# Patient Record
Sex: Female | Born: 1935 | Race: White | Hispanic: No | Smoking: Former smoker
Health system: Southern US, Community
[De-identification: ages and names within clinical notes are randomized; demographics above are authoritative.]

## PROBLEM LIST (undated history)

## (undated) DIAGNOSIS — K219 Gastro-esophageal reflux disease without esophagitis: Secondary | ICD-10-CM

## (undated) DIAGNOSIS — J438 Other emphysema: Secondary | ICD-10-CM

## (undated) DIAGNOSIS — J45909 Unspecified asthma, uncomplicated: Secondary | ICD-10-CM

## (undated) DIAGNOSIS — Z923 Personal history of irradiation: Secondary | ICD-10-CM

## (undated) DIAGNOSIS — R112 Nausea with vomiting, unspecified: Secondary | ICD-10-CM

## (undated) DIAGNOSIS — Z9889 Other specified postprocedural states: Secondary | ICD-10-CM

## (undated) DIAGNOSIS — J439 Emphysema, unspecified: Secondary | ICD-10-CM

## (undated) DIAGNOSIS — Z972 Presence of dental prosthetic device (complete) (partial): Secondary | ICD-10-CM

## (undated) DIAGNOSIS — Z9981 Dependence on supplemental oxygen: Secondary | ICD-10-CM

## (undated) DIAGNOSIS — M1990 Unspecified osteoarthritis, unspecified site: Secondary | ICD-10-CM

## (undated) DIAGNOSIS — M810 Age-related osteoporosis without current pathological fracture: Secondary | ICD-10-CM

## (undated) DIAGNOSIS — I10 Essential (primary) hypertension: Secondary | ICD-10-CM

## (undated) DIAGNOSIS — F419 Anxiety disorder, unspecified: Secondary | ICD-10-CM

## (undated) DIAGNOSIS — K08109 Complete loss of teeth, unspecified cause, unspecified class: Secondary | ICD-10-CM

## (undated) DIAGNOSIS — H269 Unspecified cataract: Secondary | ICD-10-CM

## (undated) DIAGNOSIS — F32A Depression, unspecified: Secondary | ICD-10-CM

## (undated) DIAGNOSIS — T7840XA Allergy, unspecified, initial encounter: Secondary | ICD-10-CM

## (undated) DIAGNOSIS — Z973 Presence of spectacles and contact lenses: Secondary | ICD-10-CM

## (undated) DIAGNOSIS — F329 Major depressive disorder, single episode, unspecified: Secondary | ICD-10-CM

## (undated) DIAGNOSIS — C801 Malignant (primary) neoplasm, unspecified: Secondary | ICD-10-CM

## (undated) DIAGNOSIS — C50919 Malignant neoplasm of unspecified site of unspecified female breast: Secondary | ICD-10-CM

## (undated) DIAGNOSIS — E785 Hyperlipidemia, unspecified: Secondary | ICD-10-CM

## (undated) HISTORY — DX: Emphysema, unspecified: J43.9

## (undated) HISTORY — PX: OTHER SURGICAL HISTORY: SHX169

## (undated) HISTORY — DX: Gastro-esophageal reflux disease without esophagitis: K21.9

## (undated) HISTORY — DX: Unspecified osteoarthritis, unspecified site: M19.90

## (undated) HISTORY — PX: PARTIAL HYSTERECTOMY: SHX80

## (undated) HISTORY — DX: Hyperlipidemia, unspecified: E78.5

## (undated) HISTORY — PX: BREAST SURGERY: SHX581

## (undated) HISTORY — DX: Unspecified asthma, uncomplicated: J45.909

## (undated) HISTORY — PX: ABDOMINAL HYSTERECTOMY: SHX81

## (undated) HISTORY — PX: CATARACT EXTRACTION: SUR2

## (undated) HISTORY — DX: Age-related osteoporosis without current pathological fracture: M81.0

## (undated) HISTORY — DX: Malignant (primary) neoplasm, unspecified: C80.1

## (undated) HISTORY — DX: Essential (primary) hypertension: I10

## (undated) HISTORY — DX: Other emphysema: J43.8

---

## 1991-09-24 HISTORY — PX: HIP SURGERY: SHX245

## 1996-07-01 ENCOUNTER — Other Ambulatory Visit: Payer: Self-pay

## 1997-04-26 ENCOUNTER — Encounter: Payer: Self-pay | Admitting: Pulmonary Disease

## 1998-02-21 ENCOUNTER — Ambulatory Visit (HOSPITAL_COMMUNITY): Admission: RE | Admit: 1998-02-21 | Discharge: 1998-02-21 | Payer: Self-pay | Admitting: Family Medicine

## 1998-03-06 ENCOUNTER — Other Ambulatory Visit: Admission: RE | Admit: 1998-03-06 | Discharge: 1998-03-06 | Payer: Self-pay | Admitting: Family Medicine

## 1998-04-05 ENCOUNTER — Ambulatory Visit (HOSPITAL_COMMUNITY): Admission: RE | Admit: 1998-04-05 | Discharge: 1998-04-05 | Payer: Self-pay | Admitting: Family Medicine

## 1999-02-26 ENCOUNTER — Other Ambulatory Visit: Admission: RE | Admit: 1999-02-26 | Discharge: 1999-02-26 | Payer: Self-pay | Admitting: Family Medicine

## 1999-06-06 ENCOUNTER — Other Ambulatory Visit: Admission: RE | Admit: 1999-06-06 | Discharge: 1999-06-06 | Payer: Self-pay | Admitting: Gynecology

## 1999-08-29 ENCOUNTER — Other Ambulatory Visit: Admission: RE | Admit: 1999-08-29 | Discharge: 1999-08-29 | Payer: Self-pay | Admitting: Gynecology

## 1999-10-29 ENCOUNTER — Other Ambulatory Visit: Admission: RE | Admit: 1999-10-29 | Discharge: 1999-10-29 | Payer: Self-pay | Admitting: Gynecology

## 1999-10-29 ENCOUNTER — Encounter (INDEPENDENT_AMBULATORY_CARE_PROVIDER_SITE_OTHER): Payer: Self-pay | Admitting: Specialist

## 2000-02-04 ENCOUNTER — Other Ambulatory Visit: Admission: RE | Admit: 2000-02-04 | Discharge: 2000-02-04 | Payer: Self-pay | Admitting: Gynecology

## 2000-03-10 ENCOUNTER — Encounter: Payer: Self-pay | Admitting: Family Medicine

## 2000-03-10 ENCOUNTER — Encounter: Admission: RE | Admit: 2000-03-10 | Discharge: 2000-03-10 | Payer: Self-pay | Admitting: Family Medicine

## 2001-03-13 ENCOUNTER — Encounter: Payer: Self-pay | Admitting: Internal Medicine

## 2001-03-13 ENCOUNTER — Encounter: Admission: RE | Admit: 2001-03-13 | Discharge: 2001-03-13 | Payer: Self-pay | Admitting: Internal Medicine

## 2002-03-16 ENCOUNTER — Encounter: Payer: Self-pay | Admitting: Family Medicine

## 2002-03-16 ENCOUNTER — Encounter: Admission: RE | Admit: 2002-03-16 | Discharge: 2002-03-16 | Payer: Self-pay | Admitting: Family Medicine

## 2002-03-29 ENCOUNTER — Encounter: Payer: Self-pay | Admitting: Family Medicine

## 2002-03-29 ENCOUNTER — Encounter: Admission: RE | Admit: 2002-03-29 | Discharge: 2002-03-29 | Payer: Self-pay | Admitting: Family Medicine

## 2002-03-30 ENCOUNTER — Other Ambulatory Visit: Admission: RE | Admit: 2002-03-30 | Discharge: 2002-03-30 | Payer: Self-pay | Admitting: Family Medicine

## 2003-03-18 ENCOUNTER — Encounter: Admission: RE | Admit: 2003-03-18 | Discharge: 2003-03-18 | Payer: Self-pay | Admitting: Family Medicine

## 2003-03-18 ENCOUNTER — Encounter: Payer: Self-pay | Admitting: Family Medicine

## 2003-04-07 ENCOUNTER — Other Ambulatory Visit: Admission: RE | Admit: 2003-04-07 | Discharge: 2003-04-07 | Payer: Self-pay | Admitting: Family Medicine

## 2004-03-12 ENCOUNTER — Encounter: Admission: RE | Admit: 2004-03-12 | Discharge: 2004-03-12 | Payer: Self-pay | Admitting: Family Medicine

## 2004-04-09 ENCOUNTER — Other Ambulatory Visit: Admission: RE | Admit: 2004-04-09 | Discharge: 2004-04-09 | Payer: Self-pay | Admitting: Family Medicine

## 2004-07-31 ENCOUNTER — Ambulatory Visit: Payer: Self-pay

## 2005-02-07 ENCOUNTER — Ambulatory Visit: Payer: Self-pay | Admitting: Pulmonary Disease

## 2005-02-12 ENCOUNTER — Ambulatory Visit: Payer: Self-pay

## 2005-04-05 ENCOUNTER — Encounter: Admission: RE | Admit: 2005-04-05 | Discharge: 2005-04-05 | Payer: Self-pay | Admitting: Family Medicine

## 2005-04-12 ENCOUNTER — Encounter: Admission: RE | Admit: 2005-04-12 | Discharge: 2005-04-12 | Payer: Self-pay | Admitting: Family Medicine

## 2005-05-01 ENCOUNTER — Other Ambulatory Visit: Admission: RE | Admit: 2005-05-01 | Discharge: 2005-05-01 | Payer: Self-pay | Admitting: Family Medicine

## 2005-05-01 ENCOUNTER — Encounter: Payer: Self-pay | Admitting: Family Medicine

## 2005-05-01 LAB — CONVERTED CEMR LAB: Pap Smear: NORMAL

## 2006-01-10 ENCOUNTER — Ambulatory Visit: Payer: Self-pay | Admitting: Pulmonary Disease

## 2006-02-07 ENCOUNTER — Encounter: Payer: Self-pay | Admitting: Pulmonary Disease

## 2006-02-14 ENCOUNTER — Ambulatory Visit: Payer: Self-pay | Admitting: Pulmonary Disease

## 2006-04-08 ENCOUNTER — Encounter: Admission: RE | Admit: 2006-04-08 | Discharge: 2006-04-08 | Payer: Self-pay | Admitting: Family Medicine

## 2006-04-11 ENCOUNTER — Encounter: Admission: RE | Admit: 2006-04-11 | Discharge: 2006-04-11 | Payer: Self-pay | Admitting: Family Medicine

## 2006-04-11 ENCOUNTER — Encounter (INDEPENDENT_AMBULATORY_CARE_PROVIDER_SITE_OTHER): Payer: Self-pay | Admitting: *Deleted

## 2006-04-11 ENCOUNTER — Encounter (INDEPENDENT_AMBULATORY_CARE_PROVIDER_SITE_OTHER): Payer: Self-pay | Admitting: Specialist

## 2006-04-11 ENCOUNTER — Encounter (INDEPENDENT_AMBULATORY_CARE_PROVIDER_SITE_OTHER): Payer: Self-pay | Admitting: Diagnostic Radiology

## 2006-04-16 ENCOUNTER — Ambulatory Visit: Payer: Self-pay | Admitting: Family Medicine

## 2006-05-14 ENCOUNTER — Encounter (INDEPENDENT_AMBULATORY_CARE_PROVIDER_SITE_OTHER): Payer: Self-pay | Admitting: Specialist

## 2006-05-14 ENCOUNTER — Ambulatory Visit (HOSPITAL_COMMUNITY): Admission: RE | Admit: 2006-05-14 | Discharge: 2006-05-15 | Payer: Self-pay | Admitting: General Surgery

## 2006-05-14 ENCOUNTER — Encounter: Admission: RE | Admit: 2006-05-14 | Discharge: 2006-05-14 | Payer: Self-pay | Admitting: General Surgery

## 2006-05-14 HISTORY — PX: MASTECTOMY: SHX3

## 2006-05-29 ENCOUNTER — Ambulatory Visit: Admission: RE | Admit: 2006-05-29 | Discharge: 2006-08-24 | Payer: Self-pay | Admitting: *Deleted

## 2006-05-29 ENCOUNTER — Ambulatory Visit: Payer: Self-pay | Admitting: Hematology & Oncology

## 2006-06-02 LAB — CBC WITH DIFFERENTIAL/PLATELET
Basophils Absolute: 0 10*3/uL (ref 0.0–0.1)
Eosinophils Absolute: 0.1 10*3/uL (ref 0.0–0.5)
HGB: 13.4 g/dL (ref 11.6–15.9)
LYMPH%: 16.2 % (ref 14.0–48.0)
MCV: 90.5 fL (ref 81.0–101.0)
MONO%: 4.5 % (ref 0.0–13.0)
NEUT#: 6.4 10*3/uL (ref 1.5–6.5)
Platelets: 257 10*3/uL (ref 145–400)

## 2006-06-02 LAB — COMPREHENSIVE METABOLIC PANEL
Alkaline Phosphatase: 74 U/L (ref 39–117)
BUN: 15 mg/dL (ref 6–23)
Creatinine, Ser: 0.8 mg/dL (ref 0.40–1.20)
Glucose, Bld: 110 mg/dL — ABNORMAL HIGH (ref 70–99)
Total Bilirubin: 0.3 mg/dL (ref 0.3–1.2)

## 2006-07-24 ENCOUNTER — Ambulatory Visit: Payer: Self-pay | Admitting: Pulmonary Disease

## 2006-08-01 ENCOUNTER — Ambulatory Visit: Payer: Self-pay | Admitting: Internal Medicine

## 2006-08-27 ENCOUNTER — Ambulatory Visit: Payer: Self-pay | Admitting: Family Medicine

## 2006-09-01 ENCOUNTER — Ambulatory Visit: Payer: Self-pay | Admitting: Hematology & Oncology

## 2006-09-03 LAB — COMPREHENSIVE METABOLIC PANEL
AST: 19 U/L (ref 0–37)
Alkaline Phosphatase: 71 U/L (ref 39–117)
BUN: 11 mg/dL (ref 6–23)
Glucose, Bld: 92 mg/dL (ref 70–99)
Sodium: 140 mEq/L (ref 135–145)
Total Bilirubin: 0.4 mg/dL (ref 0.3–1.2)
Total Protein: 6.3 g/dL (ref 6.0–8.3)

## 2006-09-03 LAB — CBC WITH DIFFERENTIAL/PLATELET
EOS%: 1.6 % (ref 0.0–7.0)
Eosinophils Absolute: 0.1 10*3/uL (ref 0.0–0.5)
LYMPH%: 17.7 % (ref 14.0–48.0)
MCH: 30.5 pg (ref 26.0–34.0)
MCV: 90.4 fL (ref 81.0–101.0)
MONO%: 7.3 % (ref 0.0–13.0)
NEUT#: 3.9 10*3/uL (ref 1.5–6.5)
Platelets: 186 10*3/uL (ref 145–400)
RBC: 4.27 10*6/uL (ref 3.70–5.32)

## 2006-10-23 ENCOUNTER — Ambulatory Visit: Payer: Self-pay | Admitting: Pulmonary Disease

## 2006-10-27 ENCOUNTER — Ambulatory Visit: Payer: Self-pay | Admitting: Family Medicine

## 2006-12-15 ENCOUNTER — Ambulatory Visit: Payer: Self-pay | Admitting: Hematology & Oncology

## 2006-12-17 LAB — CBC WITH DIFFERENTIAL/PLATELET
BASO%: 0.3 % (ref 0.0–2.0)
EOS%: 3 % (ref 0.0–7.0)
Eosinophils Absolute: 0.2 10*3/uL (ref 0.0–0.5)
HGB: 13.3 g/dL (ref 11.6–15.9)
LYMPH%: 21.2 % (ref 14.0–48.0)
MCV: 88.7 fL (ref 81.0–101.0)
NEUT#: 3.4 10*3/uL (ref 1.5–6.5)
NEUT%: 68.1 % (ref 39.6–76.8)
Platelets: 213 10*3/uL (ref 145–400)
RDW: 13.6 % (ref 11.3–14.5)

## 2006-12-17 LAB — COMPREHENSIVE METABOLIC PANEL
BUN: 13 mg/dL (ref 6–23)
CO2: 30 mEq/L (ref 19–32)
Creatinine, Ser: 0.67 mg/dL (ref 0.40–1.20)
Glucose, Bld: 78 mg/dL (ref 70–99)
Sodium: 139 mEq/L (ref 135–145)
Total Bilirubin: 0.5 mg/dL (ref 0.3–1.2)
Total Protein: 6.8 g/dL (ref 6.0–8.3)

## 2007-02-27 ENCOUNTER — Ambulatory Visit: Payer: Self-pay | Admitting: Pulmonary Disease

## 2007-03-09 ENCOUNTER — Ambulatory Visit: Payer: Self-pay | Admitting: Hematology & Oncology

## 2007-03-11 LAB — CBC WITH DIFFERENTIAL/PLATELET
Eosinophils Absolute: 0.1 10*3/uL (ref 0.0–0.5)
HGB: 13.2 g/dL (ref 11.6–15.9)
LYMPH%: 24 % (ref 14.0–48.0)
MONO#: 0.3 10*3/uL (ref 0.1–0.9)
NEUT#: 3.4 10*3/uL (ref 1.5–6.5)
Platelets: 202 10*3/uL (ref 145–400)
RBC: 4.28 10*6/uL (ref 3.70–5.32)
WBC: 5 10*3/uL (ref 3.9–10.0)

## 2007-03-11 LAB — COMPREHENSIVE METABOLIC PANEL
Albumin: 4.1 g/dL (ref 3.5–5.2)
CO2: 28 mEq/L (ref 19–32)
Glucose, Bld: 103 mg/dL — ABNORMAL HIGH (ref 70–99)
Potassium: 4.3 mEq/L (ref 3.5–5.3)
Sodium: 143 mEq/L (ref 135–145)
Total Protein: 6.4 g/dL (ref 6.0–8.3)

## 2007-04-10 ENCOUNTER — Encounter: Payer: Self-pay | Admitting: Family Medicine

## 2007-04-10 ENCOUNTER — Encounter: Admission: RE | Admit: 2007-04-10 | Discharge: 2007-04-10 | Payer: Self-pay | Admitting: Family Medicine

## 2007-04-10 DIAGNOSIS — M810 Age-related osteoporosis without current pathological fracture: Secondary | ICD-10-CM | POA: Insufficient documentation

## 2007-04-10 DIAGNOSIS — Z853 Personal history of malignant neoplasm of breast: Secondary | ICD-10-CM

## 2007-04-10 DIAGNOSIS — K219 Gastro-esophageal reflux disease without esophagitis: Secondary | ICD-10-CM | POA: Insufficient documentation

## 2007-04-10 DIAGNOSIS — E785 Hyperlipidemia, unspecified: Secondary | ICD-10-CM | POA: Insufficient documentation

## 2007-04-10 DIAGNOSIS — J45909 Unspecified asthma, uncomplicated: Secondary | ICD-10-CM | POA: Insufficient documentation

## 2007-04-13 ENCOUNTER — Encounter (INDEPENDENT_AMBULATORY_CARE_PROVIDER_SITE_OTHER): Payer: Self-pay | Admitting: *Deleted

## 2007-04-15 ENCOUNTER — Telehealth (INDEPENDENT_AMBULATORY_CARE_PROVIDER_SITE_OTHER): Payer: Self-pay | Admitting: *Deleted

## 2007-04-15 ENCOUNTER — Inpatient Hospital Stay (HOSPITAL_COMMUNITY): Admission: EM | Admit: 2007-04-15 | Discharge: 2007-04-24 | Payer: Self-pay | Admitting: Emergency Medicine

## 2007-04-15 ENCOUNTER — Ambulatory Visit: Payer: Self-pay | Admitting: Pulmonary Disease

## 2007-04-15 ENCOUNTER — Ambulatory Visit: Payer: Self-pay | Admitting: Internal Medicine

## 2007-04-20 ENCOUNTER — Encounter: Payer: Self-pay | Admitting: Family Medicine

## 2007-04-23 ENCOUNTER — Encounter: Payer: Self-pay | Admitting: Family Medicine

## 2007-04-24 ENCOUNTER — Encounter: Payer: Self-pay | Admitting: Family Medicine

## 2007-04-25 ENCOUNTER — Encounter: Payer: Self-pay | Admitting: Family Medicine

## 2007-05-01 ENCOUNTER — Ambulatory Visit: Payer: Self-pay | Admitting: Family Medicine

## 2007-05-01 DIAGNOSIS — I10 Essential (primary) hypertension: Secondary | ICD-10-CM | POA: Insufficient documentation

## 2007-05-05 ENCOUNTER — Ambulatory Visit: Payer: Self-pay | Admitting: Pulmonary Disease

## 2007-05-18 ENCOUNTER — Encounter: Payer: Self-pay | Admitting: Family Medicine

## 2007-06-16 ENCOUNTER — Ambulatory Visit: Payer: Self-pay | Admitting: Pulmonary Disease

## 2007-07-07 ENCOUNTER — Ambulatory Visit: Payer: Self-pay | Admitting: Hematology & Oncology

## 2007-07-10 ENCOUNTER — Encounter: Payer: Self-pay | Admitting: Family Medicine

## 2007-07-10 LAB — COMPREHENSIVE METABOLIC PANEL
Albumin: 3.9 g/dL (ref 3.5–5.2)
Alkaline Phosphatase: 75 U/L (ref 39–117)
CO2: 27 mEq/L (ref 19–32)
Glucose, Bld: 69 mg/dL — ABNORMAL LOW (ref 70–99)
Potassium: 4.3 mEq/L (ref 3.5–5.3)
Sodium: 143 mEq/L (ref 135–145)
Total Protein: 6.1 g/dL (ref 6.0–8.3)

## 2007-07-10 LAB — CBC WITH DIFFERENTIAL/PLATELET
Eosinophils Absolute: 0.1 10*3/uL (ref 0.0–0.5)
MONO#: 0.4 10*3/uL (ref 0.1–0.9)
NEUT#: 4.2 10*3/uL (ref 1.5–6.5)
RBC: 4.23 10*6/uL (ref 3.70–5.32)
RDW: 13.8 % (ref 11.3–14.5)
WBC: 5.9 10*3/uL (ref 3.9–10.0)

## 2007-08-04 ENCOUNTER — Ambulatory Visit: Payer: Self-pay | Admitting: Family Medicine

## 2007-08-04 DIAGNOSIS — R030 Elevated blood-pressure reading, without diagnosis of hypertension: Secondary | ICD-10-CM | POA: Insufficient documentation

## 2007-08-07 LAB — CONVERTED CEMR LAB
Albumin: 3.6 g/dL (ref 3.5–5.2)
BUN: 8 mg/dL (ref 6–23)
Calcium: 9.4 mg/dL (ref 8.4–10.5)
Chloride: 104 meq/L (ref 96–112)
Creatinine, Ser: 0.6 mg/dL (ref 0.4–1.2)
Glucose, Bld: 88 mg/dL (ref 70–99)
HDL: 57 mg/dL (ref >39.0)
LDL Cholesterol: 73 mg/dL (ref 0–99)
Phosphorus: 3.7 mg/dL (ref 2.3–4.6)
Sodium: 140 meq/L (ref 135–145)
Triglycerides: 80 mg/dL (ref 0–149)

## 2007-10-16 ENCOUNTER — Telehealth: Payer: Self-pay | Admitting: Pulmonary Disease

## 2007-10-20 ENCOUNTER — Ambulatory Visit: Payer: Self-pay | Admitting: Pulmonary Disease

## 2007-11-04 ENCOUNTER — Ambulatory Visit: Payer: Self-pay | Admitting: Hematology & Oncology

## 2007-11-06 LAB — CBC AND DIFFERENTIAL
WBC: 6.6 10*3/mL
WBC: 6.6 10^3/mL
WBC: 6.6 10^3/mL

## 2007-11-06 LAB — CBC WITH DIFFERENTIAL/PLATELET
Basophils Absolute: 0 10*3/uL (ref 0.0–0.1)
Eosinophils Absolute: 0.1 10*3/uL (ref 0.0–0.5)
HGB: 13.2 g/dL (ref 11.6–15.9)
MCV: 88.6 fL (ref 81.0–101.0)
MONO#: 0.3 10*3/uL (ref 0.1–0.9)
NEUT#: 5.2 10*3/uL (ref 1.5–6.5)
Platelets: 203 10*3/uL (ref 145–400)
RBC: 4.68 10*6/uL (ref 3.70–5.32)
RDW: 14.2 % (ref 11.3–14.5)
WBC: 6.6 10*3/uL (ref 3.9–10.0)

## 2007-11-06 LAB — COMPREHENSIVE METABOLIC PANEL
ALT: 13 U/L (ref 0–35)
AST: 16 U/L (ref 0–37)
Albumin: 4 g/dL (ref 3.5–5.2)
Alkaline Phosphatase: 80 U/L (ref 39–117)
Glucose, Bld: 94 mg/dL (ref 70–99)
Potassium: 4.2 mEq/L (ref 3.5–5.3)
Sodium: 141 mEq/L (ref 135–145)
Total Bilirubin: 0.4 mg/dL (ref 0.3–1.2)
Total Protein: 6.3 g/dL (ref 6.0–8.3)

## 2007-12-28 ENCOUNTER — Telehealth: Payer: Self-pay | Admitting: Family Medicine

## 2008-03-09 ENCOUNTER — Ambulatory Visit: Payer: Self-pay | Admitting: Pulmonary Disease

## 2008-03-09 DIAGNOSIS — J439 Emphysema, unspecified: Secondary | ICD-10-CM | POA: Insufficient documentation

## 2008-04-03 IMAGING — CR DG CHEST 2V
2 series · 2 of 2 positions shown · non-contrast
Comparison: 04/22/07.

CLINICAL DATA: COPD.
 CHEST - 2 VIEW:

[view not recorded (1 of 2)]
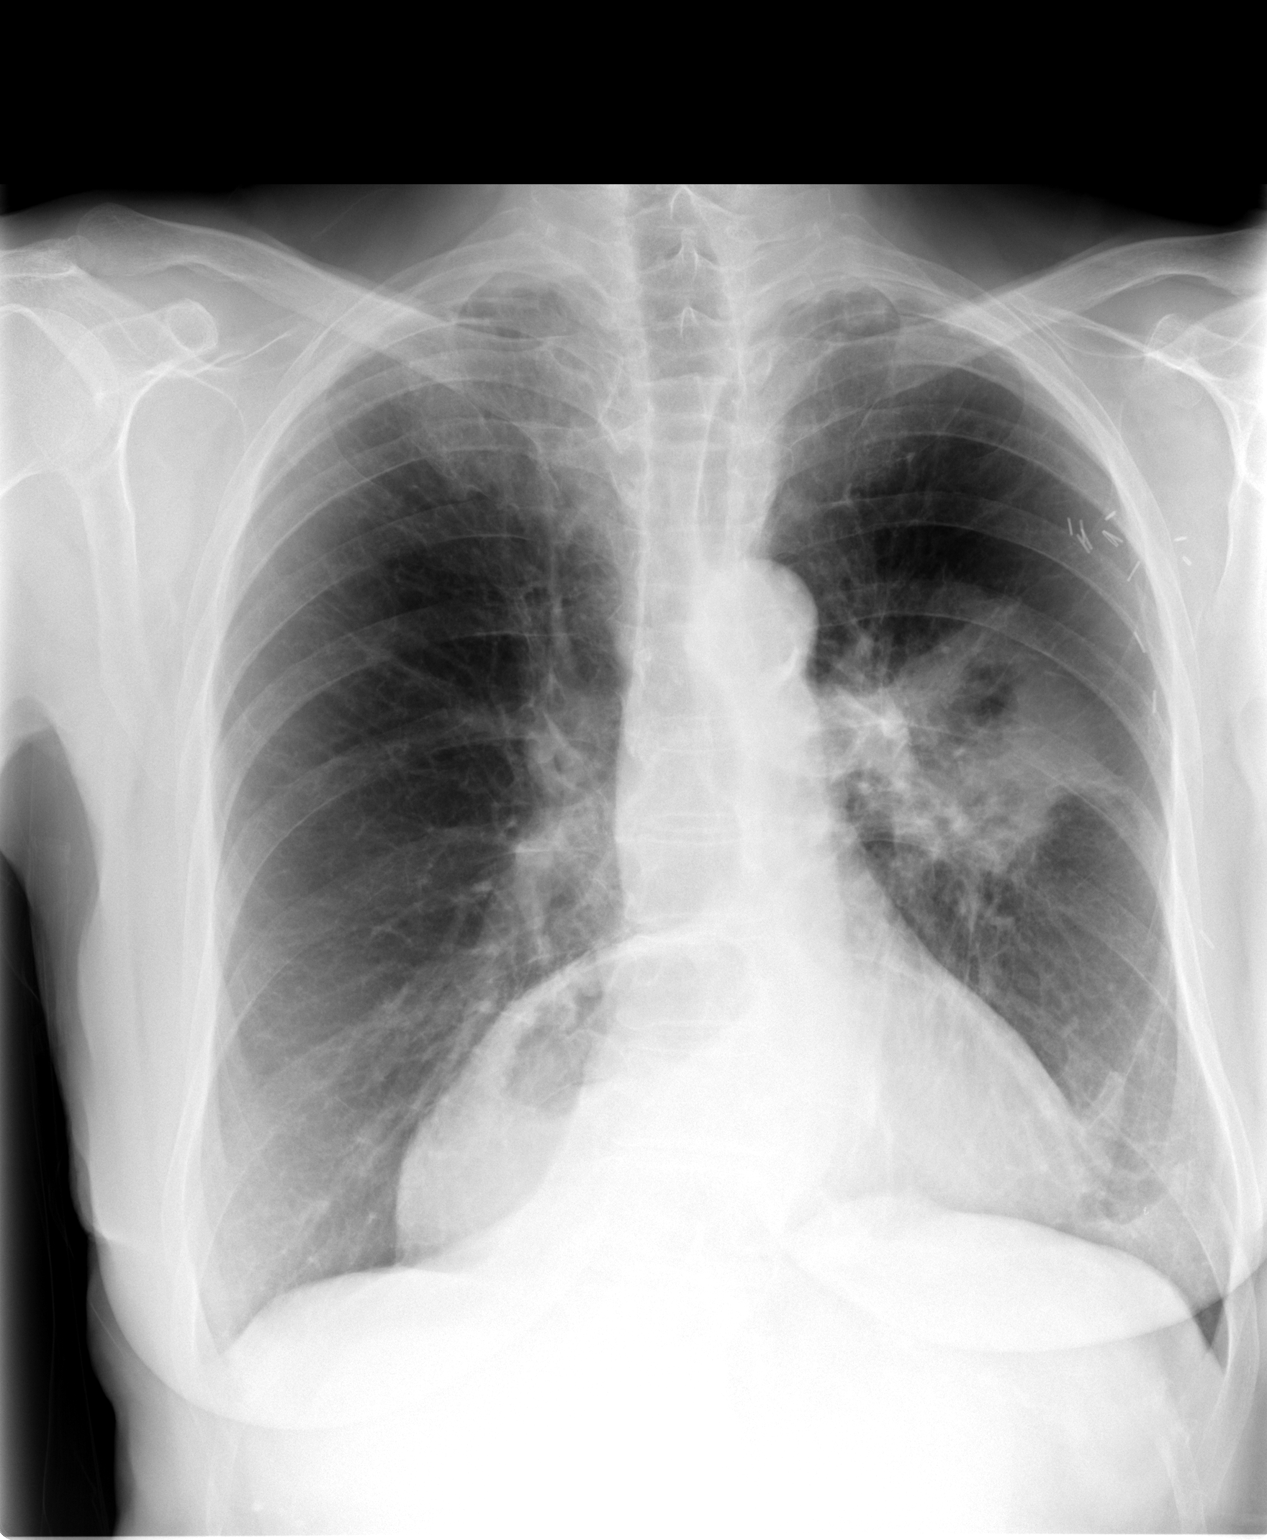

[view not recorded (2 of 2)]
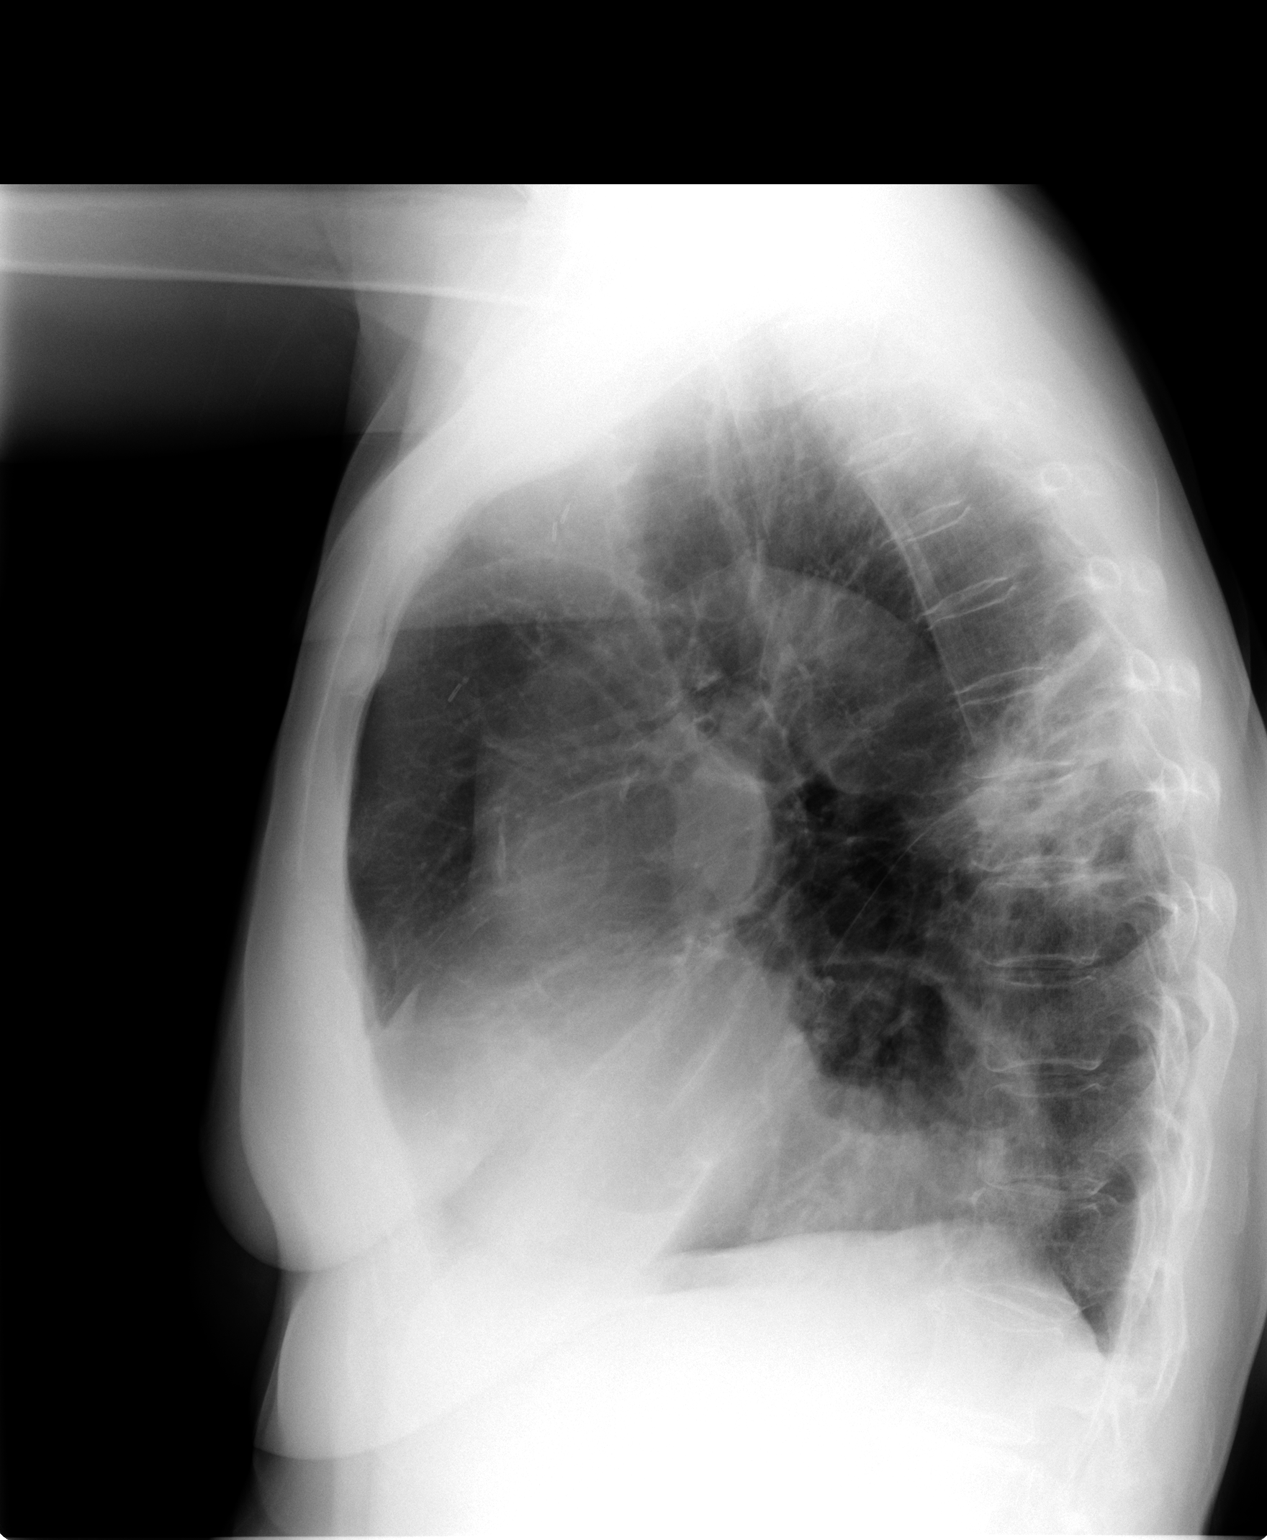

[2 of 2 positions shown; findings below may reference images not displayed]

FINDINGS: Cavitary infiltrate in the superior segment of the left lower lobe is again noted.  The cavity is more prominent and the infiltrate is slightly improved.   The infiltrate in the left lower lobe basilar segment in the posterior lung base has resolved in the interval.  There is no significant effusion.  There is chronic lung disease. Intrathoracic stomach is again noted.
IMPRESSION: Improvement in left lower lobe infiltrate since the prior study. The infiltrate in the superior segment of the left lower lobe shows slightly more cavitation.

## 2008-04-12 ENCOUNTER — Encounter: Admission: RE | Admit: 2008-04-12 | Discharge: 2008-04-12 | Payer: Self-pay | Admitting: Family Medicine

## 2008-04-21 ENCOUNTER — Telehealth (INDEPENDENT_AMBULATORY_CARE_PROVIDER_SITE_OTHER): Payer: Self-pay | Admitting: *Deleted

## 2008-04-22 ENCOUNTER — Ambulatory Visit: Payer: Self-pay | Admitting: Internal Medicine

## 2008-04-22 DIAGNOSIS — J209 Acute bronchitis, unspecified: Secondary | ICD-10-CM

## 2008-05-04 ENCOUNTER — Ambulatory Visit: Payer: Self-pay | Admitting: Hematology & Oncology

## 2008-05-06 LAB — CBC WITH DIFFERENTIAL (CANCER CENTER ONLY)
BASO#: 0 10*3/uL (ref 0.0–0.2)
BASO%: 0.3 % (ref 0.0–2.0)
HCT: 38.4 % (ref 34.8–46.6)
HGB: 13.2 g/dL (ref 11.6–15.9)
LYMPH%: 19.3 % (ref 14.0–48.0)
MCV: 89 fL (ref 81–101)
MONO#: 0.4 10*3/uL (ref 0.1–0.9)
NEUT%: 72.1 % (ref 39.6–80.0)
RDW: 12.7 % (ref 10.5–14.6)
WBC: 7.1 10*3/uL (ref 3.9–10.0)

## 2008-05-06 LAB — COMPREHENSIVE METABOLIC PANEL
Alkaline Phosphatase: 75 U/L (ref 39–117)
BUN: 9 mg/dL (ref 6–23)
CO2: 28 mEq/L (ref 19–32)
Glucose, Bld: 98 mg/dL (ref 70–99)
Sodium: 141 mEq/L (ref 135–145)
Total Bilirubin: 0.4 mg/dL (ref 0.3–1.2)
Total Protein: 6.4 g/dL (ref 6.0–8.3)

## 2008-05-23 ENCOUNTER — Telehealth (INDEPENDENT_AMBULATORY_CARE_PROVIDER_SITE_OTHER): Payer: Self-pay | Admitting: *Deleted

## 2008-06-03 ENCOUNTER — Encounter: Payer: Self-pay | Admitting: Pulmonary Disease

## 2008-06-12 ENCOUNTER — Encounter: Payer: Self-pay | Admitting: Pulmonary Disease

## 2008-06-15 ENCOUNTER — Ambulatory Visit: Payer: Self-pay | Admitting: Pulmonary Disease

## 2008-09-08 ENCOUNTER — Ambulatory Visit: Payer: Self-pay | Admitting: Pulmonary Disease

## 2008-09-26 ENCOUNTER — Telehealth (INDEPENDENT_AMBULATORY_CARE_PROVIDER_SITE_OTHER): Payer: Self-pay | Admitting: *Deleted

## 2008-10-14 ENCOUNTER — Ambulatory Visit: Payer: Self-pay | Admitting: Pulmonary Disease

## 2008-10-14 DIAGNOSIS — J690 Pneumonitis due to inhalation of food and vomit: Secondary | ICD-10-CM | POA: Insufficient documentation

## 2008-10-14 DIAGNOSIS — R059 Cough, unspecified: Secondary | ICD-10-CM | POA: Insufficient documentation

## 2008-10-14 DIAGNOSIS — R05 Cough: Secondary | ICD-10-CM

## 2008-11-02 ENCOUNTER — Ambulatory Visit: Payer: Self-pay | Admitting: Hematology & Oncology

## 2009-03-14 ENCOUNTER — Ambulatory Visit: Payer: Self-pay | Admitting: Pulmonary Disease

## 2009-04-14 ENCOUNTER — Encounter: Admission: RE | Admit: 2009-04-14 | Discharge: 2009-04-14 | Payer: Self-pay | Admitting: Internal Medicine

## 2009-05-09 ENCOUNTER — Ambulatory Visit: Payer: Self-pay | Admitting: Hematology & Oncology

## 2009-05-10 LAB — CBC WITH DIFFERENTIAL (CANCER CENTER ONLY)
Eosinophils Absolute: 0.2 10*3/uL (ref 0.0–0.5)
HCT: 38.1 % (ref 34.8–46.6)
LYMPH%: 26.1 % (ref 14.0–48.0)
MCV: 89 fL (ref 81–101)
MONO#: 0.3 10*3/uL (ref 0.1–0.9)
NEUT%: 65.1 % (ref 39.6–80.0)
RBC: 4.27 10*6/uL (ref 3.70–5.32)
WBC: 5.4 10*3/uL (ref 3.9–10.0)

## 2009-05-11 LAB — COMPREHENSIVE METABOLIC PANEL
ALT: 16 U/L (ref 0–35)
AST: 17 U/L (ref 0–37)
Albumin: 4 g/dL (ref 3.5–5.2)
Alkaline Phosphatase: 67 U/L (ref 39–117)
Potassium: 4.3 mEq/L (ref 3.5–5.3)
Sodium: 138 mEq/L (ref 135–145)
Total Bilirubin: 0.4 mg/dL (ref 0.3–1.2)
Total Protein: 6.2 g/dL (ref 6.0–8.3)

## 2009-06-15 ENCOUNTER — Telehealth: Payer: Self-pay | Admitting: Pulmonary Disease

## 2009-07-20 ENCOUNTER — Ambulatory Visit: Payer: Self-pay | Admitting: Pulmonary Disease

## 2009-07-20 DIAGNOSIS — J441 Chronic obstructive pulmonary disease with (acute) exacerbation: Secondary | ICD-10-CM

## 2009-09-12 ENCOUNTER — Ambulatory Visit: Payer: Self-pay | Admitting: Pulmonary Disease

## 2009-11-07 ENCOUNTER — Ambulatory Visit: Payer: Self-pay | Admitting: Hematology & Oncology

## 2009-11-09 LAB — COMPREHENSIVE METABOLIC PANEL
ALT: 19 U/L (ref 0–35)
AST: 20 U/L (ref 0–37)
Albumin: 4.2 g/dL (ref 3.5–5.2)
CO2: 28 mEq/L (ref 19–32)
Calcium: 9 mg/dL (ref 8.4–10.5)
Chloride: 104 mEq/L (ref 96–112)
Potassium: 4.1 mEq/L (ref 3.5–5.3)
Total Protein: 6.3 g/dL (ref 6.0–8.3)

## 2009-11-09 LAB — CBC WITH DIFFERENTIAL (CANCER CENTER ONLY)
BASO%: 0.2 % (ref 0.0–2.0)
EOS%: 2.2 % (ref 0.0–7.0)
HGB: 13.5 g/dL (ref 11.6–15.9)
LYMPH#: 1.4 10*3/uL (ref 0.9–3.3)
MCHC: 33.4 g/dL (ref 32.0–36.0)
MONO#: 0.2 10*3/uL (ref 0.1–0.9)
NEUT#: 5.4 10*3/uL (ref 1.5–6.5)
RDW: 11.7 % (ref 10.5–14.6)
WBC: 7.2 10*3/uL (ref 3.9–10.0)

## 2010-01-16 ENCOUNTER — Ambulatory Visit: Payer: Self-pay | Admitting: Pulmonary Disease

## 2010-03-22 ENCOUNTER — Ambulatory Visit: Payer: Self-pay | Admitting: Pulmonary Disease

## 2010-03-29 ENCOUNTER — Telehealth (INDEPENDENT_AMBULATORY_CARE_PROVIDER_SITE_OTHER): Payer: Self-pay | Admitting: *Deleted

## 2010-03-30 ENCOUNTER — Ambulatory Visit: Payer: Self-pay | Admitting: Pulmonary Disease

## 2010-03-30 DIAGNOSIS — R0902 Hypoxemia: Secondary | ICD-10-CM | POA: Insufficient documentation

## 2010-04-09 ENCOUNTER — Ambulatory Visit: Payer: Self-pay | Admitting: Hematology & Oncology

## 2010-04-10 ENCOUNTER — Telehealth: Payer: Self-pay | Admitting: Pulmonary Disease

## 2010-05-03 LAB — VITAMIN D 25 HYDROXY (VIT D DEFICIENCY, FRACTURES): Vit D, 25-Hydroxy: 73 ng/mL (ref 30–89)

## 2010-05-03 LAB — CBC WITH DIFFERENTIAL (CANCER CENTER ONLY)
BASO%: 0.5 % (ref 0.0–2.0)
EOS%: 3 % (ref 0.0–7.0)
Eosinophils Absolute: 0.2 10*3/uL (ref 0.0–0.5)
HCT: 41.2 % (ref 34.8–46.6)
HGB: 13.8 g/dL (ref 11.6–15.9)
LYMPH#: 1.5 10*3/uL (ref 0.9–3.3)
LYMPH%: 27.5 % (ref 14.0–48.0)
MCV: 90 fL (ref 81–101)
MONO%: 5.5 % (ref 0.0–13.0)
NEUT%: 63.5 % (ref 39.6–80.0)
Platelets: 250 10*3/uL (ref 145–400)
RBC: 4.58 10*6/uL (ref 3.70–5.32)
RDW: 11.8 % (ref 10.5–14.6)

## 2010-05-03 LAB — COMPREHENSIVE METABOLIC PANEL
ALT: 16 U/L (ref 0–35)
BUN: 13 mg/dL (ref 6–23)
Chloride: 103 mEq/L (ref 96–112)
Creatinine, Ser: 0.74 mg/dL (ref 0.40–1.20)
Glucose, Bld: 87 mg/dL (ref 70–99)
Potassium: 4.2 mEq/L (ref 3.5–5.3)
Total Protein: 6.7 g/dL (ref 6.0–8.3)

## 2010-05-09 ENCOUNTER — Encounter: Admission: RE | Admit: 2010-05-09 | Discharge: 2010-05-09 | Payer: Self-pay | Admitting: Internal Medicine

## 2010-05-24 ENCOUNTER — Encounter (HOSPITAL_COMMUNITY): Admission: RE | Admit: 2010-05-24 | Discharge: 2010-06-22 | Payer: Self-pay | Admitting: Pulmonary Disease

## 2010-06-13 ENCOUNTER — Ambulatory Visit: Payer: Self-pay | Admitting: Pulmonary Disease

## 2010-06-23 ENCOUNTER — Encounter (HOSPITAL_COMMUNITY)
Admission: RE | Admit: 2010-06-23 | Discharge: 2010-09-21 | Payer: Self-pay | Source: Home / Self Care | Attending: Pulmonary Disease | Admitting: Pulmonary Disease

## 2010-07-17 ENCOUNTER — Telehealth (INDEPENDENT_AMBULATORY_CARE_PROVIDER_SITE_OTHER): Payer: Self-pay | Admitting: *Deleted

## 2010-07-23 ENCOUNTER — Telehealth (INDEPENDENT_AMBULATORY_CARE_PROVIDER_SITE_OTHER): Payer: Self-pay | Admitting: *Deleted

## 2010-08-03 ENCOUNTER — Telehealth (INDEPENDENT_AMBULATORY_CARE_PROVIDER_SITE_OTHER): Payer: Self-pay | Admitting: *Deleted

## 2010-08-20 ENCOUNTER — Telehealth: Payer: Self-pay | Admitting: Pulmonary Disease

## 2010-09-20 ENCOUNTER — Ambulatory Visit: Payer: Self-pay | Admitting: Pulmonary Disease

## 2010-09-28 ENCOUNTER — Encounter: Payer: Self-pay | Admitting: Pulmonary Disease

## 2010-10-23 NOTE — Assessment & Plan Note (Signed)
Summary: flu shot/mhh  Nurse Visit   Allergies: 1)  * Avapro 2)  * Codiene  Immunizations Administered:  Influenza Vaccine # 1:    Vaccine Type: Fluvax MCR    Site: right deltoid    Mfr: GlaxoSmithKline    Dose: 0.5 ml    Route: IM    Given by: Kandice Hams CMA    Exp. Date: 03/23/2011    Lot #: [id number]    VIS given: 04/17/10 version given June 13, 2010.  Flu Vaccine Consent Questions:    Do you have a history of severe allergic reactions to this vaccine? no    Any prior history of allergic reactions to egg and/or gelatin? no    Do you have a sensitivity to the preservative Thimersol? no    Do you have a past history of Guillan-Barre Syndrome? no    Do you currently have an acute febrile illness? no    Have you ever had a severe reaction to latex? no    Vaccine information given and explained to patient? yes    Are you currently pregnant? no  Orders Added: 1)  Influenza Vaccine MCR [00025]

## 2010-10-23 NOTE — Progress Notes (Signed)
Summary: note for excercise  Phone Note Call from Patient Call back at Home Phone [phone]   Caller: Patient Call For: clance Summary of Call: pt needs a note on a rx pad stating that it's ok for her to use stationery bike as well as treadmill and other excercise equip at [facility]. pls mail to her home address.  Initial call taken by: Tivis Ringer, CNA,  July 23, 2010 9:11 AM  Follow-up for Phone Call        LMTCBx1 to get more info on exactly what rx needs to say. Carron Curie CMA  July 23, 2010 10:04 AM   returned call.Lacinda Axon  July 23, 2010 1:26 PM  LMTCBx2. Carron Curie CMA  July 23, 2010 2:15 PM  called spoke with patient who states that she was told by [facility] that the city lawyer advised them to get a written rx stating that pt may use the equipment at their facility.  she states that she does not need help paying for the monthly dues, but they will not let her use their gym without this.  KC, is this okay to write for pt?  would like rx mailed to her verified home address.  no call back necessary unless not okay with KC.   Follow-up by: Boone Master CNA/MA,  July 23, 2010 2:26 PM  Additional Follow-up for Phone Call Additional follow up Details #1::        done and put in bin Additional Follow-up by: [name] MD,  July 23, 2010 5:37 PM    Additional Follow-up for Phone Call Additional follow up Details #2::    Spoke with pt and notified note is done.  Note was mailed to her- home address was verified.  Follow-up by: Vernie Murders,  July 23, 2010 5:46 PM

## 2010-10-23 NOTE — Assessment & Plan Note (Signed)
Summary: green mucus, SOB / KC pt / cj   Primary Provider/Referring Provider:  Eula Listen  CC:  Acute visit. Dr. Shelle Iron patient. The patient c/o increased sob with exertion and sometimes at rest. Cough with green mucus x3 days.Marland Kitchen  History of Present Illness: 77 yo female with Severe COPD/Emphysema for acute visit.  She was seen by Dr. Shelle Iron on June 30, and was in her usual state of health.  A few days after this visit she started getting more cough and chest congestion.  She has been bringing up green sputum.  She denies hemoptysis or fever.  She has been getting occasional wheezing, and feels it is hard to get air into her lungs at times.  She denies sinus congestion, but has been getting headaches.  She is using her nebulizer more, and now using two times a day.  She does not use her proventil.  She doesn't think this helps much.  She denies skin rash, abdominal pain, gland swelling, or leg swelling.   Preventive Screening-Counseling & Management  Alcohol-Tobacco     Smoking Status: quit     Year Quit: 1997     Pack years: 23yrs, 1.5ppd  Current Medications (verified): 1)  Omeprazole 20 Mg  Cpdr (Omeprazole) .... Take One By Mouth Daily 2)  Lipitor 10 Mg  Tabs (Atorvastatin Calcium) .... Take One By Mouth Daily 3)  Altace 10 Mg  Tabs (Ramipril) .... Take One By Mouth Daily 4)  Spiriva Handihaler 18 Mcg  Caps (Tiotropium Bromide Monohydrate) .... Inhale Contents of 1 Capsule Once A Day 5)  Adult Aspirin Low Strength 81 Mg  Tbdp (Aspirin) .... Take One By Mouth Daily 6)  Caltrate 600+d Plus 600-400 Mg-Unit  Tabs (Calcium Carbonate-Vit D-Min) .... One By Mouth Qd 7)  Advair Diskus 250-50 Mcg/dose Misc (Fluticasone-Salmeterol) .... Inhale 1 Puff As Directed Twice A Day 8)  Proventil Hfa 108 (90 Base) Mcg/act  Aers (Albuterol Sulfate) .... Inhale 2 Puffs Every 4 To 6 Hours As Needed 9)  Albuterol Sulfate (2.5 Mg/93ml) 0.083% Nebu (Albuterol Sulfate) .Marland Kitchen.. 1 Vial in Nebulizer Every 6 Hours As  Needed 10)  Fish Oil 300 Mg Caps (Omega-3 Fatty Acids) .... Take 1 Tablet By Mouth Once A Day 11)  Amlodipine Besylate 5 Mg Tabs (Amlodipine Besylate) .... Take 1 Tablet By Mouth Once A Day 12)  Vitamin D 1000 Unit Tabs (Cholecalciferol) .... Take 1 Tablet By Mouth Once A Day  Allergies (verified): 1)  * Avapro 2)  * Codiene  Past History:  Past Medical History: Reviewed history from 04/22/2008 and no changes required. EMPHYSEMA (ICD-492.8) HYPERTENSION, WHITE COAT (ICD-796.2) HYPERTENSION, ESSENTIAL NOS (ICD-401.9) OSTEOPOROSIS (ICD-733.00) HYPERLIPIDEMIA (ICD-272.4) GERD (ICD-530.81) BREAST CANCER, HX OF (ICD-V10.3) ASTHMA (ICD-493.90)    Past Surgical History: Reviewed history from 05/01/2007 and no changes required. Cataract extraction- bilateral (2002) Hysterectomy- partial, fibroids- transfused (1982) Mastectomy- left partial, lymph node dissection (04/2006) Hip fracture- surgery (1994) Colonoscopy- hemorrhoids (2001) Dexa hosp pneumonia 7/08  Vital Signs:  Patient profile:   77 year old female Height:      66 inches (167.64 cm) Weight:      150 pounds (68.18 kg) BMI:     24.30 O2 Sat:      86 % on Room air Temp:     98.5 degrees F (36.94 degrees C) oral Pulse rate:   106 / minute BP sitting:   112 / 70  (right arm) Cuff size:   regular  Vitals Entered By: Michel Bickers CMA (March 30, 2010 3:14 PM)  O2 Sat at Rest %:  86 O2 Flow:  Room air  O2 Sat Comments Patients sats were at 86% on room air upon walking from the lobby to the exam room. After resting for 1 minute her sats came up to 91% on room air.Michel Bickers CMA  March 30, 2010 3:15 PM CC: Acute visit. Dr. Shelle Iron patient. The patient c/o increased sob with exertion and sometimes at rest. Cough with green mucus x3 days. Comments Medications reviewed. Daytime phone verified. Michel Bickers CMA  March 30, 2010 3:15 PM   Physical Exam  Nose:  no sinus tenderness, no drainage Mouth:  mild erythema, no  exudate Neck:  no JVD.   Lungs:  prolonged exhalation, b/l faint wheeze, scattered rhonchi, no rales Heart:  regular rhythm, normal rate, and no murmurs.   Abdomen:  thin, soft, non-tender Extremities:  no edema Cervical Nodes:  no significant adenopathy   Impression & Recommendations:  Problem # 1:  CHRONIC OBSTRUCTIVE PULMONARY DISEASE, ACUTE EXACERBATION (ICD-491.21) Will give 7 days of levaquin and prednisone taper.  She is to call if her symptoms get worse, or go to the ER.  Problem # 2:  HYPOXEMIA (ICD-799.02) Explained to her that she may need supplemental oxygen with exertion and at night.  She believes her breathing will be fine once her bronchitis clears up, and she does not believe she will need oxygen.  She is to f/u with Dr. Shelle Iron to discuss further.  I explained to her the reason for using supplemental oxygen, and how chronic hypoxemia (even if intermittent) can have detrimental health effects.  Medications Added to Medication List This Visit: 1)  Prednisone 10 Mg Tabs (Prednisone) .... 3 pills for 2 days, 2 pills for 2 days, 1 pill for 2 days, 1/2 pill for 2 days 2)  Levaquin 750 Mg Tabs (Levofloxacin) .... One by mouth once daily  Complete Medication List: 1)  Omeprazole 20 Mg Cpdr (Omeprazole) .... Take one by mouth daily 2)  Lipitor 10 Mg Tabs (Atorvastatin calcium) .... Take one by mouth daily 3)  Altace 10 Mg Tabs (Ramipril) .... Take one by mouth daily 4)  Spiriva Handihaler 18 Mcg Caps (Tiotropium bromide monohydrate) .... Inhale contents of 1 capsule once a day 5)  Adult Aspirin Low Strength 81 Mg Tbdp (Aspirin) .... Take one by mouth daily 6)  Caltrate 600+d Plus 600-400 Mg-unit Tabs (Calcium carbonate-vit d-min) .... One by mouth qd 7)  Advair Diskus 250-50 Mcg/dose Misc (Fluticasone-salmeterol) .... Inhale 1 puff as directed twice a day 8)  Proventil Hfa 108 (90 Base) Mcg/act Aers (Albuterol sulfate) .... Inhale 2 puffs every 4 to 6 hours as needed 9)   Albuterol Sulfate (2.5 Mg/31ml) 0.083% Nebu (Albuterol sulfate) .Marland Kitchen.. 1 vial in nebulizer every 6 hours as needed 10)  Fish Oil 300 Mg Caps (Omega-3 fatty acids) .... Take 1 tablet by mouth once a day 11)  Amlodipine Besylate 5 Mg Tabs (Amlodipine besylate) .... Take 1 tablet by mouth once a day 12)  Vitamin D 1000 Unit Tabs (Cholecalciferol) .... Take 1 tablet by mouth once a day 13)  Prednisone 10 Mg Tabs (Prednisone) .... 3 pills for 2 days, 2 pills for 2 days, 1 pill for 2 days, 1/2 pill for 2 days 14)  Levaquin 750 Mg Tabs (Levofloxacin) .... One by mouth once daily  Other Orders: Est. Patient Level IV ([address])  Patient Instructions: 1)  Levaquin 750 mg once daily for 7 days  2)  Prednisone 10 mg pills: 3 pills for 2 days, 2 pills for 2 days, 1 pill for 2 days, 1/2 pill for 2 days 3)  Follow up with Dr. Shelle Iron in 4 to 6 weeks Prescriptions: LEVAQUIN 750 MG TABS (LEVOFLOXACIN) one by mouth once daily  #7 x 0   Entered and Authorized by:   Coralyn Helling MD   Signed by:   Coralyn Helling MD on 03/30/2010   Method used:   [facility]  [place] [id number]       [address]       [place], [place]  [address]       Ph: [phone] or [phone]       Fax: [phone]   RxID:   [id number] PREDNISONE 10 MG TABS (PREDNISONE) 3 pills for 2 days, 2 pills for 2 days, 1 pill for 2 days, 1/2 pill for 2 days  #13 x 0   Entered and Authorized by:   Coralyn Helling MD   Signed by:   Coralyn Helling MD on 03/30/2010   Method used:   [facility]        [facility]  [place] ([facility]ail[facility]       [address]       [place], [place]  [address]       Ph: [phone] or [phone]       Fax: [phone]   RxID:   [id number]

## 2010-10-23 NOTE — Progress Notes (Signed)
Summary: ORDER to pulm rehab  Phone Note Call from Patient   Caller: Patient Call For: [facility] Summary of Call: NEED ORDER FAXED TO PULMONARY REHAB BEFORE PT CAN START. Initial call taken by: Rickard Patience,  April 10, 2010 1:21 PM  Follow-up for Phone Call        called and spoke with pt.  pt states when she last saw [facility] on 03-22-2010 he mentioned her trying pulmonary rehab. (see patient instructions for that OV) Pt would like to try pulm rehab and [facility].  Will forward message to [facility] so he can send order to [facility].  Arman Filter LPN  April 10, 2010 1:57 PM   Additional Follow-up for Phone Call Additional follow up Details #1::        order sent to [facility] Additional Follow-up by: [name] MD,  April 12, 2010 8:50 AM

## 2010-10-23 NOTE — Progress Notes (Signed)
Summary: pulmonary rehab issue   Phone Note Call from Patient Call back at Home Phone [phone]   Caller: Patient Call For: clance Summary of Call: Pt states she went to pulmonary in Sept and Oct wants to quit, re: no money and her car is being repaired, wants to know if it's ok to stop, pls advise. Initial call taken by: Darletta Moll,  July 17, 2010 3:59 PM  Follow-up for Phone Call        called spoke with patient who states that she has been to pulm rehab x2 months (sept and oct) and simply cannot afford to go for the 3rd month of november d/t vehicle repairs, the upcoming holidays, etc.  pt states that she has bought an oximeter and plans to implement the therapies she learned at rehab in her local senior center.  would like to have KC's thoughts on this, preferably before Thursday when she is scheduled to go to rehab again.  KC please advise, thanks! Follow-up by: Boone Master CNA/MA,  July 17, 2010 4:12 PM  Additional Follow-up for Phone Call Additional follow up Details #1::        I support her approach if this is a financial issue for her. Additional Follow-up by: [name] MD,  July 19, 2010 10:37 AM    Additional Follow-up for Phone Call Additional follow up Details #2::    LMTCBx1 to advise per [facility] ok to stop rehab and do exercise at senior center. Carron Curie CMA  July 19, 2010 10:44 AM pt returned call from triage. she is at home now. Tivis Ringer, CNA  July 19, 2010 2:21 PM  Spoke with pt and notified okay per [facility] to stop pulm rehab and exercise at the senior center. Pt verbalized understanding. Follow-up by: Vernie Murders,  July 19, 2010 2:24 PM

## 2010-10-23 NOTE — Progress Notes (Signed)
Summary: appt  Phone Note Call from Patient Call back at Home Phone [phone]   Caller: Patient Call For: clance Summary of Call: Coughing up green phlegm since yesterday, feels worse today, wants to see [facility] tomorrow pls advise. Initial call taken by: Darletta Moll,  March 29, 2010 3:50 PM  Follow-up for Phone Call        Called, spoke with pt.  Pt c/o "spitting up green" mucus, increased SOB, HA, and "not feeling good all over" x 1 1/2 days.  Requesting to be seen tomorrow.  KC has no openings-ov scheduled with VS for tomorrow at 315-pt aware.  Gweneth Dimitri RN  March 29, 2010 4:02 PM  Follow-up by: Gweneth Dimitri RN,  March 29, 2010 4:02 PM

## 2010-10-23 NOTE — Assessment & Plan Note (Signed)
Summary: acute sick visit for copd exacerbation   Primary Provider/Referring Provider:  Eula Listen  CC:  Emphysema.   The patient c/o cough with green mucus and increased sob with exertion and at rest x5 days. She is using her nebulizer more often.Marland Kitchen  History of Present Illness: the pt comes in today for an acute sick visit.  She has known severe emphysema, and gives a 5 day h/o increasing chest congestion, cough with purulent mucus, wheezing, and worsening sob.  She denies any f/c/s.  She had been doing well on her current regimen until this time.  Since being sick, she has had to use her rescue nebulizer treatments more often.  Current Medications (verified): 1)  Omeprazole 20 Mg  Cpdr (Omeprazole) .... Take One By Mouth Daily 2)  Lipitor 10 Mg  Tabs (Atorvastatin Calcium) .... Take One By Mouth Daily 3)  Altace 10 Mg  Tabs (Ramipril) .... Take One By Mouth Daily 4)  Spiriva Handihaler 18 Mcg  Caps (Tiotropium Bromide Monohydrate) .... Inhale Contents of 1 Capsule Once A Day 5)  Adult Aspirin Low Strength 81 Mg  Tbdp (Aspirin) .... Take One By Mouth Daily 6)  Caltrate 600+d Plus 600-400 Mg-Unit  Tabs (Calcium Carbonate-Vit D-Min) .... One By Mouth Qd 7)  Advair Diskus 250-50 Mcg/dose Misc (Fluticasone-Salmeterol) .... Inhale 1 Puff As Directed Twice A Day 8)  Proventil Hfa 108 (90 Base) Mcg/act  Aers (Albuterol Sulfate) .... Inhale 2 Puffs Every 4 To 6 Hours As Needed 9)  Albuterol Sulfate (2.5 Mg/20ml) 0.083% Nebu (Albuterol Sulfate) .Marland Kitchen.. 1 Vial in Nebulizer Every 6 Hours As Needed 10)  Fish Oil 300 Mg Caps (Omega-3 Fatty Acids) .... Take 1 Tablet By Mouth Once A Day 11)  Amlodipine Besylate 5 Mg Tabs (Amlodipine Besylate) .... Take 1 Tablet By Mouth Once A Day 12)  Vitamin D 1000 Unit Tabs (Cholecalciferol) .... Take 1 Tablet By Mouth Once A Day  Allergies (verified): 1)  * Avapro 2)  * Codiene  Past History:  Past Medical History: Reviewed history from 04/22/2008 and no changes  required. EMPHYSEMA (ICD-492.8) HYPERTENSION, WHITE COAT (ICD-796.2) HYPERTENSION, ESSENTIAL NOS (ICD-401.9) OSTEOPOROSIS (ICD-733.00) HYPERLIPIDEMIA (ICD-272.4) GERD (ICD-530.81) BREAST CANCER, HX OF (ICD-V10.3) ASTHMA (ICD-493.90)    Past Surgical History: Reviewed history from 05/01/2007 and no changes required. Cataract extraction- bilateral (2002) Hysterectomy- partial, fibroids- transfused (1982) Mastectomy- left partial, lymph node dissection (04/2006) Hip fracture- surgery (1994) Colonoscopy- hemorrhoids (2001) Dexa hosp pneumonia 7/08  Review of Systems       The patient complains of shortness of breath with activity, shortness of breath at rest, and productive cough.  The patient denies non-productive cough, coughing up blood, chest pain, irregular heartbeats, acid heartburn, indigestion, loss of appetite, weight change, abdominal pain, difficulty swallowing, sore throat, tooth/dental problems, headaches, nasal congestion/difficulty breathing through nose, sneezing, itching, ear ache, anxiety, depression, hand/feet swelling, joint stiffness or pain, rash, change in color of mucus, and fever.    Vital Signs:  Patient profile:   77 year old female Height:      66 inches (167.64 cm) Weight:      156 pounds (70.91 kg) BMI:     25.27 O2 Sat:      92 % on Room air Temp:     97.7 degrees F (36.50 degrees C) oral Pulse rate:   90 / minute BP sitting:   116 / 66  (right arm) Cuff size:   regular  Vitals Entered By: Michel Bickers CMA (January 16, 2010 9:21  AM)  O2 Sat at Rest %:  92 O2 Flow:  Room air CC: Emphysema.   The patient c/o cough with green mucus and increased sob with exertion and at rest x5 days. She is using her nebulizer more often. Comments Medications reviewed with the patient. Daytime phone number verified. Michel Bickers CMA  January 16, 2010 9:23 AM   Physical Exam  General:  thin female with mild increased wob Nose:  no purulence noted. Lungs:  very  decreased bs with faint expir wheezing. Heart:  rrr, no mrg Extremities:  no edema or cyanosis Neurologic:  alert and oriented, moves all 4.   Impression & Recommendations:  Problem # 1:  CHRONIC OBSTRUCTIVE PULMONARY DISEASE, ACUTE EXACERBATION (ICD-491.21) the pt is having a copd exacerbation, and will require a course of abx for her acute bronchitis and prednisone for her airways disease flare.  She has very little pulmonary reserve, and will need aggressive treatment to prevent hospitalization.  She is to call if not improving.  Medications Added to Medication List This Visit: 1)  Avelox 400 Mg Tabs (Moxifloxacin hcl) .... By mouth daily 2)  Prednisone 10 Mg Tabs (Prednisone) .... Take 4 each day for 2 days, then 3 each day for 2 days, then 2 each day for 2 days, then 1 each day for 2 days, then stop  Other Orders: Est. Patient Level IV ([address]) Prescription Created Electronically [phone])  Patient Instructions: 1)  will treat with avelox 400mg  one each day for 7days. 2)  prednisone taper as directed. 3)  mucinex dm extra strength one in am and pm until better. 4)  keep followup apptm in june.   Prescriptions: PREDNISONE 10 MG  TABS (PREDNISONE) take 4 each day for 2 days, then 3 each day for 2 days, then 2 each day for 2 days, then 1 each day for 2 days, then stop  #20 x 0   Entered and Authorized by:   [name] MD   Signed by:   [name] MD on 01/16/2010   Method used:   [facility]        [facility]  [place] (retail)       [address]       [place], [place]  [address]       Ph: [phone] or [phone]       Fax: [phone]   RxID:   [id number] AVELOX 400 MG  TABS (MOXIFLOXACIN HCL) By mouth daily  #5 x 0   Entered and Authorized by:   [name] MD   Signed by:   [name] MD on 01/16/2010   Method used:   [facility]ron[facility] to        [facility]  [place] (retail)       [address]       [place], [place]   [address]       Ph: [phone] or [phone]       Fax: [phone]   RxID:   [id number]   Appended Document: acute sick visit for copd exacerbation    Clinical Lists Changes [facility] [place]. RX needed to be for 7 tabs instead of 5 tabs on Avelox. Michel Bickers CMA  January 16, 2010 10:05 AM             Medications: Rx of AVELOX 400 MG  TABS (MOXIFLOXACIN HCL) By mouth daily;  #7 x 0;  Signed;  Entered by: Michel Bickers CMA;  Authorized by: [name] MD;  Method used: [facility]phoned [facility]  [place] [place], [place]  [address], Ph: [phone] or [phone], Fax: [phone]    Prescriptions: AVELOX 400 MG  TABS (MOXIFLOXACIN HCL) By mouth daily  #7 x 0   Entered by:   Michel Bickers CMA   Authorized by:   [name] MD   Signed by:   Michel Bickers CMA on 01/16/2010   Method used:   Telephoned to ...       [facility]  [place] (retail)       [address]       [place], [place]  [address]       Ph: [phone] or [phone]       Fax: [phone]   RxID:   [id number]

## 2010-10-23 NOTE — Progress Notes (Signed)
Summary: albuterol neb refill  Phone Note Call from Patient Call back at Home Phone [phone]   Caller: Patient Call For: clance Summary of Call: pt needs a new rx for neb meds (for her portable neb). [facility] [place] [phone] Initial call taken by: Tivis Ringer, CNA,  August 20, 2010 10:33 AM  Follow-up for Phone Call        last ov w/ KC 6.30.11, upcoming 12.29.11.    called spoke with patient who verified that she would like a refill on her albuterol neb solution.  she states that she call [facility] who told her that the last rx they had was from 2009 so a new rx is needed.  i see the rx scanned into EMR.  Mindy, have you seen an updated one for this pt? Boone Master CNA/MA  August 20, 2010 12:41 PM   Additional Follow-up for Phone Call Additional follow up Details #1::        Her name sounds familiar. I do not have any albuterol neb refill come through on my desk. Will forward to alida and see if it was in kc's cmn's. Alida do you have this? Carver Fila  August 20, 2010 1:30 PM       Additional Follow-up for Phone Call Additional follow up Details #2::    I do not have a order or cmn for this pt .Kandice Hams CMA  August 21, 2010 9:02 AM  Rx printed and given to [facility] to have KC sign.  Once rx signed by [facility], it will need to be given to [facility] to send to [facility] in [place].  Will forward message to [facility] so he is aware. Gweneth Dimitri RN  August 21, 2010 11:57 AM  Dr. Shelle Iron sign the rx and will give to [facility] for them to fax. Carver Fila  August 21, 2010 12:40 PM   Rx faxed to [facility] at [phone] and Mayra Reel for [facility] picked up original Rx. Alfonso Ramus  August 21, 2010 12:43 PM   Prescriptions: ALBUTEROL SULFATE (2.5 MG/3ML) 0.083% NEBU (ALBUTEROL SULFATE) 1 vial in nebulizer every 6 hours as needed  #120 x 6   Entered by:   Gweneth Dimitri RN   Authorized by:   [name] MD   Signed by:   Gweneth Dimitri RN on 08/21/2010   Method used:   Printed then faxed  to ...       [facility]  [place] (retail)       [address]       [place], [place]  [address]       Ph: [phone] or [phone]       Fax: [phone]   RxID:   [id number]

## 2010-10-23 NOTE — Assessment & Plan Note (Signed)
Summary: rov for severe emphysema   Primary Provider/Referring Provider:  Eula Listen  CC:  Pt is here for a 6 month f/u appt.  Pt states breathing is unchanged- still sob with exertion.   Pt denied sore throat, headache, and nasal congestion or lower extremity edema.  .  History of Present Illness: the pt comes in today for f/u of her known severe emphysema.  She is staying very active, and has not had an acute exacerbation since her last visit.  She is having doe, but it is near her usual baseline.  She [facility].  She denies any cough, congestion, or purulence.   Current Medications (verified): 1)  Omeprazole 20 Mg  Cpdr (Omeprazole) .... Take One By Mouth Daily 2)  Lipitor 10 Mg  Tabs (Atorvastatin Calcium) .... Take One By Mouth Daily 3)  Altace 10 Mg  Tabs (Ramipril) .... Take One By Mouth Daily 4)  Spiriva Handihaler 18 Mcg  Caps (Tiotropium Bromide Monohydrate) .... Inhale Contents of 1 Capsule Once A Day 5)  Adult Aspirin Low Strength 81 Mg  Tbdp (Aspirin) .... Take One By Mouth Daily 6)  Caltrate 600+d Plus 600-400 Mg-Unit  Tabs (Calcium Carbonate-Vit D-Min) .... One By Mouth Qd 7)  Advair Diskus 250-50 Mcg/dose Misc (Fluticasone-Salmeterol) .... Inhale 1 Puff As Directed Twice A Day 8)  Proventil Hfa 108 (90 Base) Mcg/act  Aers (Albuterol Sulfate) .... Inhale 2 Puffs Every 4 To 6 Hours As Needed 9)  Albuterol Sulfate (2.5 Mg/2ml) 0.083% Nebu (Albuterol Sulfate) .Marland Kitchen.. 1 Vial in Nebulizer Every 6 Hours As Needed 10)  Fish Oil 300 Mg Caps (Omega-3 Fatty Acids) .... Take 1 Tablet By Mouth Once A Day 11)  Amlodipine Besylate 5 Mg Tabs (Amlodipine Besylate) .... Take 1 Tablet By Mouth Once A Day 12)  Vitamin D 1000 Unit Tabs (Cholecalciferol) .... Take 1 Tablet By Mouth Once A Day  Allergies (verified): 1)  * Avapro 2)  * Codiene  Review of Systems       The patient complains of shortness of breath with activity.  The patient  denies shortness of breath at rest, productive cough, non-productive cough, coughing up blood, chest pain, irregular heartbeats, acid heartburn, indigestion, loss of appetite, weight change, abdominal pain, difficulty swallowing, sore throat, tooth/dental problems, headaches, nasal congestion/difficulty breathing through nose, sneezing, itching, ear ache, anxiety, depression, hand/feet swelling, joint stiffness or pain, rash, change in color of mucus, and fever.    Vital Signs:  Patient profile:   77 year old female Height:      66 inches Weight:      150 pounds O2 Sat:      94 % on Room air Temp:     98.0 degrees F oral Pulse rate:   89 / minute BP sitting:   132 / 64  (left arm) Cuff size:   regular  Vitals Entered By: Arman Filter LPN (March 22, 2010 8:52 AM)  O2 Flow:  Room air  Serial Vital Signs/Assessments:  Comments: Ambulatory Pulse Oximetry  Resting; HR__85___    02 Sat__95%RA___  Lap1 (185 feet)   HR_103____   02 Sat_91%RA____ Lap2 (185 feet)   HR__105___   02 Sat__88%RA___    Lap3 (185 feet)   HR__111___   02 Sat__86%RA___  ___Test Completed without Difficulty __X_Test Stopped due to: Pt desat to 86% on the 3rd lap and was then put on 2liters of oxygen and she recovered to 94% in 1 minute  Carver Fila  March 22, 2010 9:53 AM    By: Carver Fila   CC: Pt is here for a 6 month f/u appt.  Pt states breathing is unchanged- still sob with exertion.   Pt denied sore throat,headache, nasal congestion or lower extremity edema.   Comments Medications reviewed with patient Arman Filter LPN  March 22, 2010 8:53 AM    Physical Exam  General:  wd female in nad Lungs:  mildly decreased bs, no wheezing or rhonchi Heart:  rrr, no mrg Extremities:  no edema or cyanosis Neurologic:  alert and oriented, moves all 4.   Impression & Recommendations:  Problem # 1:  EMPHYSEMA (ICD-492.8) the pt has known severe emphysema, but is more functional than her degree of lung disease  would usually dictate.  She feels she is at her usual baseline, and has not had a recent acute exacerbation.  She does desaturate with brisk walking today, and I have recommended that she wear oxygen with heavier exertional activities in order to help her endurance.  She does not want to do this, but will consider.  I have also asked her to think about a pulmonary rehab program.  Other Orders: Est. Patient Level III (60454) Prescription Created Electronically [phone]) Pulse Oximetry, Ambulatory (91478)  Patient Instructions: 1)  no change in pulmonary medications. 2)  continue your exercise program.  It may be worthwhile to [facility] to see what they do, and whether this will be more of a challenge compared to your current regimen?  Let me know if you are interested. 3)  consider whether to use oxygen with exertion 4)  followup with me in 6mos or sooner if having issues.  Prescriptions: ADVAIR DISKUS 250-50 MCG/DOSE MISC (FLUTICASONE-SALMETEROL) Inhale 1 puff as directed twice a day  #3 x 4   Entered and Authorized by:   [name] MD   Signed by:   [name] MD on 03/22/2010   Method used:   Faxed to ...       [facility] ([facility])       [address] [place], [place]  [address]       Ph: [phone]       Fax: [phone]   RxID:   [id number] SPIRIVA HANDIHALER 18 MCG  CAPS (TIOTROPIUM BROMIDE MONOHYDRATE) Inhale contents of 1 capsule once a day  #3 x 4   Entered and Authorized by:   [name] MD   Signed by:   [name] MD on 03/22/2010   Method used:   Faxed to ...       [facility] (mail[facility])       [address] [place], [place]  [address]       Ph: [phone]       Fax: [phone]   RxID:   [id number]

## 2010-10-23 NOTE — Progress Notes (Signed)
Summary: requesting abx  Phone Note Call from Patient Call back at Home Phone [phone]   Caller: Patient Call For: [name] Reason for Call: Talk to Nurse Summary of Call: Patient calling with c/o cough with green mucus and congestion.  Asking for abx.  [facility]  [phone] Initial call taken by: Lehman Prom,  August 03, 2010 8:56 AM  Follow-up for Phone Call        The patient c/o chest congestion and cough with green mucus x2 days. She would like to have abx called to her pharmacy before it gets worse. She denied any sob, fever or sore throat. She did not want to make an appt to see TP today because she is scheduled to follow-up with KC on 12/29. KC please advise.Michel Bickers [facility]  August 03, 2010 9:54 AM  Pt called again inquiring an abx.Darletta Moll  August 03, 2010 2:25 PM   Additional Follow-up for Phone Call Additional follow up Details #1::        ok to call in levaquin 750mg  one each am for 5 days Additional Follow-up by: [name] MD,  August 03, 2010 5:21 PM    Additional Follow-up for Phone Call Additional follow up Details #2::    Spoke with pt and notified rx for levaquin was sent to pharm. Follow-up by: Vernie Murders,  August 03, 2010 5:24 PM  New/Updated Medications: LEVAQUIN 750 MG TABS (LEVOFLOXACIN) 1 by mouth once daily x 5 days Prescriptions: LEVAQUIN 750 MG TABS (LEVOFLOXACIN) 1 by mouth once daily x 5 days  #5 x 0   Entered by:   Vernie Murders   Authorized by:   [name] MD   Signed by:   Vernie Murders on 08/03/2010   Method used:   [facility]ronically to        [facility] [facility]retail)       [address]       [place], [place]  [address]       Ph: [phone] or [phone]       Fax: [phone]   RxID:   [id number]

## 2010-10-25 NOTE — Assessment & Plan Note (Signed)
Summary: rov for emphysema   Primary Provider/Referring Provider:  Eula Listen  CC:  Pt is here for a 6 month f/u appt.  Pt states breathing is unchanged from last visit.   Pt states she will occ cough up scant amount of white sputum. Marland Kitchen  History of Present Illness: the pt comes in today for f/u of her known emphysema.  She is doing well with her meds, and denies any recent acute exacerbation or pulmonary infection.  She is staying active with an [facility] the [facility] center, and feels her breathing is at baseline.  Denies cough or mucus production.  Current Medications (verified): 1)  Omeprazole 20 Mg  Cpdr (Omeprazole) .... Take One By Mouth Daily 2)  Lipitor 10 Mg  Tabs (Atorvastatin Calcium) .... Take One By Mouth Daily 3)  Altace 10 Mg  Tabs (Ramipril) .... Take One By Mouth Daily 4)  Spiriva Handihaler 18 Mcg  Caps (Tiotropium Bromide Monohydrate) .... Inhale Contents of 1 Capsule Once A Day 5)  Adult Aspirin Low Strength 81 Mg  Tbdp (Aspirin) .... Take One By Mouth Daily 6)  Caltrate 600+d Plus 600-400 Mg-Unit  Tabs (Calcium Carbonate-Vit D-Min) .... One By Mouth Qd 7)  Advair Diskus 250-50 Mcg/dose Misc (Fluticasone-Salmeterol) .... Inhale 1 Puff As Directed Twice A Day 8)  Proventil Hfa 108 (90 Base) Mcg/act  Aers (Albuterol Sulfate) .... Inhale 2 Puffs Every 4 To 6 Hours As Needed 9)  Albuterol Sulfate (2.5 Mg/19ml) 0.083% Nebu (Albuterol Sulfate) .Marland Kitchen.. 1 Vial in Nebulizer Every 6 Hours As Needed 10)  Fish Oil 300 Mg Caps (Omega-3 Fatty Acids) .... Take 1 Tablet By Mouth Once A Day 11)  Amlodipine Besylate 5 Mg Tabs (Amlodipine Besylate) .... Take 1 Tablet By Mouth Once A Day 12)  Vitamin D 1000 Unit Tabs (Cholecalciferol) .... Take 1 Tablet By Mouth Once A Day 13)  Cinnamon 500 Mg Caps (Cinnamon) .... Take 2 Tabs By Mouth Daily 14)  Multivitamins  Tabs (Multiple Vitamin) .... Take 1 Tablet By Mouth Once A Day  Allergies (verified): 1)  * Avapro 2)  * Codiene  Review of  Systems       The patient complains of shortness of breath with activity, productive cough, indigestion, itching, anxiety, and rash.  The patient denies shortness of breath at rest, non-productive cough, coughing up blood, chest pain, irregular heartbeats, acid heartburn, loss of appetite, weight change, abdominal pain, difficulty swallowing, sore throat, tooth/dental problems, headaches, nasal congestion/difficulty breathing through nose, sneezing, ear ache, depression, hand/feet swelling, joint stiffness or pain, change in color of mucus, and fever.    Vital Signs:  Patient profile:   77 year old female Height:      66 inches Weight:      148 pounds BMI:     23.97 O2 Sat:      93 % on Room air Temp:     97.6 degrees F oral Pulse rate:   87 / minute BP sitting:   138 / 70  (left arm) Cuff size:   regular  Vitals Entered By: Arman Filter LPN (September 20, 2010 8:53 AM)  O2 Flow:  Room air CC: Pt is here for a 6 month f/u appt.  Pt states breathing is unchanged from last visit.   Pt states she will occ cough up scant amount of white sputum.  Comments Medications reviewed with patient Arman Filter LPN  September 20, 2010 8:54 AM    Physical Exam  General:  wd female  in nad Lungs:  decreased bs, but no wheezing or rhonchi Heart:  rrr, no mrg Extremities:  no edema or cyanosis  Neurologic:  alert and oriented ,moves all 4.   Impression & Recommendations:  Problem # 1:  EMPHYSEMA (ICD-492.8) the pt is doing well from a pulmonary standpoint.  She feels her breathing is at baseline, and is participating in exercise classes at the senior center.   I have asked her to continue on her bronchodilator regimen, and to followup with me in 6mos.  Medications Added to Medication List This Visit: 1)  Cinnamon 500 Mg Caps (Cinnamon) .... Take 2 tabs by mouth daily 2)  Multivitamins Tabs (Multiple vitamin) .... Take 1 tablet by mouth once a day  Patient Instructions: 1)  continue on  current pulmonary meds 2)  stay as active as possible 3)  followup with me in 6mos.    Appended Document: Orders Update    Clinical Lists Changes  Orders: Added new Service order of Est. Patient Level III (04540) - Signed

## 2010-10-25 NOTE — Letter (Signed)
Summary: Statement of Medical Necessity / [facility]  Statement of Medical Necessity / [facility]   Imported By: Lennie Odor 10/04/2010 17:20:18  _____________________________________________________________________  External Attachment:    Type:   Image     Comment:   External Document

## 2010-11-07 ENCOUNTER — Encounter: Payer: Self-pay | Admitting: Pulmonary Disease

## 2010-11-20 NOTE — Letter (Signed)
Summary: CMN for Nebulizer & Meds/[facility]  CMN for Nebulizer & Meds/[facility]   Imported By: Sherian Rein 11/14/2010 11:08:41  _____________________________________________________________________  External Attachment:    Type:   Image     Comment:   External Document

## 2011-01-21 ENCOUNTER — Other Ambulatory Visit: Payer: Self-pay | Admitting: Internal Medicine

## 2011-01-21 DIAGNOSIS — N644 Mastodynia: Secondary | ICD-10-CM

## 2011-01-21 DIAGNOSIS — Z9889 Other specified postprocedural states: Secondary | ICD-10-CM

## 2011-01-24 ENCOUNTER — Ambulatory Visit
Admission: RE | Admit: 2011-01-24 | Discharge: 2011-01-24 | Disposition: A | Discharge status: Home / Self Care | Payer: Medicare Other | Source: Ambulatory Visit | Attending: Internal Medicine | Admitting: Internal Medicine

## 2011-01-24 DIAGNOSIS — N644 Mastodynia: Secondary | ICD-10-CM

## 2011-01-24 DIAGNOSIS — Z9889 Other specified postprocedural states: Secondary | ICD-10-CM

## 2011-02-05 NOTE — H&P (Signed)
[name]                ACCOUNT NO.:  [id number]   MEDICAL RECORD NO.:  [id number]          PATIENT TYPE:  EMS   LOCATION:  MAJO                         FACILITY:  [facility]   PHYSICIAN:  Raenette Rover. Felicity Coyer, MDDATE OF BIRTH:  [birth year]   DATE OF ADMISSION:  04/15/2007  DATE OF DISCHARGE:                              HISTORY & PHYSICAL   CHIEF COMPLAINT:  Chest pain (pneumonia).   HISTORY OF PRESENT ILLNESS:  The patient is a 77 year old woman who  presents to the [facility] Emergency Room via [facility]  where she presented today for evaluation of progressive pain after an  accidental fall backwards at church 3 weeks ago.  Initially, she  experienced just left buttock and hip pain, which has been treated at  home with Advil, but today with left-sided chest pain, rib pain  especially with deep inspiration.  She has had chills, headache and  anorexia.  Denies reflux or other recurrent injury since her fall.  No  syncope, presyncope or dizzy symptoms.  No lower extremity swelling.  No  travel.  No cough or sputum.  No previous MI or coronary disease  history.  Regarding cardiac risk factors, she has not smoked in over 12  years.  Denies diabetes, but does have high blood pressure and  cholesterol and a family history in advanced age of family members.   PAST MEDICAL HISTORY:  Significant for:  1. Breast cancer, for which she is status post left partial mastectomy      in August of 2007 and status post treatment with radiation therapy,      October 1 through November 21 of last year.  She has not had      chemotherapy, but reportedly says her last evaluation with Dr.      Myna Hidalgo, oncologist, is negative.  2. Severe emphysema/chronic obstructive pulmonary disease, not oxygen      dependent and intolerant to steroids, followed by Dr. Shelle Iron.  3. Hypertension.  4. Dyslipidemia.   MEDICATIONS AT HOME:  Include:  1. Advair 250/50 b.i.d.  2. Altace 10 mg daily.  3.  Aspirin 81 mg daily.  4. Calcium supplements 600 mg daily.  5. Combivent inhaler p.r.n.  6. Lipitor 10 mg q.h.s.  7. Prilosec 20 mg daily.  8. Spiriva inhalation daily.   ALLERGIES:  INCLUDE:  1. PREDNISONE WHICH CAUSES HER HEART TO HURT LIKE A HEART ATTACK.  2. AVAPRO CAUSES HIVES.  3. CODEINE CAUSES NAUSEA WHEN GIVEN IN LARGE QUANTITIES.   FAMILY HISTORY:  Her mother died of a heart attack at age 84 and her  father died at age 14 due to complications from diabetes and congestive  heart failure.   SOCIAL HISTORY:  She lives alone, but has multiple supportive family  members nearby.  She has not smoked in 12 years and does not drink  alcohol.   REVIEW OF SYSTEMS:  As listed in HPI above.  She has had spasms in her  left buttock, back and hip as listed above and ambulates normally  despite pain, good relief with Advil.  No chest injury or headaches due  to fall.  No cough with sputum, but positive cold sweats today.  Please  see HPI above; otherwise systems reviewed is negative.   PHYSICAL EXAMINATION:  VITAL SIGNS:  Temperature 99.3, now 101.2.  Blood  pressure 144/55.  Pulse of 88-110.  Respirations 20.  O2 saturation 96%  on room air.  IN GENERAL:  She is a pleasant appropriate woman with her sister at  bedside, nebulizer ongoing, who stops and takes breaths easily.  She is  in no acute distress.  HEENT:  Head is normocephalic, atraumatic.  PERRL.  EOMI.  Oropharynx is  clear.  NECK:  Supple without JVD or appreciable goiter.  LUNGS:  Showed expiratory mid left chest wheeze with inspiration.  No  rhonchi, but few mid left lung fine crackles on the posterior side.  CARDIOVASCULAR:  Distant, but regular.  ABDOMEN:  Soft, nontender with good bowel sounds.  No rebound, guarding  or distention.  EXTREMITIES:  Bilaterally warm with fever to touch, but without  tenseness, without swelling and no edema.  NEUROLOGICALLY:  Awake, alert and oriented x4 and intact grossly.    LABORATORY DATA:  Significant for a D-dimer of 1.17, white count of  16.5, normal hemoglobin 12.7, normal platelets of 202.  I-STAT 8 with a  setting of 131, glucose of 149; otherwise, unremarkable.  Chest x-ray  shows a left mid lung infiltrate, consistent with pneumonia, no  effusion.  EKG showed sinus tachycardia with PVCs, left axis deviation  and T wave inversion of the LV 5 and 6.   ASSESSMENT/PLAN:  1. Chest pain, left side pleuritic.  I suspect this is secondary to      pneumonia based on her chest x-ray changes, leukocytosis and fever      and agree with antibiotics as ordered.  We will continue Rocephin      and azithromycin in this regard and followup blood cultures as      taken in the emergency room.  She is also at increased risk for a      pulmonary embolism given oncologic history and with an elevated D-      dimer and will proceed CT chest to rule out pulmonary embolism, as      has been ordered in the emergency room, but not yet done.  Patient      also has advanced chronic obstructive pulmonary disease at baseline      and will continue her nebulizers and oxygen as needed.  This does      not appear to be with an acute exacerbation at this time.  We will      avoid steroids, if at all possible, this patient is intolerant of      prednisone causing further chest pain.  Lastly, because of her EKG      changes and some cardiac risk factors, we will monitor in telemetry      with serial cardiac enzymes, continue her aspirin daily.  We will      pursue cardiac evaluation with her cardiologist Dr. Alanda Amass if      there are abnormalities.  We will also treat with full dose Lovenox      until a pulmonary embolism and acute coronary syndrome has been      ruled out.  2. Advanced chronic obstructive pulmonary disease.  Please see problem      above as far as orders.  No acute exacerbation.  3. Hypertension.  Continue  home medications.  4. Dyslipidemia.  Continue home  Lipitor.  5. Gastroesophageal reflux disease history.  Continue home proton pump      inhibitor.  6. Left hip and buttock pain, status post fall.  Her x-rays, done at      urgent care, are negative for fracture.  Treatment with p.r.n.      muscle relaxants for spasms.  Place an order for physical therapy      and occupational      therapy if she wants after problem number 1 improved.  7. History of left breast cancer status post surgery and radiation      this past summer and fall, reportedly stable.  Follow up with Dr.      Myna Hidalgo p.r.n.      Valerie A. Felicity Coyer, MD  Electronically Signed     VAL/MEDQ  D:  04/15/2007  T:  04/16/2007  Job:  [id number]   cc:   Marne A. Milinda Antis, MD

## 2011-02-05 NOTE — Discharge Summary (Signed)
NAMEKEOSHA, ROSSA                ACCOUNT NO.:  [id number]   MEDICAL RECORD NO.:  [id number]          PATIENT TYPE:  INP   LOCATION:  6713                         FACILITY:  [facility]   PHYSICIAN:  Sandford Craze, NP DATE OF BIRTH:  [birth year]   DATE OF ADMISSION:  04/15/2007  DATE OF DISCHARGE:  04/24/2007                               DISCHARGE SUMMARY   DISCHARGE DIAGNOSES:  1. Left lower lobe pneumonia in the setting of severe chronic      obstructive pulmonary disease and hypoxia.  2. Positive blood culture for coagulase-negative Staphylococcus ,      likely skin contaminant.  3. Hypertension.  4. Dyslipidemia.  5. Gastroesophageal reflux disease.  6. History of left breast cancer.  7. Hypokalemia, resolved, status post repletion.  8. Anemia.   HISTORY OF PRESENT ILLNESS:  Ms. Demeo is a 77 year old female who was  admitted on April 15, 2007, with chief complaint of chest pain.  She  initially presented to the [facility] for evaluation of  progressive pain after an accidental fall backwards at church three  weeks ago.  Chest x-ray performed noted a left mid lung infiltrate.  She  was admitted for further treatment of pneumonia.   PAST MEDICAL HISTORY:  1. Breast cancer, status post left partial mastectomy in August 2007.  2. Severe emphysema/chronic obstructive pulmonary disease, followed by      Dr. Marcelyn Bruins.  3. Hypertension.  4. Dyslipidemia.   HOSPITAL COURSE:  1. Left lower lobe pneumonia in the setting of severe COPD:  The      patient was admitted.  She was noted to have an elevated D-dimer      which prompted CT angio of the chest, which was negative for      pulmonary embolus.  She was treated with Rocephin and azithromycin,      and she continued to spike fevers as high as 102.  A pulmonary      consult was requested, and the patient was placed also on IV      vancomycin.  She was initially seen by Dr.  __________ .  She continued with slow  improvement, and was thus started  on IV Solu-Medrol.  She was noted to improve with the addition of  steroids.  At this time, she has been transitioned over to oral Avelox,  as well as oral Medrol, and is currently stable.  She was noted to be  significantly hypoxic on room air with ambulation, with O2 sats as low  as 84% on room air during this admission.  As a result, we will arrange  home oxygen 2 liters nasal cannula to be worn continuously and close  followup appointment with Dr. Marcelyn Bruins.  In addition, we have  requested a nebulizer machine for the patient so that she may continue  her albuterol nebulizer __________.  1. Hypertension, uncontrolled:  The patient was noted to have an      elevated blood pressure during this admission.  Her Altace was      increased to b.i.d. dosing.  Her blood  pressure is still under      suboptimal control.  This will need continued outpatient followup.   DISCHARGE MEDICATIONS:  1. Aspirin 81 mg p.o. daily.  2. Lipitor 10 mg p.o. daily.  3. Spiriva 18 mcg inhalation once daily.  4. Nasonex 600 mg p.o. b.i.d.  5. Albuterol nebs 2.5 mg/3 mL every 6 hours while awake.  6. Tussionex 5 mL twice daily as needed for cough.  7. Altace 10 mg p.o. b.i.d.  8. Avelox 400 mg p.o. daily for 7 additional days.  9. Medrol 32 mg, which is equal to 8 tablets, on August 2.  Then 28      mg, which is equal to 7 tabs, on August 3 and August 4.  Then 24      mg, which is equal to 6 tablets, on August 5 and August 6, and then      20 mg, which is equal to 5 tablets, on August 7 and August 8.  Then      15 mg, which is equal to 4 tablets, on August 9 and August 10.      Then 12 mg, which is equal to 3 tabs, on August 11.  Then 8 mg,      which is equal to 2 tablets, on August 12.  Then 4 mg, which is      equal to 1 tablet, on August 13, and then stop.  10.Vitamin C 500 mg p.o. daily.  11.Omeprazole 20 mg p.o. daily.  12.Calcium plus D 600 mg p.o. daily.    PERTINENT LABORATORIES AT TIME OF DISCHARGE:  BUN 5, creatinine 0.44,  hemoglobin 12.7.   DISPOSITION:  The patient will be discharged to home.  Will continue  home oxygen 2 liters nasal cannula.   FOLLOWUP:  The patient is to follow up with Dr. Marcelyn Bruins on August  12 at 10:45 a.m., and Dr. Roxy Manns August 8 at 3:15 p.m.  She was  instructed to call Dr. Shelle Iron should she developed fever more than once,  worsening shortness of breath or cough, or go to the emergency room.      Sandford Craze, NP     MO/MEDQ  D:  04/24/2007  T:  04/25/2007  Job:  [id number]   cc:   [name], MD,FCCP  Audrie Gallus Milinda Antis, MD

## 2011-02-05 NOTE — Consult Note (Signed)
NAMELESLYN, MONDA                ACCOUNT NO.:  [id number]   MEDICAL RECORD NO.:  [id number]          PATIENT TYPE:  INP   LOCATION:  6713                         FACILITY:  [facility]   PHYSICIAN:  Coralyn Helling, MD        DATE OF BIRTH:  1929-11-15   DATE OF CONSULTATION:  04/17/2007  DATE OF DISCHARGE:                                 CONSULTATION   PRIMARY PULMONOLOGIST:  Dr. Marcelyn Bruins   REASON FOR CONSULTATION:  Pneumonia and COPD.   HISTORY:  [name] is a 77 year old female who was admitted on July 23  with left-sided pleuritic chest pain.  [name] says that the day prior to  admission [name] had at the problems feeling fatigued in association with  pain.  [name] of  this; and it was during this evaluation that [name] was noted to have a new  infiltrate of the left lower lobe; and as a result [name] was subsequently  admitted for further treatment of her pneumonia.  [name] was not having  much as far as coughing or sputum production at home.  [name] denied  noticing any fevers at home, although [name] did have a temperature spike  to 102 degrees Fahrenheit last night.  [name] has not had any hemoptysis.  [name] denies any abdominal pain, nausea, vomiting, or diarrhea.  [name] has  not had any leg pain or leg swelling.  [name] says that [name] has been  feeling somewhat better since being admitted to the hospital.  [name] does  still have feelings of fatigue as well as dyspnea on exertion.  [name] was  initially started on Rocephin and Zithromax for her pneumonia but due to  her temperature spike [name] was started on vancomycin earlier this  morning.  [name] had blood cultures drawn on July 23 which are negative to  date; and [name] had repeat blood cultures done on July 25 which are  pending.  [name] says that [name] has had her pneumonia vaccination before;  and [name] gets the flu shot every year.   PAST MEDICAL HISTORY SIGNIFICANT FOR:  1. Breast cancer; and [name] is status post left  partial mastectomy in      August 2007.  [name] has also had radiation therapy from October to      November.  [name] is being seen by Dr. Myna Hidalgo for this.  2. Severe COPD with emphysema.  3. Hypertension.  4. Dyslipidemia.   MEDICATIONS AT HOME:  1. Advair 250-51 puffs b.i.d.  2. Altace 10 mg daily.  3. Aspirin 81 mg daily.  4. Calcium 600 mg daily.  5. Combivent inhaler as needed.  6. Lipitor 10 mg q.h.s.  7. Prilosec 20 mg daily.  8. Spiriva 1 puff daily.   ALLERGIES INCLUDE:  PREDNISONE which was due to have chest pain, AVAPRO  which causes to have hives, and COLDRINE which causes her to have  nausea.   FAMILY HISTORY:  Significant for her mother who died of heart attack at  the age of 28; and her father who died  at the age of 32 related to  congestive heart failure and diabetes.   SOCIAL HISTORY:  [name] lives alone.  [name] quit smoking 4 years ago.  There  is no history of significant alcohol use.   REVIEW OF SYSTEMS:  Unremarkable except as stated above.   PHYSICAL EXAM:  GENERAL:  [name] is seen in her hospital room.  [name] is  awake, alert and oriented, and does not appear been acute distress.  VITAL SIGNS:  T-max is 102.3, temperature currently is 98.1. Blood  pressure is 111/70.  Heart rate is 85.  Respiratory rate is 20. Oxygen  saturation is 93% on 2 liters.  HEENT:  Pupils reactive.  Extraocular muscles are intact.  There is no  sinus tenderness, no discharge, no lesions.  NECK:  No lymphadenopathy.  HEART:  S1-S2.  CHEST:  There is good air entry on the right.  There are fine crackles  and wheezes heard in the left mid lung field posteriorly.  ABDOMEN:  Soft, nontender, positive bowel sounds.  EXTREMITIES:  There is no edema sinus clubbing.  NEUROLOGIC EXAM:  No focal deficits were appreciated.   WBC 12.4, hemoglobin 10.7, hematocrit 31.1, and platelet count is 157.  Sodium 134, potassium 3.9, chloride 22, CO2 24, BUN 17, creatinine 0.8,  glucose is 120.  Cardiac  enzymes are negative.  Calcium is 8.6.  Chest x-  ray showed the left lower lobe infiltrate.  CT scan of her chest was  negative for pulmonary embolism.  It showed a large hiatal hernia and  the left lower lobe infiltrate and a 1.2 cm left hilar node   IMPRESSION:  1. Pneumonia.  I will continue her on her current antibiotic regimen      of Rocephin, Zithromax, and vancomycin.  I would follow up on her      blood cultures.  If her cultures are negative, and [name] does not      have any recurrence of temperature spike, I would discontinue her      vancomycin; and [name] would likely need to have a total of 7-10 days      of antibiotics.  2. Chronic obstructive pulmonary disease with emphysema.  I would      continue her on her current inhaler regimen.  3. Hypoxemia which, I believe, is related to her pneumonia on top of      her chronic obstructive pulmonary disease with emphysema.  Once [name]      is clinically stable for discharge this will need to be reassessed      to determine if [name] would need to have home oxygen.  4. Anemia.  [name] is hemodynamically stable at the present time, but I      would follow up on her hemoglobin to determine if any further      interventions will be necessary for this.  5. History of breast cancer.  [name] is have outpatient follow up with      Dr. Myna Hidalgo for this.  6. Hypertension and dyslipidemia.  This is to be managed further by      her primary care team.      Coralyn Helling, MD  Electronically Signed     VS/MEDQ  D:  04/17/2007  T:  04/18/2007  Job:  [id number]

## 2011-02-08 NOTE — Assessment & Plan Note (Signed)
[facility]                             PULMONARY OFFICE NOTE   NAME:[name], [name]                       MRN:          [id number]  DATE:08/01/2006                            DOB:          1930/07/28    HISTORY OF PRESENT ILLNESS:  The patient is a 77 year old white female  patient of Dr. Shelle Iron, who has a known history of severe emphysematous  COPD.  She presents for a five-day history of nasal congestion,  productive cough, thick green sputum and cough.  The patient denies any  hemoptysis, orthopnea, PND or leg-swelling.  The patient is maintained  on Advair 250/50 twice daily and Spiriva daily.   PAST MEDICAL HISTORY:  Reviewed.   MEDICATIONS:  Reviewed.   PHYSICAL EXAMINATION:  GENERAL:  The patient is a pleasant female, in no  acute distress.  VITAL SIGNS:  She is afebrile with stable vital signs.  HEENT:  Unremarkable.  NECK:  Supple, without adenopathy.  LUNGS:  Lung sounds reveal coarse breath sounds bilaterally, without any  wheezing.  CARDIAC:  Regular rate and rhythm.  ABDOMEN:  Soft, benign.  EXTREMITIES:  Warm without any edema.   IMPRESSION AND PLAN:  Acute exacerbation of COPD.  The patient is to  begin Augmentin time seven days.  Mucinex DM twice a day.  Endal-HD  8  ounces one-half to one teaspoon every 4-6 hours as needed for cough.  The patient is to return with Dr. Shelle Iron as scheduled, or sooner, if  needed.      Rubye Oaks, NP  Electronically Signed      [name], MD,FCCP  Electronically Signed   TP/MedQ  DD: 08/01/2006  DT: 08/01/2006  Job #: [id number]

## 2011-02-08 NOTE — Op Note (Signed)
[name], [name]                ACCOUNT NO.:  [id number]   MEDICAL RECORD NO.:  [id number]          PATIENT TYPE:  AMB   LOCATION:  SDS                          FACILITY:  [facility]   PHYSICIAN:  Gita Kudo, M.D. DATE OF BIRTH:  [birth year]   DATE OF PROCEDURE:  05/14/2006  DATE OF DISCHARGE:                                 OPERATIVE REPORT   OPERATIVE PROCEDURE:  1. Left partial mastectomy.  2. Attempted left axillary sentinel node biopsy.  3. Left axillary lymph node dissection.   SURGEON:  Gita Kudo, M.D.   ANESTHESIA:  General endotracheal.   PREOPERATIVE DIAGNOSIS:  Cancer, left breast.   POSTOPERATIVE DIAGNOSIS:  Cancer, left breast.   CLINICAL SUMMARY:  A 77 year old female with abnormal mammogram, had  ultrasound core biopsy showing cancer.  After discussion of options she  comes, in preferring a partial mastectomy and lymph node biopsy versus lymph  node dissection.   OPERATIVE FINDINGS:  The lesion was well-marked by x-ray and needle-  localized, and I went widely around it.  Did not encounter it nor feel it.  The radiologist reported that the specimen contained the abnormality, and  pathology showed the margins were okay.  The axilla was dissected because I  could not find a hot or blue node initially and at the termination of the  axillary dissection, we finally encountered the hot blue node.   OPERATIVE PROCEDURE:  Under satisfactory general endotracheal anesthesia,  the patient had methylene blue injected subareolarly and then was prepped,  draped and positioned in the standard fashion.  The partial mastectomy was  performed first.  A crescent-shaped incision was made encompassing the wire  and going widely around it.  Then cautery was used to make small flaps and  incise the breast tissue down to the chest wall.  The tissue was then  elevated and removed.  The specimen was marked with a suture.  There were no  complications and I did not encounter  the wire and since the wire went  through the lesion, knew that we had good margins clinically.  It was then  sent for radiology and pathology.   Meanwhile, the axillary portion was performed.  The NeoProbe was used and I  was not able to find any really hot areas.  I made a transverse axillary  incision and carried this down to the pectoralis muscle, which was retracted  medially and the dissection continued down.  I carefully looked for blue  lymphatics or nodes in could not find any, nor could I get any significant  increase on the NeoProbe.  Because of that, I did an axillary dissection.  The pectoralis minor was retracted medially and beginning at the apex, I  removed the axillary tissue inferiorly.  One or two axillary vein branches  were taken between clips out to the thoracodorsal vessels.  These vessels  and nerve were identified and not injured.  The long thoracic nerve likewise  identified and not injured.  Then the axillary tissue in the area between  the nerves was swept down and removed.  Bleeders were either cauterized or  treated with clips.  As the dissection finalized, I identified the hot blue  node both by NeoProbe and visualization.  This was confirmed and I put a  suture on it to mark it and then sent the entire axillary contents down.   Report came back from pathology that my margins were clear.  Therefore, both  incisions were closed.  A 19 Blake drain led from the axilla out an inferior  wound.  Axilla reapproximated with 4-0 PDS and staples for skin.  The breast  incision was checked for hemostasis and closed in layers with 2-0 Vicryl and  staples.  Sterile absorbent dressings were then applied and the patient went  to the recovery room from the operating room good condition.  There were no  complications.           ______________________________  Gita Kudo, M.D.     MRL/MEDQ  D:  05/14/2006  T:  05/15/2006  Job:  [id number]   cc:   Marne A. Milinda Antis,  MD  [name], MD,FCCP  New Patient Coordinator

## 2011-02-08 NOTE — Assessment & Plan Note (Signed)
[facility]                             PULMONARY OFFICE NOTE   NAME:[name], [name]                       MRN:          [id number]  DATE:10/23/2006                            DOB:          [birth year]    The patient is a 77 year old white female patient of Dr. Shelle Iron who has  a known history of severe emphysematous COPD who presents for an acute  office visit.  The patient complains of a 1-day history of nasal  congestion, chills, body aches, and sore throat.  The patient denies any  purulent sputum, chest pain, orthopnea, PND, or leg swelling.  The  patient is maintained on Advair 250/50 twice daily, Spiriva daily.   PAST MEDICAL HISTORY:  Reviewed.   CURRENT MEDICATIONS:  Reviewed.   PHYSICAL EXAM:  The patient is a pleasant female in no acute distress.  She is afebrile with stable vital signs.  O2 saturation is 95% on room  air.  HEENT:  Nasal mucosa with some mild erythema.  Nontender sinuses.  Posterior pharynx is clear.  NECK:  Supple without adenopathy.  LUNGS:  Sounds are diminished at the bases.  Otherwise, clear.  CARDIAC:  Regular rate and rhythm.  ABDOMEN:  Soft and benign.  EXTREMITIES:  Warm without any edema.   IMPRESSION AND PLAN:  Acute upper respiratory infection.  The patient is  to begin Mucinex DM twice daily.  May use Tylenol as needed for fever  and body aches.  The patient was given a prescription for Omnicef x5  days to have on hold in case symptoms worsen with discolored sputum over  the next 5 to 7 days.  The patient was given a Xopenex nebulizer  treatment in the office.  The patient will follow back up with Dr.  Shelle Iron in 1 month or sooner if needed.      Rubye Oaks, NP  Electronically Signed      [name], MD,FCCP  Electronically Signed   TP/MedQ  DD: 10/23/2006  DT: 10/23/2006  Job #: [id number]

## 2011-03-20 ENCOUNTER — Encounter: Payer: Self-pay | Admitting: Pulmonary Disease

## 2011-03-21 ENCOUNTER — Ambulatory Visit (INDEPENDENT_AMBULATORY_CARE_PROVIDER_SITE_OTHER): Payer: Medicare Other | Admitting: Pulmonary Disease

## 2011-03-21 ENCOUNTER — Encounter: Payer: Self-pay | Admitting: Pulmonary Disease

## 2011-03-21 VITALS — BP 124/60 | HR 80 | Temp 97.8°F | Ht 65.0 in | Wt 148.4 lb

## 2011-03-21 DIAGNOSIS — J449 Chronic obstructive pulmonary disease, unspecified: Secondary | ICD-10-CM

## 2011-03-21 MED ORDER — TIOTROPIUM BROMIDE MONOHYDRATE 18 MCG IN CAPS: 18.0000 ug | ORAL_CAPSULE | Freq: Every day | RESPIRATORY_TRACT | Status: DC

## 2011-03-21 MED ORDER — FLUTICASONE-SALMETEROL 250-50 MCG/DOSE IN AEPB: 1.0000 | INHALATION_SPRAY | Freq: Two times a day (BID) | RESPIRATORY_TRACT | Status: DC

## 2011-03-21 NOTE — Progress Notes (Signed)
  Subjective:    Patient ID: [name], female    DOB: [birth year], 77 y.o.   MRN: [id number]  HPI The pt comes in today for f/u of her known emphysema.  She is doing very well, and is continuing on her exercise program.  She has been compliant with her meds, and has not had a recent pulmonary infection or acute exacerbation.  She denies cough, congestion, or sputum production.    Review of Systems  Constitutional: Negative for fever and unexpected weight change.  HENT: Negative for ear pain, nosebleeds, congestion, sore throat, rhinorrhea, sneezing, trouble swallowing, dental problem, postnasal drip and sinus pressure.   Eyes: Negative for redness and itching.  Respiratory: Negative for cough, chest tightness, shortness of breath and wheezing.   Cardiovascular: Negative for palpitations and leg swelling.  Gastrointestinal: Negative for nausea and vomiting.  Genitourinary: Negative for dysuria.  Musculoskeletal: Negative for joint swelling.  Skin: Negative for rash.  Neurological: Negative for headaches.  Hematological: Bruises/bleeds easily.  Psychiatric/Behavioral: Negative for dysphoric mood. The patient is not nervous/anxious.        Objective:   Physical Exam Wd female in nad Chest with decreased bs, no wheezing or rhonchi Cor with distant sounds, rrr LE without edema, no cyanosis Alert, oriented, moves all 4        Assessment & Plan:

## 2011-03-21 NOTE — Patient Instructions (Signed)
No change in meds.  Continue on your exercise program followup with me in 6mos

## 2011-03-24 NOTE — Assessment & Plan Note (Signed)
The pt is doing as well as can be expected given the severity of her lung disease.  She is staying active, and has not had a recent acute exacerbation.  I have asked her to stay on her current inhaler regimen, and will followup with me in 6mos.

## 2011-05-02 ENCOUNTER — Other Ambulatory Visit: Payer: Self-pay | Admitting: Hematology & Oncology

## 2011-05-02 ENCOUNTER — Encounter (HOSPITAL_BASED_OUTPATIENT_CLINIC_OR_DEPARTMENT_OTHER): Payer: Medicare Other | Admitting: Hematology & Oncology

## 2011-05-02 DIAGNOSIS — E559 Vitamin D deficiency, unspecified: Secondary | ICD-10-CM

## 2011-05-02 DIAGNOSIS — C50519 Malignant neoplasm of lower-outer quadrant of unspecified female breast: Secondary | ICD-10-CM

## 2011-05-02 DIAGNOSIS — Z171 Estrogen receptor negative status [ER-]: Secondary | ICD-10-CM

## 2011-05-02 LAB — COMPREHENSIVE METABOLIC PANEL
ALT: 20 U/L (ref 0–35)
AST: 22 U/L (ref 0–37)
Albumin: 4.3 g/dL (ref 3.5–5.2)
BUN: 14 mg/dL (ref 6–23)
CO2: 31 mEq/L (ref 19–32)
Calcium: 9.8 mg/dL (ref 8.4–10.5)
Creatinine, Ser: 0.78 mg/dL (ref 0.50–1.10)
Glucose, Bld: 94 mg/dL (ref 70–99)
Total Bilirubin: 0.4 mg/dL (ref 0.3–1.2)
Total Protein: 6.7 g/dL (ref 6.0–8.3)

## 2011-05-02 LAB — CBC WITH DIFFERENTIAL (CANCER CENTER ONLY)
BASO#: 0 10*3/uL (ref 0.0–0.2)
Eosinophils Absolute: 0.2 10*3/uL (ref 0.0–0.5)
HCT: 39.1 % (ref 34.8–46.6)
HGB: 13.5 g/dL (ref 11.6–15.9)
LYMPH%: 23.7 % (ref 14.0–48.0)
MCH: 31.3 pg (ref 26.0–34.0)
MCV: 91 fL (ref 81–101)
MONO#: 0.5 10*3/uL (ref 0.1–0.9)
MONO%: 7 % (ref 0.0–13.0)
NEUT%: 66.8 % (ref 39.6–80.0)
Platelets: 186 10*3/uL (ref 145–400)
RBC: 4.32 10*6/uL (ref 3.70–5.32)
WBC: 6.7 10*3/uL (ref 3.9–10.0)

## 2011-05-13 ENCOUNTER — Other Ambulatory Visit: Payer: Self-pay | Admitting: *Deleted

## 2011-05-13 DIAGNOSIS — J449 Chronic obstructive pulmonary disease, unspecified: Secondary | ICD-10-CM

## 2011-05-13 MED ORDER — FLUTICASONE-SALMETEROL 250-50 MCG/DOSE IN AEPB: 1.0000 | INHALATION_SPRAY | Freq: Two times a day (BID) | RESPIRATORY_TRACT | Status: DC

## 2011-05-14 ENCOUNTER — Other Ambulatory Visit: Payer: Self-pay | Admitting: Internal Medicine

## 2011-05-14 DIAGNOSIS — Z853 Personal history of malignant neoplasm of breast: Secondary | ICD-10-CM

## 2011-06-20 ENCOUNTER — Ambulatory Visit
Admission: RE | Admit: 2011-06-20 | Discharge: 2011-06-20 | Disposition: A | Discharge status: Home / Self Care | Payer: Medicare Other | Source: Ambulatory Visit | Attending: Internal Medicine | Admitting: Internal Medicine

## 2011-06-20 DIAGNOSIS — Z853 Personal history of malignant neoplasm of breast: Secondary | ICD-10-CM

## 2011-07-01 ENCOUNTER — Ambulatory Visit: Payer: Medicare Other

## 2011-07-08 LAB — DIFFERENTIAL
Basophils Absolute: 0
Basophils Relative: 0
Eosinophils Absolute: 0
Eosinophils Relative: 0
Lymphocytes Relative: 5 — ABNORMAL LOW

## 2011-07-08 LAB — CARDIAC PANEL(CRET KIN+CKTOT+MB+TROPI)
CK, MB: 2.4
Relative Index: 1.2
Relative Index: 1.5
Total CK: 151
Total CK: 194 — ABNORMAL HIGH
Troponin I: 0.01
Troponin I: 0.03

## 2011-07-08 LAB — CBC
HCT: 37.6
Hemoglobin: 10.7 — ABNORMAL LOW
MCHC: 33.7
MCV: 89.3
MCV: 90.2
Platelets: 173
Platelets: 202
RBC: 3.44 — ABNORMAL LOW
RBC: 3.53 — ABNORMAL LOW
RDW: 13.2
WBC: 10.1

## 2011-07-08 LAB — CULTURE, BLOOD (ROUTINE X 2)
Culture: NO GROWTH
Culture: NO GROWTH

## 2011-07-08 LAB — BASIC METABOLIC PANEL
BUN: 4 — ABNORMAL LOW
BUN: 5 — ABNORMAL LOW
Calcium: 8.6
Chloride: 101
Chloride: 102
Creatinine, Ser: 0.5
Creatinine, Ser: 0.83
GFR calc Af Amer: >60
GFR calc Af Amer: >60
GFR calc non Af Amer: >60
GFR calc non Af Amer: >60
GFR calc non Af Amer: >60
Potassium: 3.3 — ABNORMAL LOW
Potassium: 3.5
Sodium: 134 — ABNORMAL LOW
Sodium: 139

## 2011-07-08 LAB — I-STAT 8, (EC8 V) (CONVERTED LAB)
Bicarbonate: 24.3 — ABNORMAL HIGH
HCT: 41
Potassium: 4
TCO2: 25
pH, Ven: 7.412 — ABNORMAL HIGH

## 2011-07-08 LAB — CK TOTAL AND CKMB (NOT AT ARMC): CK, MB: 3.2

## 2011-07-08 LAB — D-DIMER, QUANTITATIVE: D-Dimer, Quant: 1.17 — ABNORMAL HIGH

## 2011-07-08 LAB — VANCOMYCIN, TROUGH: Vancomycin Tr: 7.1

## 2011-07-08 LAB — TSH: TSH: 0.662

## 2011-07-08 LAB — POCT I-STAT CREATININE: Operator id: 247071

## 2011-07-15 ENCOUNTER — Ambulatory Visit (INDEPENDENT_AMBULATORY_CARE_PROVIDER_SITE_OTHER): Payer: Medicare Other

## 2011-07-15 DIAGNOSIS — Z23 Encounter for immunization: Secondary | ICD-10-CM

## 2011-09-19 ENCOUNTER — Ambulatory Visit: Payer: 59 | Admitting: Pulmonary Disease

## 2011-09-23 ENCOUNTER — Encounter: Payer: Self-pay | Admitting: Pulmonary Disease

## 2011-09-23 ENCOUNTER — Ambulatory Visit (INDEPENDENT_AMBULATORY_CARE_PROVIDER_SITE_OTHER): Payer: Medicare Other | Admitting: Pulmonary Disease

## 2011-09-23 VITALS — BP 140/82 | HR 91 | Temp 97.9°F | Ht 65.0 in | Wt 147.2 lb

## 2011-09-23 DIAGNOSIS — J449 Chronic obstructive pulmonary disease, unspecified: Secondary | ICD-10-CM

## 2011-09-23 NOTE — Progress Notes (Signed)
  Subjective:    Patient ID: [name], female    DOB: 06-14-1930, 77 y.o.   MRN: [id number]  HPI The patient comes in today for followup of her known severe emphysema.  She has been doing quite well on her current medical regimen, but has noticed that she is not breathing quite as well as a year ago.  She admits that she has not been sticking to her exercise program, and suspects this is the difference.  She has not had an acute exacerbation since her last visit, and has not required frequent use of her rescue inhaler.   Review of Systems  Constitutional: Negative.  Negative for fever and unexpected weight change.  HENT: Positive for postnasal drip. Negative for ear pain, nosebleeds, congestion, sore throat, rhinorrhea, sneezing, trouble swallowing, dental problem and sinus pressure.   Eyes: Negative.  Negative for redness and itching.  Respiratory: Positive for cough and shortness of breath. Negative for chest tightness and wheezing.   Cardiovascular: Negative.  Negative for palpitations and leg swelling.  Gastrointestinal: Negative.  Negative for nausea and vomiting.  Genitourinary: Negative.  Negative for dysuria.  Musculoskeletal: Negative.  Negative for joint swelling.  Skin: Negative.  Negative for rash.  Neurological: Negative.  Negative for headaches.  Hematological: Negative.  Does not bruise/bleed easily.  Psychiatric/Behavioral: Negative.  Negative for dysphoric mood. The patient is not nervous/anxious.        Objective:   Physical Exam Well-developed female in no acute distress Nose without purulence or discharge noted Chest with decreased breath sounds, no wheezes or rhonchi Cardiac exam with regular rate and rhythm Lower extremities with no edema, no cyanosis noted Alert and oriented, moves all 4 extremities.        Assessment & Plan:

## 2011-09-23 NOTE — Assessment & Plan Note (Signed)
The patient is doing fairly well from a pulmonary standpoint.  Her exercise tolerance has declined to some degree, but she has not been sticking to her exercise regimen.  She is going to get back to this after the first of the year, and will call us if she does not see an improvement.  Have asked her to stay on her current bronchodilator regimen.

## 2011-09-23 NOTE — Patient Instructions (Signed)
No change in meds. Get back to your exercise program followup with me in 6mos, but call if you are not doing as well.

## 2011-11-21 DIAGNOSIS — J449 Chronic obstructive pulmonary disease, unspecified: Secondary | ICD-10-CM | POA: Diagnosis not present

## 2011-11-21 DIAGNOSIS — K219 Gastro-esophageal reflux disease without esophagitis: Secondary | ICD-10-CM | POA: Diagnosis not present

## 2011-11-21 DIAGNOSIS — M810 Age-related osteoporosis without current pathological fracture: Secondary | ICD-10-CM | POA: Diagnosis not present

## 2011-11-21 DIAGNOSIS — E782 Mixed hyperlipidemia: Secondary | ICD-10-CM | POA: Diagnosis not present

## 2011-11-21 DIAGNOSIS — I10 Essential (primary) hypertension: Secondary | ICD-10-CM | POA: Diagnosis not present

## 2011-11-21 DIAGNOSIS — C50919 Malignant neoplasm of unspecified site of unspecified female breast: Secondary | ICD-10-CM | POA: Diagnosis not present

## 2011-12-31 ENCOUNTER — Other Ambulatory Visit (HOSPITAL_BASED_OUTPATIENT_CLINIC_OR_DEPARTMENT_OTHER): Payer: Medicare Other | Admitting: Lab

## 2011-12-31 ENCOUNTER — Telehealth: Payer: Self-pay | Admitting: Hematology & Oncology

## 2011-12-31 ENCOUNTER — Ambulatory Visit (HOSPITAL_BASED_OUTPATIENT_CLINIC_OR_DEPARTMENT_OTHER): Payer: Medicare Other | Admitting: Hematology & Oncology

## 2011-12-31 VITALS — BP 140/81 | HR 80 | Temp 97.1°F | Ht 65.0 in | Wt 145.0 lb

## 2011-12-31 DIAGNOSIS — C50519 Malignant neoplasm of lower-outer quadrant of unspecified female breast: Secondary | ICD-10-CM | POA: Diagnosis not present

## 2011-12-31 DIAGNOSIS — E559 Vitamin D deficiency, unspecified: Secondary | ICD-10-CM | POA: Diagnosis not present

## 2011-12-31 DIAGNOSIS — M810 Age-related osteoporosis without current pathological fracture: Secondary | ICD-10-CM

## 2011-12-31 DIAGNOSIS — Z171 Estrogen receptor negative status [ER-]: Secondary | ICD-10-CM | POA: Diagnosis not present

## 2011-12-31 DIAGNOSIS — C50919 Malignant neoplasm of unspecified site of unspecified female breast: Secondary | ICD-10-CM

## 2011-12-31 DIAGNOSIS — L723 Sebaceous cyst: Secondary | ICD-10-CM

## 2011-12-31 LAB — CBC WITH DIFFERENTIAL (CANCER CENTER ONLY)
BASO#: 0 10*3/uL (ref 0.0–0.2)
BASO%: 0.4 % (ref 0.0–2.0)
Eosinophils Absolute: 0.1 10*3/uL (ref 0.0–0.5)
HCT: 38.4 % (ref 34.8–46.6)
HGB: 12.8 g/dL (ref 11.6–15.9)
LYMPH#: 1.3 10*3/uL (ref 0.9–3.3)
LYMPH%: 23.5 % (ref 14.0–48.0)
MCV: 91 fL (ref 81–101)
MONO#: 0.5 10*3/uL (ref 0.1–0.9)
NEUT%: 66.1 % (ref 39.6–80.0)
RBC: 4.22 10*6/uL (ref 3.70–5.32)
WBC: 5.3 10*3/uL (ref 3.9–10.0)

## 2011-12-31 NOTE — Progress Notes (Signed)
CC:   Quita Skye. [name], M.D. [name], MD,FCCP [name]. [name], M.D.  DIAGNOSIS: 1. 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 2. Severe chronic obstructive pulmonary disease.  CURRENT THERAPY:  Observation.  INTERIM HISTORY:  Ms. Puskas comes in for followup.  She comes in yearly.  She comes in with her sister.  I see her sister at the same time.  Ms. Ferg has been doing okay.  She still has issues with her underlying COPD.  Dr. Shelle Iron is helping her out with this.  She has had no pneumonia.  She has not been hospitalized since we last saw her.  She is not complaining much in the way of back discomfort.  When we last saw her, she had some issues with sciatica.  She has had no headache.  She has had no productive cough.  Her last mammogram was done back in September 2012.  Everything looked okay on the mammogram.  PHYSICAL EXAM:  This is a thin but well-nourished white female in no obvious distress.  Vital signs:  97.1, pulse 80, respiratory rate 18, blood pressure 140/81.  Weight is 145.  Head and neck:  Normocephalic, atraumatic skull.  There are no ocular or oral lesions.  There are no palpable cervical or supraclavicular lymph nodes.  Lungs:  Clear bilaterally.  Cardiac:  Regular rate and rhythm with a normal S1 and S2. There are no murmurs, rubs or bruits.  Breasts:  Shows right breast with no masses, edema or erythema.  There is no right axillary adenopathy. Left breast shows well-healed lumpectomy at the 4 o'clock position.  She has no masses in the left breast.  There may be some slight contraction of the left breast.  There is no left axillary adenopathy.  Abdomen: Soft with good bowel sounds.  There is no fluid wave.  There is no palpable hepatosplenomegaly.  Back:  Does show a sebaceous cyst in the left lateral chest wall.  This is slightly tender.  It is not erythematous.  There is no kyphosis or osteoporotic changes. Extremities:   No clubbing, cyanosis or edema.  She has some age-related arthritic changes.  LABORATORY STUDIES:  White cell count is 5.3, hemoglobin 12.8, hematocrit 38.4, platelet count 232.  IMPRESSION:  Ms. Maiorana is a very nice 78 year old white female with history of stage I infiltrating ductal carcinoma of the left breast. She is TRIPLE NEGATIVE.  She underwent lumpectomy back in August of 2007.  She did not require any adjuvant therapy given the small size of her tumor and her other health issues.  She is doing well.  I think that her risk of recurrence is going to be less than 5%.  We will continue to see her back yearly.  We will also refer her to Dr. [name] of Surgery to see about this sebaceous cyst that needs to be removed.  It has been a tough spot that can cause irritation and then this could lead to infection.    ______________________________ Josph Macho, M.D. PRE/MEDQ  D:  12/31/2011  T:  12/31/2011  Job:  [id number]

## 2011-12-31 NOTE — Progress Notes (Signed)
This office note has been dictated.

## 2011-12-31 NOTE — Telephone Encounter (Signed)
Beth at Dr. Jacinto Halim office had a patient and will schedule referral later. She will call Patient and leave me message. Pt aware

## 2012-01-01 ENCOUNTER — Telehealth: Payer: Self-pay | Admitting: Hematology & Oncology

## 2012-01-01 LAB — COMPREHENSIVE METABOLIC PANEL
ALT: 26 U/L (ref 0–35)
BUN: 11 mg/dL (ref 6–23)
CO2: 29 mEq/L (ref 19–32)
Calcium: 9.4 mg/dL (ref 8.4–10.5)
Chloride: 104 mEq/L (ref 96–112)
Creatinine, Ser: 0.57 mg/dL (ref 0.50–1.10)
Glucose, Bld: 83 mg/dL (ref 70–99)
Total Bilirubin: 0.4 mg/dL (ref 0.3–1.2)

## 2012-01-01 LAB — VITAMIN D 25 HYDROXY (VIT D DEFICIENCY, FRACTURES): Vit D, 25-Hydroxy: 63 ng/mL (ref 30–89)

## 2012-01-01 NOTE — Telephone Encounter (Signed)
Left pt message with 5-2 appointment with Dr. [name]

## 2012-01-06 ENCOUNTER — Telehealth: Payer: Self-pay | Admitting: *Deleted

## 2012-01-06 NOTE — Telephone Encounter (Addendum)
Message copied by Mirian Capuchin on Mon Jan 06, 2012  2:04 PM ------      Message from: Arlan Organ R      Created: Wed Jan 01, 2012  9:28 PM       Call - labs ok.  Pete Pt. Given this message.  Voiced understanding.

## 2012-01-23 ENCOUNTER — Ambulatory Visit (INDEPENDENT_AMBULATORY_CARE_PROVIDER_SITE_OTHER): Payer: Medicare Other | Admitting: General Surgery

## 2012-01-30 ENCOUNTER — Encounter (INDEPENDENT_AMBULATORY_CARE_PROVIDER_SITE_OTHER): Payer: Self-pay | Admitting: General Surgery

## 2012-01-30 ENCOUNTER — Ambulatory Visit (INDEPENDENT_AMBULATORY_CARE_PROVIDER_SITE_OTHER): Payer: Medicare Other | Admitting: General Surgery

## 2012-01-30 VITALS — BP 146/76 | HR 90 | Temp 98.4°F | Ht 66.5 in | Wt 145.6 lb

## 2012-01-30 DIAGNOSIS — R222 Localized swelling, mass and lump, trunk: Secondary | ICD-10-CM

## 2012-01-30 NOTE — Progress Notes (Signed)
Patient ID: [name], female   DOB: 1930/08/10, 78 y.o.   MRN: [id number]  Chief Complaint  Patient presents with  . Pre-op Exam    eval seb cyst     HPI [name] is a 78 y.o. female.  She is referred by Dr. Arlan Organ for evaluation and management of a soft tissue mass of the left chest wall.  The patient has a history of left partial mastectomy and lymph node dissection by Dr. Maryagnes Amos in 2007. Pathologic stage of her breast cancer was T1b, N0. Triple negative tumor. Followed by Dr. Arlan Organ. MGM normal Sept., 2012.  She has moderately severe COPD and is followed by Marcelyn Bruins.   She has hypertension and is followed by Dr. Tyson Dense.  She recently had an infected cyst on her left face and Dr. Terri Piedra is taking care that. it was somewhat of an ordeal. She has a large subcutaneous mass in the left chest wall. It has been there for 10 years. She is afraid it will get infected and she wants to have it removed electively. HPI  Past Medical History  Diagnosis Date  . Other emphysema   . Unspecified essential hypertension   . Osteoporosis, unspecified   . Other and unspecified hyperlipidemia   . Esophageal reflux   . Unspecified asthma   . Cancer   . Arthritis   . Emphysema of lung     Past Surgical History  Procedure Date  . Partial hysterectomy   . Mastectomy   . Cataract extraction   . Hip surgery   . Breast surgery   . Abdominal hysterectomy   . Cyst on face 6 months ago    Family History  Problem Relation Age of Onset  . Coronary artery disease Mother   . Diabetes Father   . Breast cancer Sister   . Kidney cancer Brother   . COPD      Social History History  Substance Use Topics  . Smoking status: Former Smoker -- 1.5 packs/day for 40 years    Types: Cigarettes    Quit date: 09/24/1995  . Smokeless tobacco: Not on file  . Alcohol Use: No    Allergies  Allergen Reactions  . Neosporin (Neomycin-Polymyxin) Itching and Rash  . Codeine       REACTION: unspecified  . Irbesartan     Avapro  REACTION: reaction not known    Current Outpatient Prescriptions  Medication Sig Dispense Refill  . albuterol (PROVENTIL HFA) 108 (90 BASE) MCG/ACT inhaler Inhale 2 puffs into the lungs every 6 (six) hours as needed.        Marland Kitchen albuterol (PROVENTIL) (2.5 MG/3ML) 0.083% nebulizer solution Take 2.5 mg by nebulization every 6 (six) hours as needed.        Marland Kitchen amLODipine (NORVASC) 5 MG tablet Take 5 mg by mouth daily.        Marland Kitchen aspirin 81 MG tablet Take 81 mg by mouth daily.        Marland Kitchen atorvastatin (LIPITOR) 10 MG tablet Take 10 mg by mouth daily.        . calcitonin, salmon, (MIACALCIN/FORTICAL) 200 UNIT/ACT nasal spray every morning.      . Calcium Carbonate-Vit D-Min (CALTRATE 600+D PLUS) 600-400 MG-UNIT per tablet Take 1 tablet by mouth daily.        . Cholecalciferol (VITAMIN D) 1000 UNITS capsule Take 1,000 Units by mouth daily.        . fluocinonide cream (LIDEX) 0.05 %  every morning.      . Fluticasone-Salmeterol (ADVAIR DISKUS) 250-50 MCG/DOSE AEPB Inhale 1 puff into the lungs every 12 (twelve) hours.  180 each  4  . HYDROcodone-homatropine (HYCODAN) 5-1.5 MG/5ML syrup every morning.      . Multiple Vitamin (MULTIVITAMIN) capsule Take 1 capsule by mouth daily.        . Omega-3 Fatty Acids (FISH OIL) 300 MG CAPS Take by mouth.        Marland Kitchen omeprazole (PRILOSEC) 20 MG capsule Take 20 mg by mouth daily.        . ramipril (ALTACE) 10 MG capsule Take 10 mg by mouth daily.        Marland Kitchen tiotropium (SPIRIVA) 18 MCG inhalation capsule Place 1 capsule (18 mcg total) into inhaler and inhale daily.  90 capsule  4    Review of Systems Review of Systems  Constitutional: Negative for fever, chills and unexpected weight change.  HENT: Negative for hearing loss, congestion, sore throat, trouble swallowing and voice change.   Eyes: Negative for visual disturbance.  Respiratory: Positive for cough and shortness of breath. Negative for wheezing.   Cardiovascular:  Negative for chest pain, palpitations and leg swelling.  Gastrointestinal: Negative for nausea, vomiting, abdominal pain, diarrhea, constipation, blood in stool, abdominal distention and anal bleeding.  Genitourinary: Negative for hematuria, vaginal bleeding and difficulty urinating.  Musculoskeletal: Negative for arthralgias.  Skin: Negative for rash and wound.  Neurological: Negative for seizures, syncope and headaches.  Hematological: Negative for adenopathy. Does not bruise/bleed easily.  Psychiatric/Behavioral: Negative for confusion.    Blood pressure 146/76, pulse 90, temperature 98.4 F (36.9 C), temperature source Temporal, height 5' 6.5" (1.689 m), weight 145 lb 9.6 oz (66.044 kg), SpO2 93.00%.  Physical Exam Physical Exam  Constitutional: She is oriented to person, place, and time. She appears well-developed and well-nourished. No distress.  HENT:  Head: Normocephalic and atraumatic.  Nose: Nose normal.  Mouth/Throat: No oropharyngeal exudate.  Eyes: Conjunctivae and EOM are normal. Pupils are equal, round, and reactive to light. Left eye exhibits no discharge. No scleral icterus.  Neck: Neck supple. No JVD present. No tracheal deviation present. No thyromegaly present.  Cardiovascular: Normal rate, regular rhythm, normal heart sounds and intact distal pulses.   No murmur heard. Pulmonary/Chest: Effort normal and breath sounds normal. No respiratory distress. She has no wheezes. She has no rales. She exhibits no tenderness.       Well-healed scars left breast and left axilla. No breast mass. No axillary adenopathy.  2.5 cm subcutaneous mass left lateral chest wall, and mid axillary line. This is somewhat mobile. Small dimple over this. Physical characteristics suggest pilar cyst.  Breath sounds are distant but there is no wheezing.  Abdominal: Soft. Bowel sounds are normal. She exhibits no distension and no mass. There is no tenderness. There is no rebound and no guarding.    Musculoskeletal: She exhibits no edema and no tenderness.  Lymphadenopathy:    She has no cervical adenopathy.  Neurological: She is alert and oriented to person, place, and time. She exhibits normal muscle tone. Coordination normal.  Skin: Skin is warm. No rash noted. She is not diaphoretic. No erythema. No pallor.  Psychiatric: She has a normal mood and affect. Her behavior is normal. Judgment and thought content normal.    Data Reviewed No outside data available. Office notes from Dr. Jeremy Johann and Dr. Myna Hidalgo review from Epic.  Assessment    2.5 cm mass left chest wall, mid axillary line.  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  Past history left partial mastectomy and lymph node dissection for stage I breast cancer, triple negative, no known  recurrence to date.  Recent infected cyst left face, resolving  Severe COPD  Hypertension.    Plan    Scheduled for excision 2.5 cm left chest wall mass under local anesthesia in the near future.  Indications, details, techniques, and risk discussed with the patient in detail. She understands these issues. Her questions were answered. She agrees with this plan.       [name]. [name], M.D., [facility] General and Minimally invasive Surgery Breast and Colorectal Surgery Office:   [phone] Pager:   [phone]  01/30/2012, 10:19 AM

## 2012-01-30 NOTE — Patient Instructions (Signed)
You have a 2.5 cm subcutaneous mass of the left chest wall. This is most likely a chronic cyst and is unlikely to be recurrent breast cancer.  You will be scheduled for surgery to excise this in the near future.

## 2012-02-14 NOTE — H&P (Signed)
[name]     MRN: [id number]   Description: 78 year old female  Provider: Ernestene Mention, MD  Department: [facility]       Diagnoses     Chest wall mass   - Primary    786.6      Reason for Visit     Pre-op Exam    eval seb cyst        Vitals      BP Pulse Temp(Src) Ht Wt BMI    146/76  90  98.4 F (36.9 C) (Temporal)  5' 6.5" (1.689 m)  145 lb 9.6 oz (66.044 kg)  23.15 kg/m2       History and Physical     Ernestene Mention, MD   Patient ID: [name], female   DOB: 1929/12/28, 78 y.o.   MRN: [id number]           HPI [name] is a 78 y.o. female.  She is referred by Dr. Arlan Organ for evaluation and management of a soft tissue mass of the left chest wall.  The patient has a history of left partial mastectomy and lymph node dissection by Dr. Maryagnes Amos in 2007. Pathologic stage of her breast cancer was T1b, N0. Triple negative tumor. Followed by Dr. Arlan Organ. MGM normal Sept., 2012.  She has moderately severe COPD and is followed by Marcelyn Bruins.   She has hypertension and is followed by Dr. Tyson Dense.  She recently had an infected cyst on her left face and Dr. Terri Piedra is taking care that. it was somewhat of an ordeal. She has a large subcutaneous mass in the left chest wall. It has been there for 10 years. She is afraid it will get infected and she wants to have it removed electively.      Past Medical History   Diagnosis  Date   .  Other emphysema     .  Unspecified essential hypertension     .  Osteoporosis, unspecified     .  Other and unspecified hyperlipidemia     .  Esophageal reflux     .  Unspecified asthma     .  Cancer     .  Arthritis     .  Emphysema of lung         Past Surgical History   Procedure  Date   .  Partial hysterectomy     .  Mastectomy     .  Cataract extraction     .  Hip surgery     .  Breast surgery     .  Abdominal hysterectomy     .  Cyst on face  6 months ago       Family History     Problem  Relation  Age of Onset   .  Coronary artery disease  Mother     .  Diabetes  Father     .  Breast cancer  Sister     .  Kidney cancer  Brother     .  COPD          Social History History   Substance Use Topics   .  Smoking status:  Former Smoker -- 1.5 packs/day for 40 years       Types:  Cigarettes       Quit date:  09/24/1995   .  Smokeless tobacco:  Not on file   .  Alcohol  Use:  No       Allergies   Allergen  Reactions   .  Neosporin (Neomycin-Polymyxin)  Itching and Rash   .  Codeine         REACTION: unspecified   .  Irbesartan         Avapro  REACTION: reaction not known       Current Outpatient Prescriptions   Medication  Sig  Dispense  Refill   .  albuterol (PROVENTIL HFA) 108 (90 BASE) MCG/ACT inhaler  Inhale 2 puffs into the lungs every 6 (six) hours as needed.           Marland Kitchen  albuterol (PROVENTIL) (2.5 MG/3ML) 0.083% nebulizer solution  Take 2.5 mg by nebulization every 6 (six) hours as needed.           Marland Kitchen  amLODipine (NORVASC) 5 MG tablet  Take 5 mg by mouth daily.           Marland Kitchen  aspirin 81 MG tablet  Take 81 mg by mouth daily.           Marland Kitchen  atorvastatin (LIPITOR) 10 MG tablet  Take 10 mg by mouth daily.           .  calcitonin, salmon, (MIACALCIN/FORTICAL) 200 UNIT/ACT nasal spray  every morning.         .  Calcium Carbonate-Vit D-Min (CALTRATE 600+D PLUS) 600-400 MG-UNIT per tablet  Take 1 tablet by mouth daily.           .  Cholecalciferol (VITAMIN D) 1000 UNITS capsule  Take 1,000 Units by mouth daily.           .  fluocinonide cream (LIDEX) 0.05 %  every morning.         .  Fluticasone-Salmeterol (ADVAIR DISKUS) 250-50 MCG/DOSE AEPB  Inhale 1 puff into the lungs every 12 (twelve) hours.   180 each   4   .  HYDROcodone-homatropine (HYCODAN) 5-1.5 MG/5ML syrup  every morning.         .  Multiple Vitamin (MULTIVITAMIN) capsule  Take 1 capsule by mouth daily.           .  Omega-3 Fatty Acids (FISH OIL) 300 MG CAPS  Take by mouth.           Marland Kitchen  omeprazole  (PRILOSEC) 20 MG capsule  Take 20 mg by mouth daily.           .  ramipril (ALTACE) 10 MG capsule  Take 10 mg by mouth daily.           Marland Kitchen  tiotropium (SPIRIVA) 18 MCG inhalation capsule  Place 1 capsule (18 mcg total) into inhaler and inhale daily.   90 capsule   4      Review of Systems  Constitutional: Negative for fever, chills and unexpected weight change.  HENT: Negative for hearing loss, congestion, sore throat, trouble swallowing and voice change.   Eyes: Negative for visual disturbance.  Respiratory: Positive for cough and shortness of breath. Negative for wheezing.   Cardiovascular: Negative for chest pain, palpitations and leg swelling.  Gastrointestinal: Negative for nausea, vomiting, abdominal pain, diarrhea, constipation, blood in stool, abdominal distention and anal bleeding.  Genitourinary: Negative for hematuria, vaginal bleeding and difficulty urinating.  Musculoskeletal: Negative for arthralgias.  Skin: Negative for rash and wound.  Neurological: Negative for seizures, syncope and headaches.  Hematological: Negative for adenopathy. Does not bruise/bleed easily.  Psychiatric/Behavioral: Negative for confusion.  Blood pressure 146/76, pulse 90, temperature 98.4 F (36.9 C), temperature source Temporal, height 5' 6.5" (1.689 m), weight 145 lb 9.6 oz (66.044 kg), SpO2 93.00%.   Physical Exam  Constitutional: She is oriented to person, place, and time. She appears well-developed and well-nourished. No distress.  HENT:   Head: Normocephalic and atraumatic.   Nose: Nose normal.   Mouth/Throat: No oropharyngeal exudate.  Eyes: Conjunctivae and EOM are normal. Pupils are equal, round, and reactive to light. Left eye exhibits no discharge. No scleral icterus.  Neck: Neck supple. No JVD present. No tracheal deviation present. No thyromegaly present.  Cardiovascular: Normal rate, regular rhythm, normal heart sounds and intact distal pulses.    No murmur  heard. Pulmonary/Chest: Effort normal and breath sounds normal. No respiratory distress. She has no wheezes. She has no rales. She exhibits no tenderness.       Well-healed scars left breast and left axilla. No breast mass. No axillary adenopathy.  2.5 cm subcutaneous mass left lateral chest wall, and mid axillary line. This is somewhat mobile. Small dimple over this. Physical characteristics suggest pilar cyst.  Breath sounds are distant but there is no wheezing.  Abdominal: Soft. Bowel sounds are normal. She exhibits no distension and no mass. There is no tenderness. There is no rebound and no guarding.  Musculoskeletal: She exhibits no edema and no tenderness.  Lymphadenopathy:    She has no cervical adenopathy.  Neurological: She is alert and oriented to person, place, and time. She exhibits normal muscle tone. Coordination normal.  Skin: Skin is warm. No rash noted. She is not diaphoretic. No erythema. No pallor.  Psychiatric: She has a normal mood and affect. Her behavior is normal. Judgment and thought content normal.     .   Assessment 2.5 cm mass left chest wall, mid axillary line. 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  Past history left partial mastectomy and lymph node dissection for stage I breast cancer, triple negative, no known  recurrence to date.  Recent infected cyst left face, resolving  Severe COPD  Hypertension.   Plan Scheduled for excision 2.5 cm left chest wall mass under local anesthesia in the near future.   Indications, details, techniques, and risk discussed with the patient in detail. She understands these issues. Her questions were answered. She agrees with this plan.       [name]. [name], M.D., [facility] General and Minimally invasive Surgery Breast and Colorectal Surgery Office:   [phone] Pager:   [phone]

## 2012-02-18 ENCOUNTER — Encounter (HOSPITAL_BASED_OUTPATIENT_CLINIC_OR_DEPARTMENT_OTHER): Payer: Self-pay | Admitting: *Deleted

## 2012-02-18 ENCOUNTER — Encounter (HOSPITAL_BASED_OUTPATIENT_CLINIC_OR_DEPARTMENT_OTHER)
Admission: RE | Disposition: A | Discharge status: Home / Self Care | Payer: Self-pay | Source: Ambulatory Visit | Attending: General Surgery

## 2012-02-18 ENCOUNTER — Ambulatory Visit (HOSPITAL_BASED_OUTPATIENT_CLINIC_OR_DEPARTMENT_OTHER)
Admission: RE | Admit: 2012-02-18 | Discharge: 2012-02-18 | Disposition: A | Discharge status: Home / Self Care | Payer: Medicare Other | Source: Ambulatory Visit | Attending: General Surgery | Admitting: General Surgery

## 2012-02-18 DIAGNOSIS — L723 Sebaceous cyst: Secondary | ICD-10-CM | POA: Diagnosis not present

## 2012-02-18 DIAGNOSIS — D235 Other benign neoplasm of skin of trunk: Secondary | ICD-10-CM | POA: Insufficient documentation

## 2012-02-18 HISTORY — PX: MASS EXCISION: SHX2000

## 2012-02-18 SURGERY — MINOR EXCISION OF MASS
Anesthesia: LOCAL | Site: Chest | Laterality: Left | Wound class: Clean

## 2012-02-18 MED ORDER — CHLORHEXIDINE GLUCONATE 4 % EX LIQD: 1.0000 "application " | Freq: Once | CUTANEOUS | Status: DC

## 2012-02-18 MED ORDER — SODIUM BICARBONATE 4 % IV SOLN
INTRAVENOUS | Status: DC | PRN
  Administered 2012-02-18: 12:00:00 via INTRAMUSCULAR

## 2012-02-18 MED ORDER — HYDROCODONE-ACETAMINOPHEN 5-325 MG PO TABS: 1.0000 | ORAL_TABLET | ORAL | Status: AC | PRN

## 2012-02-18 SURGICAL SUPPLY — 34 items
BENZOIN TINCTURE PRP APPL 2/3 (GAUZE/BANDAGES/DRESSINGS) IMPLANT
BLADE SURG 15 STRL LF DISP TIS (BLADE) ×1 IMPLANT
BLADE SURG 15 STRL SS (BLADE) ×1
CLOTH BEACON ORANGE TIMEOUT ST (SAFETY) ×2 IMPLANT
COTTONBALL LRG STERILE PKG (GAUZE/BANDAGES/DRESSINGS) IMPLANT
DERMABOND ADVANCED (GAUZE/BANDAGES/DRESSINGS)
DERMABOND ADVANCED .7 DNX12 (GAUZE/BANDAGES/DRESSINGS) IMPLANT
DRAPE SURG 17X23 STRL (DRAPES) IMPLANT
ELECT REM PT RETURN 9FT ADLT (ELECTROSURGICAL)
ELECTRODE REM PT RTRN 9FT ADLT (ELECTROSURGICAL) IMPLANT
GAUZE SPONGE 4X4 12PLY STRL LF (GAUZE/BANDAGES/DRESSINGS) IMPLANT
GLOVE BIO SURGEON STRL SZ7 (GLOVE) ×2 IMPLANT
GLOVE EUDERMIC 7 POWDERFREE (GLOVE) ×2 IMPLANT
MARKER SKIN DUAL TIP RULER LAB (MISCELLANEOUS) ×2 IMPLANT
NDL SAFETY ECLIPSE 18X1.5 (NEEDLE) ×1 IMPLANT
NEEDLE HYPO 18GX1.5 SHARP (NEEDLE) ×1
NEEDLE HYPO 25X1 1.5 SAFETY (NEEDLE) ×2 IMPLANT
NS IRRIG 1000ML POUR BTL (IV SOLUTION) ×2 IMPLANT
PENCIL BUTTON HOLSTER BLD 10FT (ELECTRODE) IMPLANT
STRIP CLOSURE SKIN 1/2X4 (GAUZE/BANDAGES/DRESSINGS) IMPLANT
SUT MNCRL AB 3-0 PS2 18 (SUTURE) IMPLANT
SUT MON AB 4-0 PC3 18 (SUTURE) IMPLANT
SUT SILK 3 0 TIES 17X18 (SUTURE)
SUT SILK 3-0 18XBRD TIE BLK (SUTURE) IMPLANT
SUT VIC AB 4-0 P-3 18XBRD (SUTURE) IMPLANT
SUT VIC AB 4-0 P3 18 (SUTURE)
SUT VIC AB 5-0 P-3 18X BRD (SUTURE) IMPLANT
SUT VIC AB 5-0 P3 18 (SUTURE)
SUT VICRYL 3-0 CR8 SH (SUTURE) IMPLANT
SUT VICRYL AB 3 0 TIES (SUTURE) IMPLANT
SWABSTICK POVIDONE IODINE SNGL (MISCELLANEOUS) IMPLANT
SYR CONTROL 10ML LL (SYRINGE) ×2 IMPLANT
TOWEL OR 17X24 6PK STRL BLUE (TOWEL DISPOSABLE) IMPLANT
TRAY DSU PREP LF (CUSTOM PROCEDURE TRAY) IMPLANT

## 2012-02-18 NOTE — Op Note (Signed)
Patient Name:           [name]   Date of Surgery:        02/18/2012  Pre op Diagnosis:      2 cm soft tissue mass, left chest wall, so that suspect pilar cyst  Post op Diagnosis:    same  Procedure:                 Excision 2 cm soft tissue mass left chest wall  Surgeon:                     [name]. [name], M.D., FACS  Assistant:                      none  Operative Indications:   This 78 year old Caucasian female has a history of cancer of the left breast treated with with left lumpectomy and lymph node dissection in 2007. This was a T1b,  N0, triple negative tumor. She has no known recurrence to date. Mammograms in September were normal. She has a soft tissue mass of the left chest wall which has been there for some time. Evaluation as an outpatient suggested this is a benign pilar cyst. She would like this removed before it gets infected. She's been counseled regarding this and is brought to [facility] electively.  Operative Findings:       The patient had a 2 cm subcutaneous mass of the left lateral chest wall. Gross findings were consistent with a benign pilar cyst.  Procedure in Detail:          The patient was brought to room 8 at [facility] day surgery center. The procedure was done under local anesthesia. Surgical time out was performed. The site was marked. The patient was placed in a right lateral decubitus position. The left chest wall was prepped and draped in a sterile fashion. 1% Xylocaine with epinephrine was used as a local infiltration anesthetic. A transverse elliptical incision was made. Dissection was carried down into the subcutaneous tissue and around what appeared to be a benign pilar cyst.   The entire cyst and cyst wall were  removed. The wound was copiously irrigated with saline. The skin was closed loosely with 2 interrupted sutures of 3-0 nylon. Clean bandages were placed. The patient tolerated the procedure very well. EBL 5 cc. Counts correct. No  complications.     [name]. [name], M.D., FACS General and Minimally Invasive Surgery Breast and Colorectal Surgery  02/18/2012 11:43 AM

## 2012-02-18 NOTE — Discharge Instructions (Signed)
Call Dr. Jacinto Halim office and make an appointment in 7 or 8 days for suture removal.  You may take a shower, starting Thursday morning. Otherwise keep the wound clean and dried and covered with dry gauze. No ointments are necessary.  You may drive your  car.  Avoid sports, sweating, or strenuous activities for 2 weeks.

## 2012-02-19 NOTE — Progress Notes (Signed)
Quick Note:  Inform patient of Pathology report,. ______ 

## 2012-02-20 ENCOUNTER — Encounter (HOSPITAL_BASED_OUTPATIENT_CLINIC_OR_DEPARTMENT_OTHER): Payer: Self-pay | Admitting: General Surgery

## 2012-02-26 ENCOUNTER — Encounter (INDEPENDENT_AMBULATORY_CARE_PROVIDER_SITE_OTHER): Payer: Self-pay | Admitting: General Surgery

## 2012-02-26 ENCOUNTER — Ambulatory Visit (INDEPENDENT_AMBULATORY_CARE_PROVIDER_SITE_OTHER): Payer: Medicare Other

## 2012-02-26 VITALS — BP 158/78 | HR 72 | Temp 97.1°F | Resp 22 | Wt 145.0 lb

## 2012-02-26 DIAGNOSIS — Z4802 Encounter for removal of sutures: Secondary | ICD-10-CM

## 2012-02-26 NOTE — Progress Notes (Signed)
Pt in office for suture removal. Wound clean,dry, healing well. Sutures removed. Steri strips applied. Pt to keep future appt and call with  Any concerns.

## 2012-03-16 ENCOUNTER — Ambulatory Visit (INDEPENDENT_AMBULATORY_CARE_PROVIDER_SITE_OTHER): Payer: Medicare Other | Admitting: General Surgery

## 2012-03-16 ENCOUNTER — Encounter (INDEPENDENT_AMBULATORY_CARE_PROVIDER_SITE_OTHER): Payer: Self-pay | Admitting: General Surgery

## 2012-03-16 VITALS — BP 140/80 | HR 72 | Temp 97.6°F | Resp 20 | Ht 66.0 in | Wt 146.4 lb

## 2012-03-16 DIAGNOSIS — Z9889 Other specified postprocedural states: Secondary | ICD-10-CM

## 2012-03-16 NOTE — Progress Notes (Signed)
Subjective:     Patient ID: [name], female   DOB: [birth year], 78 y.o.   MRN: [id number]  HPI Recent left chest wall mass excised. Pathology shows benign epidermal cyst. Pathology report discussed and given to patient. She has no complaints.   Review of Systems     Objective:   Physical Exam Patient in good spirits.   Left chest wall scar healing nicely.    Assessment:     Epidermal cyst left chest wall, uneventful healing.  History left breast cancer 2007, status post left PM/SLN for stage T1b, No, triple negative cancer. No known recurrence to date.    Plan:     Continue annual mammograms and followup with Dr. Myna Hidalgo, as usual.  See me PRN.   [name]. [name], M.D., [facility] General and Minimally invasive Surgery Breast and Colorectal Surgery Office:   [phone] Pager:   [phone]

## 2012-03-16 NOTE — Patient Instructions (Signed)
The left chest wall mass that was removed recently is a benign epidermal cyst.The wound appears to be healing normally.   Be sure to get your mammograms annually and see Dr. Arlan Organ annually.  Return to Dr. [name] if further problems arise.

## 2012-03-18 ENCOUNTER — Ambulatory Visit (INDEPENDENT_AMBULATORY_CARE_PROVIDER_SITE_OTHER): Payer: Medicare Other | Admitting: Pulmonary Disease

## 2012-03-18 ENCOUNTER — Encounter: Payer: Self-pay | Admitting: Pulmonary Disease

## 2012-03-18 VITALS — BP 110/68 | HR 84 | Temp 97.4°F | Ht 66.0 in | Wt 143.6 lb

## 2012-03-18 DIAGNOSIS — J449 Chronic obstructive pulmonary disease, unspecified: Secondary | ICD-10-CM

## 2012-03-18 NOTE — Patient Instructions (Addendum)
No change in breathing medications. Continue to stay active, and keep tract of oxygen level during your exercise program.  If you are falling less than 88% a lot during exercise, let me know.  followup with me in 6mos.

## 2012-03-18 NOTE — Assessment & Plan Note (Signed)
The patient is doing well from a pulmonary standpoint on her current bronchodilator regimen.  She has not had an acute exacerbation of pulmonary infection since last visit.  Her sats today after walking from the lobby or 87%, however quickly recovered with rest.  The patient tells me that she monitors her saturations during her rigorous exercise program, and she never drops below 90%.  I have asked her to keep a close eye on this, and let me know if this changes.

## 2012-03-18 NOTE — Progress Notes (Signed)
  Subjective:    Patient ID: [name], female    DOB: [birth year], 78 y.o.   MRN: [id number]  HPI The patient comes in today for followup of her known severe COPD.  She is actually doing quite well, and is exercising on a regular basis.  She monitors her saturations during exercise, and she has not fallen below 88%.  She currently feels that she is at baseline, and denies any cough or significant mucus.   Review of Systems  Constitutional: Negative for fever and unexpected weight change.  HENT: Negative for ear pain, nosebleeds, congestion, sore throat, rhinorrhea, sneezing, trouble swallowing, dental problem, postnasal drip and sinus pressure.   Eyes: Negative for redness and itching.  Respiratory: Positive for cough and shortness of breath. Negative for chest tightness and wheezing.   Cardiovascular: Negative for palpitations and leg swelling.  Gastrointestinal: Negative for nausea and vomiting.  Genitourinary: Negative for dysuria.  Musculoskeletal: Negative for joint swelling.  Skin: Negative for rash.  Neurological: Negative for headaches.  Hematological: Bruises/bleeds easily.  Psychiatric/Behavioral: Negative for dysphoric mood. The patient is not nervous/anxious.        Objective:   Physical Exam Thin female in no acute distress Nose without purulence or discharge noted Chest with mild decrease in breath sounds, no wheezing or rhonchi Cardiac exam with regular rate and rhythm Lower extremities without edema, no cyanosis Alert and oriented, moves all 4 extremities.        Assessment & Plan:

## 2012-03-27 ENCOUNTER — Other Ambulatory Visit: Payer: Self-pay | Admitting: Pulmonary Disease

## 2012-03-27 DIAGNOSIS — B029 Zoster without complications: Secondary | ICD-10-CM | POA: Diagnosis not present

## 2012-03-31 DIAGNOSIS — B029 Zoster without complications: Secondary | ICD-10-CM | POA: Diagnosis not present

## 2012-04-22 DIAGNOSIS — R0609 Other forms of dyspnea: Secondary | ICD-10-CM | POA: Diagnosis not present

## 2012-04-22 DIAGNOSIS — R0989 Other specified symptoms and signs involving the circulatory and respiratory systems: Secondary | ICD-10-CM | POA: Diagnosis not present

## 2012-04-22 DIAGNOSIS — J189 Pneumonia, unspecified organism: Secondary | ICD-10-CM | POA: Diagnosis not present

## 2012-04-22 DIAGNOSIS — J449 Chronic obstructive pulmonary disease, unspecified: Secondary | ICD-10-CM | POA: Diagnosis not present

## 2012-05-20 DIAGNOSIS — C50919 Malignant neoplasm of unspecified site of unspecified female breast: Secondary | ICD-10-CM | POA: Diagnosis not present

## 2012-05-20 DIAGNOSIS — I10 Essential (primary) hypertension: Secondary | ICD-10-CM | POA: Diagnosis not present

## 2012-05-20 DIAGNOSIS — J449 Chronic obstructive pulmonary disease, unspecified: Secondary | ICD-10-CM | POA: Diagnosis not present

## 2012-05-20 DIAGNOSIS — K219 Gastro-esophageal reflux disease without esophagitis: Secondary | ICD-10-CM | POA: Diagnosis not present

## 2012-05-20 DIAGNOSIS — E782 Mixed hyperlipidemia: Secondary | ICD-10-CM | POA: Diagnosis not present

## 2012-05-22 DIAGNOSIS — E782 Mixed hyperlipidemia: Secondary | ICD-10-CM | POA: Diagnosis not present

## 2012-05-22 DIAGNOSIS — Z1331 Encounter for screening for depression: Secondary | ICD-10-CM | POA: Diagnosis not present

## 2012-05-22 DIAGNOSIS — I10 Essential (primary) hypertension: Secondary | ICD-10-CM | POA: Diagnosis not present

## 2012-05-22 DIAGNOSIS — C50919 Malignant neoplasm of unspecified site of unspecified female breast: Secondary | ICD-10-CM | POA: Diagnosis not present

## 2012-05-22 DIAGNOSIS — J449 Chronic obstructive pulmonary disease, unspecified: Secondary | ICD-10-CM | POA: Diagnosis not present

## 2012-05-22 DIAGNOSIS — Z0000 Encounter for general adult medical examination without abnormal findings: Secondary | ICD-10-CM | POA: Diagnosis not present

## 2012-05-22 DIAGNOSIS — M810 Age-related osteoporosis without current pathological fracture: Secondary | ICD-10-CM | POA: Diagnosis not present

## 2012-06-03 ENCOUNTER — Ambulatory Visit (INDEPENDENT_AMBULATORY_CARE_PROVIDER_SITE_OTHER)
Admission: RE | Admit: 2012-06-03 | Discharge: 2012-06-03 | Disposition: A | Discharge status: Home / Self Care | Payer: Medicare Other | Source: Ambulatory Visit | Attending: Pulmonary Disease | Admitting: Pulmonary Disease

## 2012-06-03 ENCOUNTER — Ambulatory Visit (INDEPENDENT_AMBULATORY_CARE_PROVIDER_SITE_OTHER): Payer: Medicare Other | Admitting: Pulmonary Disease

## 2012-06-03 ENCOUNTER — Encounter: Payer: Self-pay | Admitting: Pulmonary Disease

## 2012-06-03 VITALS — BP 140/72 | HR 90 | Temp 98.1°F | Ht 66.0 in | Wt 142.0 lb

## 2012-06-03 DIAGNOSIS — Z23 Encounter for immunization: Secondary | ICD-10-CM

## 2012-06-03 DIAGNOSIS — J4489 Other specified chronic obstructive pulmonary disease: Secondary | ICD-10-CM

## 2012-06-03 DIAGNOSIS — J449 Chronic obstructive pulmonary disease, unspecified: Secondary | ICD-10-CM | POA: Diagnosis not present

## 2012-06-03 NOTE — Patient Instructions (Addendum)
Will check oxygen level overnight, and call you with results. Will check cxr today. Will start on portable oxygen to wear with exertion.  No change in breathing medications.  Will give you flu shot today.  Will see you back at your already scheduled apptm .

## 2012-06-03 NOTE — Assessment & Plan Note (Signed)
The pt feels she is near her usual baseline from a breathing standpoint, but is having a little more nonpurulent mucus production.  She had a chest x-ray in July that showed a questionable lingular infiltrate, but the patient really did not have symptoms consistent with pneumonia.  Because of her mucous production above baseline, I would like to do a followup chest x-ray to ensure complete clearance.  Otherwise, I have asked her to continue her current medications, and to stay active with her exercise program.  I will go ahead and provide her with exertional oxygen, and assessment for nocturnal desaturation.

## 2012-06-03 NOTE — Progress Notes (Signed)
  Subjective:    Patient ID: [name], female    DOB: 29-Aug-1930, 78 y.o.   MRN: [id number]  HPI The patient comes in today for followup of her known COPD.  She was recently diagnosed with pneumonia in July, but states that she did not have purulent mucous or worsening shortness of breath.  The chest x-ray showed a questionable lingular infiltrate.  I have reviewed this, and feel that it is not overly impressive.  The patient feels that her breathing is at her usual baseline, and continues to stay very active.  She has been hesitant to start exertional oxygen in the past because her sats with activity are quite variable.  She did have an issue with the heat this summer, and felt that oxygen may have helped her in those situations.  She currently has no chest congestion or rattling, but is producing a little more mucus than usual that is nonpurulent.  She has not had a followup chest film since being treated with antibiotics.   Review of Systems  Constitutional: Negative for fever and unexpected weight change.  HENT: Positive for congestion, rhinorrhea and sneezing. Negative for ear pain, nosebleeds, sore throat, trouble swallowing, dental problem, postnasal drip and sinus pressure.   Eyes: Negative for redness and itching.  Respiratory: Positive for cough, shortness of breath and wheezing. Negative for chest tightness.   Cardiovascular: Negative for palpitations and leg swelling.  Gastrointestinal: Negative for nausea and vomiting.  Genitourinary: Negative for dysuria.  Musculoskeletal: Negative for joint swelling.  Skin: Negative for rash.  Neurological: Negative for headaches.  Hematological: Bruises/bleeds easily.  Psychiatric/Behavioral: Negative for dysphoric mood. The patient is not nervous/anxious.        Objective:   Physical Exam Well-developed female in no acute distress Nose without purulence or discharge noted Chest with decreased breath sounds throughout, no wheezes or  rhonchi Cardiac exam with regular rate and rhythm Lower extremities with no edema, no cyanosis Alert and oriented, moves all 4 extremities.       Assessment & Plan:

## 2012-06-04 ENCOUNTER — Other Ambulatory Visit: Payer: Self-pay | Admitting: Internal Medicine

## 2012-06-04 DIAGNOSIS — Z853 Personal history of malignant neoplasm of breast: Secondary | ICD-10-CM

## 2012-06-04 DIAGNOSIS — Z9889 Other specified postprocedural states: Secondary | ICD-10-CM

## 2012-06-15 ENCOUNTER — Telehealth: Payer: Self-pay | Admitting: Pulmonary Disease

## 2012-06-15 NOTE — Telephone Encounter (Signed)
Called, spoke with pt -- requesting ONO results from 06/08/12. Aware KC out of office until tomorrow and ok with this.  Dr. Shelle Iron, pls advise.  Thank you.

## 2012-06-16 ENCOUNTER — Other Ambulatory Visit: Payer: Self-pay | Admitting: Pulmonary Disease

## 2012-06-16 DIAGNOSIS — J449 Chronic obstructive pulmonary disease, unspecified: Secondary | ICD-10-CM

## 2012-06-16 NOTE — Telephone Encounter (Signed)
Patient is aware of ONO results and verbalized understanding.

## 2012-06-16 NOTE — Telephone Encounter (Signed)
Let pt know that her ONO showed desat to 78%, and spent less than 88%.  She needs to wear oxygen at night while sleeping as well.  Will send order to dme for 2 lpm while sleeping.

## 2012-06-16 NOTE — Telephone Encounter (Signed)
ONO ordered on 9.11.13 - called [facility] spoke with Pernice who reported that ONO was performed 9.19.13.  Results to be faxed to the triage fax machine to my attention.  Will place in [facility] lookat once received.

## 2012-06-16 NOTE — Telephone Encounter (Signed)
ONO received from [facility] and placed in KC's lookat.

## 2012-06-16 NOTE — Telephone Encounter (Signed)
Haven't seen these.  Do we have?

## 2012-06-22 ENCOUNTER — Encounter: Payer: Self-pay | Admitting: Pulmonary Disease

## 2012-06-24 ENCOUNTER — Ambulatory Visit
Admission: RE | Admit: 2012-06-24 | Discharge: 2012-06-24 | Disposition: A | Discharge status: Home / Self Care | Payer: Medicare Other | Source: Ambulatory Visit | Attending: Internal Medicine | Admitting: Internal Medicine

## 2012-06-24 ENCOUNTER — Other Ambulatory Visit: Payer: Self-pay | Admitting: Internal Medicine

## 2012-06-24 DIAGNOSIS — R928 Other abnormal and inconclusive findings on diagnostic imaging of breast: Secondary | ICD-10-CM | POA: Diagnosis not present

## 2012-06-24 DIAGNOSIS — Z9889 Other specified postprocedural states: Secondary | ICD-10-CM

## 2012-06-24 DIAGNOSIS — Z853 Personal history of malignant neoplasm of breast: Secondary | ICD-10-CM

## 2012-07-01 ENCOUNTER — Ambulatory Visit
Admission: RE | Admit: 2012-07-01 | Discharge: 2012-07-01 | Disposition: A | Discharge status: Home / Self Care | Payer: Medicare Other | Source: Ambulatory Visit | Attending: Internal Medicine | Admitting: Internal Medicine

## 2012-07-01 ENCOUNTER — Other Ambulatory Visit: Payer: Self-pay | Admitting: Internal Medicine

## 2012-07-01 DIAGNOSIS — Z9889 Other specified postprocedural states: Secondary | ICD-10-CM

## 2012-07-01 DIAGNOSIS — D0590 Unspecified type of carcinoma in situ of unspecified breast: Secondary | ICD-10-CM | POA: Diagnosis not present

## 2012-07-01 DIAGNOSIS — Z853 Personal history of malignant neoplasm of breast: Secondary | ICD-10-CM

## 2012-07-01 DIAGNOSIS — R928 Other abnormal and inconclusive findings on diagnostic imaging of breast: Secondary | ICD-10-CM | POA: Diagnosis not present

## 2012-07-02 ENCOUNTER — Other Ambulatory Visit: Payer: Self-pay | Admitting: Internal Medicine

## 2012-07-02 DIAGNOSIS — C50911 Malignant neoplasm of unspecified site of right female breast: Secondary | ICD-10-CM

## 2012-07-05 ENCOUNTER — Other Ambulatory Visit: Payer: Medicare Other

## 2012-07-06 ENCOUNTER — Other Ambulatory Visit: Payer: Self-pay | Admitting: Hematology & Oncology

## 2012-07-06 ENCOUNTER — Other Ambulatory Visit (INDEPENDENT_AMBULATORY_CARE_PROVIDER_SITE_OTHER): Payer: Self-pay | Admitting: General Surgery

## 2012-07-06 ENCOUNTER — Ambulatory Visit (INDEPENDENT_AMBULATORY_CARE_PROVIDER_SITE_OTHER): Payer: Medicare Other | Admitting: General Surgery

## 2012-07-06 ENCOUNTER — Encounter (INDEPENDENT_AMBULATORY_CARE_PROVIDER_SITE_OTHER): Payer: Self-pay | Admitting: General Surgery

## 2012-07-06 VITALS — BP 182/80 | HR 96 | Temp 97.7°F | Resp 28 | Ht 66.5 in | Wt 141.8 lb

## 2012-07-06 DIAGNOSIS — C50119 Malignant neoplasm of central portion of unspecified female breast: Secondary | ICD-10-CM

## 2012-07-06 NOTE — Patient Instructions (Signed)
Recent biopsy of the right breast shows noninvasive breast cancer. You had been given a copy of the pathology report today.  You'll be scheduled for right partial mastectomy with needle localization in the near future. We will try to preserve your nipple and areola  You will be referred to Dr. Arlan Organ and to one of the radiation oncologists postop.      .Lumpectomy, Breast Conserving Surgery A lumpectomy is breast surgery that removes only part of the breast. Another name used may be partial mastectomy. The amount removed varies. Make sure you understand how much of your breast will be removed. Reasons for a lumpectomy:  Any solid breast mass.  Grouped significant nodularity that may be confused with a solitary breast mass. Lumpectomy is the most common form of breast cancer surgery today. The surgeon removes the portion of your breast which contains the tumor (cancer). This is the lump. Some normal tissue around the lump is also removed to be sure that all the tumor has been removed.  If cancer cells are found in the margins where the breast tissue was removed, your surgeon will do more surgery to remove the remaining cancer tissue. This is called re-excision surgery. Radiation and/or chemotherapy treatments are often given following a lumpectomy to kill any cancer cells that could possibly remain.  REASONS YOU MAY NOT BE ABLE TO HAVE BREAST CONSERVING SURGERY:  The tumor is located in more than one place.  Your breast is small and the tumor is large so the breast would be disfigured.  The entire tumor removal is not successful with a lumpectomy.  You cannot commit to a full course of chemotherapy, radiation therapy or are pregnant and cannot have radiation.  You have previously had radiation to the breast to treat cancer. HOW A LUMPECTOMY IS PERFORMED If overnight nursing is not required following a biopsy, a lumpectomy can be performed as a same-day surgery. This can be done  in a hospital, clinic, or surgical center. The anesthesia used will depend on your surgeon. They will discuss this with you. A general anesthetic keeps you sleeping through the procedure. LET YOUR CAREGIVERS KNOW ABOUT THE FOLLOWING:  Allergies  Medications taken including herbs, eye drops, over the counter medications, and creams.  Use of steroids (by mouth or creams)  Previous problems with anesthetics or Novocaine.  Possibility of pregnancy, if this applies  History of blood clots (thrombophlebitis)  History of bleeding or blood problems.  Previous surgery  Other health problems BEFORE THE PROCEDURE You should be present one hour prior to your procedure unless directed otherwise.  AFTER THE PROCEDURE  After surgery, you will be taken to the recovery area where a nurse will watch and check your progress. Once you're awake, stable, and taking fluids well, barring other problems you will be allowed to go home.  Ice packs applied to your operative site may help with discomfort and keep the swelling down.  A small rubber drain may be placed in the breast for a couple of days to prevent a hematoma from developing in the breast.  A pressure dressing may be applied for 24 to 48 hours to prevent bleeding.  Keep the wound dry.  You may resume a normal diet and activities as directed. Avoid strenuous activities affecting the arm on the side of the biopsy site such as tennis, swimming, heavy lifting (more than 10 pounds) or pulling.  Bruising in the breast is normal following this procedure.  Wearing a bra - even  to bed - may be more comfortable and also help keep the dressing on.  Change dressings as directed.  Only take over-the-counter or prescription medicines for pain, discomfort, or fever as directed by your caregiver. Call for your results as instructed by your surgeon. Remember it is your responsibility to get the results of your lumpectomy if your surgeon asked you to  follow-up. Do not assume everything is fine if you have not heard from your caregiver. SEEK MEDICAL CARE IF:   There is increased bleeding (more than a small spot) from the wound.  You notice redness, swelling, or increasing pain in the wound.  Pus is coming from wound.  An unexplained oral temperature above 102 F (38.9 C) develops.  You notice a foul smell coming from the wound or dressing. SEEK IMMEDIATE MEDICAL CARE IF:   You develop a rash.  You have difficulty breathing.  You have any allergic problems. Document Released: 10/21/2006 Document Revised: 12/02/2011 Document Reviewed: 01/22/2007 [facility] Patient Information 2013 [place], [place].

## 2012-07-06 NOTE — Progress Notes (Signed)
Patient ID: [name], female   DOB: 1930/02/10, 78 y.o.   MRN: [id number]  Chief Complaint  Patient presents with  . Pre-op Exam    eval new RT brca    HPI [name] is a 78 y.o. female.  She is referred by Dr. Anselmo Pickler at the breast center for management of a newly diagnosed cancer of the right breast.  The patient's past history is significant for a left partial mastectomy and axillary lymph node dissection by Dr. Jerelene Redden in August of 2007. She had a 7 mm invasive duct carcinoma, negative nodes. Postop radiation therapy. Observation without further therapy.  Recent screening mammogram showed postop changes on the left but an area of worrisome microcalcifications in the central right breast retroareolar area. This area was biopsied and shows low-grade DCIS, breast diagnostic profile pending. I have reviewed these films with Dr. Mayford Knife at the breast center and there appears to be about a 2.5 cm distance between the subareolar skin and the area of calcifications. It is possible that we can preserve her nipple and areola.  She is interested in breast conserving therapy and does not want a mastectomy unless it is essential.  MRI was suggested but canceled because she has a hip prosthesis and hip pinning. She is also claustrophobic.  Medical problems include COPD, uses oxygen home, followed by Dr. Shelle Iron, last chest x-ray 2 months ago. Hip prosthesis. Hypertension. Hyperlipidemia. Anxiety and depression. Vaginal hysterectomy.  Family history reveals breast cancer in a sister and in 3 aunts. No ovarian cancer. Father had prostate cancer. HPI  Past Medical History  Diagnosis Date  . Other emphysema   . Unspecified essential hypertension   . Osteoporosis, unspecified   . Other and unspecified hyperlipidemia   . Esophageal reflux   . Unspecified asthma   . Cancer   . Arthritis   . Emphysema of lung     Past Surgical History  Procedure Date  . Partial hysterectomy     . Mastectomy   . Cataract extraction   . Hip surgery   . Breast surgery   . Abdominal hysterectomy   . Cyst on face 6 months ago  . Mass excision 02/18/2012    Procedure: MINOR EXCISION OF MASS;  Surgeon: Ernestene Mention, MD;  Location: [facility];  Service: General;  Laterality: Left;  excise 2.5 cm. mass left chest wall    Family History  Problem Relation Age of Onset  . Coronary artery disease Mother   . Diabetes Father   . Breast cancer Sister   . Kidney cancer Brother   . COPD      Social History History  Substance Use Topics  . Smoking status: Former Smoker -- 1.5 packs/day for 40 years    Types: Cigarettes    Quit date: 09/24/1995  . Smokeless tobacco: Never Used  . Alcohol Use: No    Allergies  Allergen Reactions  . Neosporin (Neomycin-Polymyxin B Gu) Itching and Rash  . Codeine Nausea Only  . Irbesartan Hives    Avapro  . Adhesive (Tape) Itching and Rash    To paper tape only. Rash and itching only where applied.    Current Outpatient Prescriptions  Medication Sig Dispense Refill  . albuterol (PROVENTIL HFA) 108 (90 BASE) MCG/ACT inhaler Inhale 2 puffs into the lungs every 6 (six) hours as needed.        Marland Kitchen albuterol (PROVENTIL) (2.5 MG/3ML) 0.083% nebulizer solution Take 2.5 mg by nebulization  every 6 (six) hours as needed.        . ALPRAZolam (XANAX) 0.5 MG tablet Take 0.5 mg by mouth as needed.       Marland Kitchen amLODipine (NORVASC) 5 MG tablet Take 5 mg by mouth daily.        Marland Kitchen aspirin 81 MG tablet Take 81 mg by mouth daily.        Marland Kitchen atorvastatin (LIPITOR) 10 MG tablet Take 10 mg by mouth daily.        Marland Kitchen b complex vitamins tablet       . calcitonin, salmon, (MIACALCIN/FORTICAL) 200 UNIT/ACT nasal spray as needed.       . Cholecalciferol (VITAMIN D) 1000 UNITS capsule Take 1,000 Units by mouth daily.        . cyclobenzaprine (FLEXERIL) 5 MG tablet 5 mg as needed.       . Fluticasone-Salmeterol (ADVAIR DISKUS) 250-50 MCG/DOSE AEPB Inhale 1 puff into  the lungs every 12 (twelve) hours.  180 each  4  . Multiple Vitamin (MULTI-VITAMIN DAILY PO) Take by mouth.      Marland Kitchen omeprazole (PRILOSEC) 20 MG capsule Take 20 mg by mouth daily.        . ramipril (ALTACE) 10 MG capsule Take 10 mg by mouth daily.        Marland Kitchen SPIRIVA HANDIHALER 18 MCG inhalation capsule PLACE 1 CAPSULE INTO       INHALER AND INHALE THE     CONTENTS OF 1 CAPSULE DAILY  90 capsule  4  . traMADol-acetaminophen (ULTRACET) 37.5-325 MG per tablet Take 1 tablet by mouth as needed.         Review of Systems Review of Systems  Constitutional: Negative for fever, chills and unexpected weight change.  HENT: Negative for hearing loss, congestion, sore throat, trouble swallowing and voice change.   Eyes: Negative for visual disturbance.  Respiratory: Positive for cough and shortness of breath. Negative for wheezing.   Cardiovascular: Negative for chest pain, palpitations and leg swelling.  Gastrointestinal: Negative for nausea, vomiting, abdominal pain, diarrhea, constipation, blood in stool, abdominal distention and anal bleeding.  Genitourinary: Negative for hematuria, vaginal bleeding and difficulty urinating.  Musculoskeletal: Negative for arthralgias.  Skin: Negative for rash and wound.  Neurological: Negative for seizures, syncope and headaches.  Hematological: Negative for adenopathy. Does not bruise/bleed easily.  Psychiatric/Behavioral: Negative for confusion.    Blood pressure 182/80, pulse 96, temperature 97.7 F (36.5 C), temperature source Temporal, resp. rate 28, height 5' 6.5" (1.689 m), weight 141 lb 12.8 oz (64.32 kg).  Physical Exam Physical Exam  Constitutional: She is oriented to person, place, and time. She appears well-developed and well-nourished. No distress.  HENT:  Head: Normocephalic and atraumatic.  Nose: Nose normal.  Mouth/Throat: No oropharyngeal exudate.  Eyes: Conjunctivae normal and EOM are normal. Pupils are equal, round, and reactive to light. Left  eye exhibits no discharge. No scleral icterus.  Neck: Neck supple. No JVD present. No tracheal deviation present. No thyromegaly present.  Cardiovascular: Normal rate, regular rhythm, normal heart sounds and intact distal pulses.   No murmur heard. Pulmonary/Chest: Effort normal and breath sounds normal. No respiratory distress. She has no wheezes. She has no rales. She exhibits no tenderness.       Small bruise right breast laterally at biopsy site. Minimal hematoma. No pathologic mass. No axillary adenopathy. Left breast reveals old scar lateral left breast and left axilla, well-healed. No mass. No adenopathy.  Distant breath sounds. No wheeze  Abdominal: Soft. Bowel sounds are normal. She exhibits no distension and no mass. There is no tenderness. There is no rebound and no guarding.  Musculoskeletal: She exhibits no edema and no tenderness.  Lymphadenopathy:    She has no cervical adenopathy.  Neurological: She is alert and oriented to person, place, and time. She exhibits normal muscle tone. Coordination normal.  Skin: Skin is warm. No rash noted. She is not diaphoretic. No erythema. No pallor.  Psychiatric: She has a normal mood and affect. Her behavior is normal. Judgment and thought content normal.    Data Reviewed Mammograms. Pathology report. Review of imaging with Dr. Mayford Knife. Our old chart.  Assessment    Low-grade DCIS right breast, subareolar area. I think that she is a candidate for breast conservation and hopefully we can preserve her nipple and areola, although this is not certain  History of stage I invasive cancer lateral  left breast, 2007, no evidence of recurrence to date  Significant COPD  Hypertension  Anxiety and depression  History vaginal hysterectomy  History of hip prosthesis  Family history breast cancer in sister and three aunts    Plan    Scheduled for a right partial mastectomy with needle localization in the near future. Hopefully we can get  adequate margin with a circumareolar incision. If not,  patient is aware that she may need reoperation and removal of the nipple and areola for margins. She accepts this.  The does not appear to be any indication for lymph node biopsy.  I discussed the indications, details, techniques, and numerous risk with the patient in detail. She understands these issues. Her questions were answered. Her sister is with her throughout encounter. They agree with this plan.  Referred to medical oncology and radiation oncology postop       [facility]. [name], M.D., [facility] General and Minimally invasive Surgery Breast and Colorectal Surgery Office:   [phone] Pager:   [phone]  07/06/2012, 3:00 PM

## 2012-07-07 ENCOUNTER — Telehealth: Payer: Self-pay | Admitting: Hematology & Oncology

## 2012-07-07 NOTE — Telephone Encounter (Signed)
Pt aware of 11-18

## 2012-07-08 ENCOUNTER — Encounter (HOSPITAL_BASED_OUTPATIENT_CLINIC_OR_DEPARTMENT_OTHER): Payer: Self-pay | Admitting: *Deleted

## 2012-07-08 NOTE — Progress Notes (Signed)
Pt had lt lump and axillary nodes here 07-active, COPD-uses o2 at night-has not needed neb tx in awhile. To come in for bmet-ekg-bring all meds and overnight bag dos-just in case she needs to stay due to resp issues.sees dr clance-no cardiac problems

## 2012-07-09 ENCOUNTER — Encounter (HOSPITAL_BASED_OUTPATIENT_CLINIC_OR_DEPARTMENT_OTHER)
Admission: RE | Admit: 2012-07-09 | Discharge: 2012-07-09 | Disposition: A | Discharge status: Home / Self Care | Payer: Medicare Other | Source: Ambulatory Visit | Attending: General Surgery | Admitting: General Surgery

## 2012-07-09 DIAGNOSIS — J449 Chronic obstructive pulmonary disease, unspecified: Secondary | ICD-10-CM | POA: Diagnosis not present

## 2012-07-09 DIAGNOSIS — Z01810 Encounter for preprocedural cardiovascular examination: Secondary | ICD-10-CM | POA: Diagnosis not present

## 2012-07-09 DIAGNOSIS — I10 Essential (primary) hypertension: Secondary | ICD-10-CM | POA: Diagnosis not present

## 2012-07-09 DIAGNOSIS — Z9981 Dependence on supplemental oxygen: Secondary | ICD-10-CM | POA: Diagnosis not present

## 2012-07-09 DIAGNOSIS — Z853 Personal history of malignant neoplasm of breast: Secondary | ICD-10-CM | POA: Diagnosis not present

## 2012-07-09 DIAGNOSIS — D0590 Unspecified type of carcinoma in situ of unspecified breast: Secondary | ICD-10-CM | POA: Diagnosis not present

## 2012-07-09 DIAGNOSIS — Z803 Family history of malignant neoplasm of breast: Secondary | ICD-10-CM | POA: Diagnosis not present

## 2012-07-09 DIAGNOSIS — Z01812 Encounter for preprocedural laboratory examination: Secondary | ICD-10-CM | POA: Diagnosis not present

## 2012-07-09 LAB — BASIC METABOLIC PANEL
CO2: 31 mEq/L (ref 19–32)
Calcium: 9.5 mg/dL (ref 8.4–10.5)
Creatinine, Ser: 0.57 mg/dL (ref 0.50–1.10)
GFR calc Af Amer: >90 mL/min (ref >90)
GFR calc non Af Amer: 85 mL/min — ABNORMAL LOW (ref >90)
Sodium: 143 mEq/L (ref 135–145)

## 2012-07-10 NOTE — H&P (Signed)
[name]     MRN: [id number]   Description: 78 year old female  Provider: Ernestene Mention, MD  Department: [facility]       Diagnoses     Cancer of central portion of female breast   - Primary    174.1           Vitals -   BP Pulse Temp Resp Ht Wt    182/80 96 97.7 F (36.5 C) (Temporal) 28 5' 6.5" (1.689 m) 141 lb 12.8 oz (64.32 kg)   BMI - 22.54 kg/m2                 History and Physical     Ernestene Mention, MD  Patient ID: [name], female   DOB: 07-31-1930, 78 y.o.   MRN: [id number]                 HPI [name] is a 78 y.o. female.  She is referred by Dr. Anselmo Pickler at the breast center for management of a newly diagnosed cancer of the right breast.   The patient's past history is significant for a left partial mastectomy and axillary lymph node dissection by Dr. Jerelene Redden in August of 2007. She had a 7 mm invasive duct carcinoma, negative nodes. Postop radiation therapy. Observation without further therapy.   Recent screening mammogram showed postop changes on the left but an area of worrisome microcalcifications in the central right breast retroareolar area. This area was biopsied and shows low-grade DCIS, breast diagnostic profile pending. I have reviewed these films with Dr. Mayford Knife at the breast center and there appears to be about a 2.5 cm distance between the subareolar skin and the area of calcifications. It is possible that we can preserve her nipple and areola.   She is interested in breast conserving therapy and does not want a mastectomy unless it is essential.   MRI was suggested but canceled because she has a hip prosthesis and hip pinning. She is also claustrophobic.   Medical problems include COPD, uses oxygen home, followed by Dr. Shelle Iron, last chest x-ray 2 months ago. Hip prosthesis. Hypertension. Hyperlipidemia. Anxiety and depression. Vaginal hysterectomy.   Family history reveals breast cancer in a  sister and in 3 aunts. No ovarian cancer. Father had prostate cancer.      Past Medical History   Diagnosis  Date   .  Other emphysema     .  Unspecified essential hypertension     .  Osteoporosis, unspecified     .  Other and unspecified hyperlipidemia     .  Esophageal reflux     .  Unspecified asthma     .  Cancer     .  Arthritis     .  Emphysema of lung         Past Surgical History   Procedure  Date   .  Partial hysterectomy     .  Mastectomy     .  Cataract extraction     .  Hip surgery     .  Breast surgery     .  Abdominal hysterectomy     .  Cyst on face  6 months ago   .  Mass excision  02/18/2012       Procedure: MINOR EXCISION OF MASS;  Surgeon: Ernestene Mention, MD;  Location: [facility];  Service: General;  Laterality: Left;  excise 2.5  cm. mass left chest wall       Family History   Problem  Relation  Age of Onset   .  Coronary artery disease  Mother     .  Diabetes  Father     .  Breast cancer  Sister     .  Kidney cancer  Brother     .  COPD          Social History History   Substance Use Topics   .  Smoking status:  Former Smoker -- 1.5 packs/day for 40 years       Types:  Cigarettes       Quit date:  09/24/1995   .  Smokeless tobacco:  Never Used   .  Alcohol Use:  No       Allergies   Allergen  Reactions   .  Neosporin (Neomycin-Polymyxin B Gu)  Itching and Rash   .  Codeine  Nausea Only   .  Irbesartan  Hives       Avapro   .  Adhesive (Tape)  Itching and Rash       To paper tape only. Rash and itching only where applied.       Current Outpatient Prescriptions   Medication  Sig  Dispense  Refill   .  albuterol (PROVENTIL HFA) 108 (90 BASE) MCG/ACT inhaler  Inhale 2 puffs into the lungs every 6 (six) hours as needed.           Marland Kitchen  albuterol (PROVENTIL) (2.5 MG/3ML) 0.083% nebulizer solution  Take 2.5 mg by nebulization every 6 (six) hours as needed.           .  ALPRAZolam (XANAX) 0.5 MG tablet  Take 0.5 mg by mouth as  needed.          Marland Kitchen  amLODipine (NORVASC) 5 MG tablet  Take 5 mg by mouth daily.           Marland Kitchen  aspirin 81 MG tablet  Take 81 mg by mouth daily.           Marland Kitchen  atorvastatin (LIPITOR) 10 MG tablet  Take 10 mg by mouth daily.           Marland Kitchen  b complex vitamins tablet           .  calcitonin, salmon, (MIACALCIN/FORTICAL) 200 UNIT/ACT nasal spray  as needed.          .  Cholecalciferol (VITAMIN D) 1000 UNITS capsule  Take 1,000 Units by mouth daily.           .  cyclobenzaprine (FLEXERIL) 5 MG tablet  5 mg as needed.          .  Fluticasone-Salmeterol (ADVAIR DISKUS) 250-50 MCG/DOSE AEPB  Inhale 1 puff into the lungs every 12 (twelve) hours.   180 each   4   .  Multiple Vitamin (MULTI-VITAMIN DAILY PO)  Take by mouth.         Marland Kitchen  omeprazole (PRILOSEC) 20 MG capsule  Take 20 mg by mouth daily.           .  ramipril (ALTACE) 10 MG capsule  Take 10 mg by mouth daily.           Marland Kitchen  SPIRIVA HANDIHALER 18 MCG inhalation capsule  PLACE 1 CAPSULE INTO       INHALER AND INHALE THE     CONTENTS OF 1 CAPSULE DAILY   90  capsule   4   .  traMADol-acetaminophen (ULTRACET) 37.5-325 MG per tablet  Take 1 tablet by mouth as needed.             Review of Systems  Constitutional: Negative for fever, chills and unexpected weight change.  HENT: Negative for hearing loss, congestion, sore throat, trouble swallowing and voice change.   Eyes: Negative for visual disturbance.  Respiratory: Positive for cough and shortness of breath. Negative for wheezing.   Cardiovascular: Negative for chest pain, palpitations and leg swelling.  Gastrointestinal: Negative for nausea, vomiting, abdominal pain, diarrhea, constipation, blood in stool, abdominal distention and anal bleeding.  Genitourinary: Negative for hematuria, vaginal bleeding and difficulty urinating.  Musculoskeletal: Negative for arthralgias.  Skin: Negative for rash and wound.  Neurological: Negative for seizures, syncope and headaches.  Hematological: Negative for  adenopathy. Does not bruise/bleed easily.  Psychiatric/Behavioral: Negative for confusion.    Blood pressure 182/80, pulse 96, temperature 97.7 F (36.5 C), temperature source Temporal, resp. rate 28, height 5' 6.5" (1.689 m), weight 141 lb 12.8 oz (64.32 kg).   Physical Exam   Constitutional: She is oriented to person, place, and time. She appears well-developed and well-nourished. No distress.  HENT:   Head: Normocephalic and atraumatic.   Nose: Nose normal.   Mouth/Throat: No oropharyngeal exudate.  Eyes: Conjunctivae normal and EOM are normal. Pupils are equal, round, and reactive to light. Left eye exhibits no discharge. No scleral icterus.  Neck: Neck supple. No JVD present. No tracheal deviation present. No thyromegaly present.  Cardiovascular: Normal rate, regular rhythm, normal heart sounds and intact distal pulses.    No murmur heard. Pulmonary/Chest: Effort normal and breath sounds normal. No respiratory distress. She has no wheezes. She has no rales. She exhibits no tenderness.       Small bruise right breast laterally at biopsy site. Minimal hematoma. No pathologic mass. No axillary adenopathy. Left breast reveals old scar lateral left breast and left axilla, well-healed. No mass. No adenopathy.  Distant breath sounds. No wheeze  Abdominal: Soft. Bowel sounds are normal. She exhibits no distension and no mass. There is no tenderness. There is no rebound and no guarding.  Musculoskeletal: She exhibits no edema and no tenderness.  Lymphadenopathy:    She has no cervical adenopathy.  Neurological: She is alert and oriented to person, place, and time. She exhibits normal muscle tone. Coordination normal.  Skin: Skin is warm. No rash noted. She is not diaphoretic. No erythema. No pallor.  Psychiatric: She has a normal mood and affect. Her behavior is normal. Judgment and thought content normal.    Data Reviewed Mammograms. Pathology report. Review of imaging with Dr. Mayford Knife.  Our old chart.   Assessment Low-grade DCIS right breast, subareolar area. I think that she is a candidate for breast conservation and hopefully we can preserve her nipple and areola, although this is not certain   History of stage I invasive cancer lateral  left breast, 2007, no evidence of recurrence to date   Significant COPD  Hypertension  Anxiety and depression  History vaginal hysterectomy  History of hip prosthesis  Family history breast cancer in sister and three aunts   Plan Scheduled for a right partial mastectomy with needle localization in the near future. Hopefully we can get adequate margin with a circumareolar incision. If not,  patient is aware that she may need reoperation and removal of the nipple and areola for margins. She accepts this.   The does not appear  to be any indication for lymph node biopsy.   I discussed the indications, details, techniques, and numerous risk with the patient in detail. She understands these issues. Her questions were answered. Her sister is with her throughout encounter. They agree with this plan.   Referred to medical oncology and radiation oncology postop       [facility]. [name], M.D., [facility] General and Minimally invasive Surgery Breast and Colorectal Surgery Office:   [phone] Pager:   [phone]

## 2012-07-13 ENCOUNTER — Encounter (HOSPITAL_BASED_OUTPATIENT_CLINIC_OR_DEPARTMENT_OTHER): Payer: Self-pay | Admitting: Certified Registered Nurse Anesthetist

## 2012-07-13 ENCOUNTER — Encounter (HOSPITAL_BASED_OUTPATIENT_CLINIC_OR_DEPARTMENT_OTHER): Payer: Self-pay

## 2012-07-13 ENCOUNTER — Other Ambulatory Visit (INDEPENDENT_AMBULATORY_CARE_PROVIDER_SITE_OTHER): Payer: Self-pay | Admitting: General Surgery

## 2012-07-13 ENCOUNTER — Ambulatory Visit (HOSPITAL_BASED_OUTPATIENT_CLINIC_OR_DEPARTMENT_OTHER)
Admission: RE | Admit: 2012-07-13 | Discharge: 2012-07-13 | Disposition: A | Discharge status: Home / Self Care | Payer: Medicare Other | Source: Ambulatory Visit | Attending: General Surgery | Admitting: General Surgery

## 2012-07-13 ENCOUNTER — Ambulatory Visit
Admission: RE | Admit: 2012-07-13 | Discharge: 2012-07-13 | Disposition: A | Discharge status: Home / Self Care | Payer: Medicare Other | Source: Ambulatory Visit | Attending: General Surgery | Admitting: General Surgery

## 2012-07-13 ENCOUNTER — Encounter (HOSPITAL_BASED_OUTPATIENT_CLINIC_OR_DEPARTMENT_OTHER)
Admission: RE | Disposition: A | Discharge status: Home / Self Care | Payer: Self-pay | Source: Ambulatory Visit | Attending: General Surgery

## 2012-07-13 ENCOUNTER — Ambulatory Visit (HOSPITAL_BASED_OUTPATIENT_CLINIC_OR_DEPARTMENT_OTHER): Payer: Medicare Other | Admitting: Certified Registered Nurse Anesthetist

## 2012-07-13 DIAGNOSIS — C50119 Malignant neoplasm of central portion of unspecified female breast: Secondary | ICD-10-CM

## 2012-07-13 DIAGNOSIS — J449 Chronic obstructive pulmonary disease, unspecified: Secondary | ICD-10-CM | POA: Diagnosis not present

## 2012-07-13 DIAGNOSIS — D0590 Unspecified type of carcinoma in situ of unspecified breast: Secondary | ICD-10-CM | POA: Diagnosis not present

## 2012-07-13 DIAGNOSIS — J4489 Other specified chronic obstructive pulmonary disease: Secondary | ICD-10-CM | POA: Insufficient documentation

## 2012-07-13 DIAGNOSIS — Z9981 Dependence on supplemental oxygen: Secondary | ICD-10-CM | POA: Insufficient documentation

## 2012-07-13 DIAGNOSIS — Z01810 Encounter for preprocedural cardiovascular examination: Secondary | ICD-10-CM | POA: Insufficient documentation

## 2012-07-13 DIAGNOSIS — C50919 Malignant neoplasm of unspecified site of unspecified female breast: Secondary | ICD-10-CM | POA: Diagnosis not present

## 2012-07-13 DIAGNOSIS — Z01812 Encounter for preprocedural laboratory examination: Secondary | ICD-10-CM | POA: Insufficient documentation

## 2012-07-13 DIAGNOSIS — Z853 Personal history of malignant neoplasm of breast: Secondary | ICD-10-CM | POA: Insufficient documentation

## 2012-07-13 DIAGNOSIS — Z803 Family history of malignant neoplasm of breast: Secondary | ICD-10-CM | POA: Insufficient documentation

## 2012-07-13 DIAGNOSIS — I10 Essential (primary) hypertension: Secondary | ICD-10-CM | POA: Insufficient documentation

## 2012-07-13 HISTORY — DX: Nausea with vomiting, unspecified: R11.2

## 2012-07-13 HISTORY — DX: Presence of spectacles and contact lenses: Z97.3

## 2012-07-13 HISTORY — DX: Complete loss of teeth, unspecified cause, unspecified class: K08.109

## 2012-07-13 HISTORY — DX: Complete loss of teeth, unspecified cause, unspecified class: Z97.2

## 2012-07-13 HISTORY — DX: Dependence on supplemental oxygen: Z99.81

## 2012-07-13 HISTORY — PX: BREAST LUMPECTOMY: SHX2

## 2012-07-13 HISTORY — DX: Other specified postprocedural states: Z98.890

## 2012-07-13 SURGERY — PARTIAL MASTECTOMY WITH NEEDLE LOCALIZATION
Anesthesia: General | Site: Breast | Laterality: Right | Wound class: Clean

## 2012-07-13 MED ORDER — BUPIVACAINE-EPINEPHRINE 0.5% -1:200000 IJ SOLN
INTRAMUSCULAR | Status: DC | PRN
  Administered 2012-07-13: 18 mL

## 2012-07-13 MED ORDER — CHLORHEXIDINE GLUCONATE 4 % EX LIQD: 1.0000 "application " | Freq: Once | CUTANEOUS | Status: DC

## 2012-07-13 MED ORDER — DEXAMETHASONE SODIUM PHOSPHATE 4 MG/ML IJ SOLN
INTRAMUSCULAR | Status: DC | PRN
  Administered 2012-07-13: 5 mg via INTRAVENOUS

## 2012-07-13 MED ORDER — HYDROCODONE-ACETAMINOPHEN 5-325 MG PO TABS: 1.0000 | ORAL_TABLET | ORAL | Status: DC | PRN

## 2012-07-13 MED ORDER — FENTANYL CITRATE 0.05 MG/ML IJ SOLN
INTRAMUSCULAR | Status: DC | PRN
  Administered 2012-07-13: 100 ug via INTRAVENOUS

## 2012-07-13 MED ORDER — PROPOFOL 10 MG/ML IV BOLUS
INTRAVENOUS | Status: DC | PRN
  Administered 2012-07-13: 150 mg via INTRAVENOUS

## 2012-07-13 MED ORDER — HEPARIN SODIUM (PORCINE) 5000 UNIT/ML IJ SOLN: 5000.0000 [IU] | Freq: Once | INTRAMUSCULAR | Status: DC

## 2012-07-13 MED ORDER — ONDANSETRON HCL 4 MG/2ML IJ SOLN
INTRAMUSCULAR | Status: DC | PRN
  Administered 2012-07-13: 4 mg via INTRAVENOUS

## 2012-07-13 MED ORDER — LIDOCAINE HCL (CARDIAC) 20 MG/ML IV SOLN
INTRAVENOUS | Status: DC | PRN
  Administered 2012-07-13: 60 mg via INTRAVENOUS

## 2012-07-13 MED ORDER — LACTATED RINGERS IV SOLN
INTRAVENOUS | Status: DC
  Administered 2012-07-13: 11:00:00 via INTRAVENOUS

## 2012-07-13 MED ORDER — EPHEDRINE SULFATE 50 MG/ML IJ SOLN
INTRAMUSCULAR | Status: DC | PRN
  Administered 2012-07-13 (×2): 10 mg via INTRAVENOUS

## 2012-07-13 MED ORDER — CEFAZOLIN SODIUM-DEXTROSE 2-3 GM-% IV SOLR
2.0000 g | INTRAVENOUS | Status: AC
  Administered 2012-07-13: 2 g via INTRAVENOUS

## 2012-07-13 SURGICAL SUPPLY — 56 items
APPLIER CLIP 9.375 MED OPEN (MISCELLANEOUS) ×2
BANDAGE ELASTIC 6 VELCRO ST LF (GAUZE/BANDAGES/DRESSINGS) IMPLANT
BENZOIN TINCTURE PRP APPL 2/3 (GAUZE/BANDAGES/DRESSINGS) IMPLANT
BINDER BREAST LRG (GAUZE/BANDAGES/DRESSINGS) ×2 IMPLANT
BLADE HEX COATED 2.75 (ELECTRODE) ×2 IMPLANT
BLADE SURG 15 STRL LF DISP TIS (BLADE) ×2 IMPLANT
BLADE SURG 15 STRL SS (BLADE) ×2
CANISTER SUCTION 1200CC (MISCELLANEOUS) ×2 IMPLANT
CHLORAPREP W/TINT 26ML (MISCELLANEOUS) ×2 IMPLANT
CLIP APPLIE 9.375 MED OPEN (MISCELLANEOUS) ×1 IMPLANT
CLOTH BEACON ORANGE TIMEOUT ST (SAFETY) ×2 IMPLANT
COVER MAYO STAND STRL (DRAPES) ×2 IMPLANT
COVER TABLE BACK 60X90 (DRAPES) ×2 IMPLANT
DECANTER SPIKE VIAL GLASS SM (MISCELLANEOUS) IMPLANT
DERMABOND ADVANCED (GAUZE/BANDAGES/DRESSINGS) ×1
DERMABOND ADVANCED .7 DNX12 (GAUZE/BANDAGES/DRESSINGS) ×1 IMPLANT
DEVICE DUBIN W/COMP PLATE 8390 (MISCELLANEOUS) ×2 IMPLANT
DRAPE LAPAROSCOPIC ABDOMINAL (DRAPES) IMPLANT
DRAPE LAPAROTOMY TRNSV 102X78 (DRAPE) IMPLANT
DRAPE PED LAPAROTOMY (DRAPES) ×2 IMPLANT
DRAPE UTILITY XL STRL (DRAPES) ×2 IMPLANT
ELECT REM PT RETURN 9FT ADLT (ELECTROSURGICAL) ×2
ELECTRODE REM PT RTRN 9FT ADLT (ELECTROSURGICAL) ×1 IMPLANT
GAUZE SPONGE 4X4 12PLY STRL LF (GAUZE/BANDAGES/DRESSINGS) IMPLANT
GAUZE SPONGE 4X4 16PLY XRAY LF (GAUZE/BANDAGES/DRESSINGS) IMPLANT
GLOVE BIOGEL PI IND STRL 7.0 (GLOVE) ×1 IMPLANT
GLOVE BIOGEL PI INDICATOR 7.0 (GLOVE) ×1
GLOVE ECLIPSE 6.5 STRL STRAW (GLOVE) ×2 IMPLANT
GLOVE EUDERMIC 7 POWDERFREE (GLOVE) ×2 IMPLANT
GOWN PREVENTION PLUS XLARGE (GOWN DISPOSABLE) ×2 IMPLANT
GOWN PREVENTION PLUS XXLARGE (GOWN DISPOSABLE) ×2 IMPLANT
KIT MARKER MARGIN INK (KITS) ×2 IMPLANT
NEEDLE HYPO 22GX1.5 SAFETY (NEEDLE) IMPLANT
NEEDLE HYPO 25X1 1.5 SAFETY (NEEDLE) ×2 IMPLANT
NS IRRIG 1000ML POUR BTL (IV SOLUTION) ×2 IMPLANT
PACK BASIN DAY SURGERY FS (CUSTOM PROCEDURE TRAY) ×2 IMPLANT
PENCIL BUTTON HOLSTER BLD 10FT (ELECTRODE) ×2 IMPLANT
SLEEVE SCD COMPRESS KNEE MED (MISCELLANEOUS) ×2 IMPLANT
SPONGE LAP 4X18 X RAY DECT (DISPOSABLE) ×2 IMPLANT
STRIP CLOSURE SKIN 1/2X4 (GAUZE/BANDAGES/DRESSINGS) IMPLANT
SUT ETHILON 4 0 PS 2 18 (SUTURE) IMPLANT
SUT MON AB 4-0 PC3 18 (SUTURE) IMPLANT
SUT SILK 2 0 SH (SUTURE) ×2 IMPLANT
SUT VIC AB 2-0 SH 27 (SUTURE)
SUT VIC AB 2-0 SH 27XBRD (SUTURE) IMPLANT
SUT VIC AB 4-0 P-3 18XBRD (SUTURE) IMPLANT
SUT VIC AB 4-0 P3 18 (SUTURE)
SUT VICRYL 3-0 CR8 SH (SUTURE) ×2 IMPLANT
SUT VICRYL 4-0 PS2 18IN ABS (SUTURE) ×2 IMPLANT
SYR BULB 3OZ (MISCELLANEOUS) IMPLANT
SYR CONTROL 10ML LL (SYRINGE) ×2 IMPLANT
TAPE HYPAFIX 4 X10 (GAUZE/BANDAGES/DRESSINGS) IMPLANT
TOWEL OR NON WOVEN STRL DISP B (DISPOSABLE) ×2 IMPLANT
TUBE CONNECTING 20X1/4 (TUBING) ×2 IMPLANT
WATER STERILE IRR 1000ML POUR (IV SOLUTION) IMPLANT
YANKAUER SUCT BULB TIP NO VENT (SUCTIONS) ×2 IMPLANT

## 2012-07-13 NOTE — Transfer of Care (Signed)
Immediate Anesthesia Transfer of Care Note  Patient: [name]  Procedure(s) Performed: Procedure(s) (LRB) with comments: PARTIAL MASTECTOMY WITH NEEDLE LOCALIZATION (Right)  Patient Location: PACU  Anesthesia Type: General  Level of Consciousness: awake, alert , oriented and patient cooperative  Airway & Oxygen Therapy: Patient Spontanous Breathing and Patient connected to face mask oxygen  Post-op Assessment: Report given to PACU RN and Post -op Vital signs reviewed and stable  Post vital signs: Reviewed and stable  Complications: No apparent anesthesia complications

## 2012-07-13 NOTE — Anesthesia Preprocedure Evaluation (Signed)
Anesthesia Evaluation  Patient identified by MRN, date of birth, ID band Patient awake    Reviewed: Allergy & Precautions, H&P , NPO status , Patient's Chart, lab work & pertinent test results  Airway Mallampati: I TM Distance: >3 FB Neck ROM: Full    Dental  (+) Upper Dentures, Lower Dentures, Edentulous Upper, Edentulous Lower and Dental Advisory Given   Pulmonary asthma , COPD (Uses O2 at home while asleep) breath sounds clear to auscultation        Cardiovascular hypertension, Pt. on medications Rhythm:Regular Rate:Normal     Neuro/Psych    GI/Hepatic GERD-  Medicated and Controlled,  Endo/Other    Renal/GU      Musculoskeletal   Abdominal   Peds  Hematology   Anesthesia Other Findings   Reproductive/Obstetrics                           Anesthesia Physical Anesthesia Plan  ASA: III  Anesthesia Plan: General   Post-op Pain Management:    Induction: Intravenous  Airway Management Planned: LMA  Additional Equipment:   Intra-op Plan:   Post-operative Plan: Extubation in OR  Informed Consent: I have reviewed the patients History and Physical, chart, labs and discussed the procedure including the risks, benefits and alternatives for the proposed anesthesia with the patient or authorized representative who has indicated his/her understanding and acceptance.   Dental advisory given  Plan Discussed with: CRNA, Anesthesiologist and Surgeon  Anesthesia Plan Comments:         Anesthesia Quick Evaluation

## 2012-07-13 NOTE — Anesthesia Postprocedure Evaluation (Signed)
  Anesthesia Post-op Note  Patient: [name]  Procedure(s) Performed: Procedure(s) (LRB) with comments: PARTIAL MASTECTOMY WITH NEEDLE LOCALIZATION (Right)  Patient Location: PACU  Anesthesia Type: General  Level of Consciousness: awake, alert  and oriented  Airway and Oxygen Therapy: Patient Spontanous Breathing and Patient connected to face mask oxygen  Post-op Pain: none  Post-op Assessment: Post-op Vital signs reviewed  Post-op Vital Signs: Reviewed  Complications: No apparent anesthesia complications

## 2012-07-13 NOTE — Op Note (Addendum)
Patient Name:           [name]   Date of Surgery:        07/13/2012  Pre op Diagnosis:      Low-grade DCIS right breast, 7:00 position, subareolar, stage Tis, Nx.  Post op Diagnosis:    same  Procedure:                 Right partial mastectomy with needle localization  Surgeon:                     [name]. [name], M.D., FACS  Assistant:                      none  Operative Indications:   [name] is a 78 y.o. female. She is referred by Dr. Anselmo Pickler at the breast center for management of a newly diagnosed cancer of the right breast.    The patient's past history is significant for a left partial mastectomy and axillary lymph node dissection by Dr. Jerelene Redden in August of 2007. She had a 7 mm invasive duct carcinoma, negative nodes. Postop radiation therapy. Observation without further therapy.  Recent screening mammogram showed postop changes on the left but an area of worrisome microcalcifications in the central right breast retroareolar area. This area was biopsied and shows low-grade DCIS, breast diagnostic profile pending. I have reviewed these films with Dr. Mayford Knife at the breast center and there appears to be about a 2.5 cm distance between the subareolar skin and the area of calcifications. It is possible that we can preserve her nipple and areola.     She is interested in breast conserving therapy and does not want a mastectomy unless it is essential.  MRI was suggested but canceled because she has a hip prosthesis and hip pinning. She is also claustrophobic.  Medical problems include COPD, uses oxygen home, followed by Dr. Shelle Iron, last chest x-ray 2 months ago. Hip prosthesis. Hypertension. Hyperlipidemia. Anxiety and depression. Vaginal hysterectomy.  Family history reveals breast cancer in a sister and in 3 aunts. No ovarian cancer. Father had prostate cancer.   Operative Findings:       The lumpectomy specimen mammogram showed the wire and a marker clip within  the specimen, and was felt to be an adequate excision by the radiologist.  Procedure in Detail:          The patient underwent wire localization at the breast center [facility] and the wire was well placed. The wire was directed from lateral to medial and was  very near the marker clip in the lower pole of the breast at about the 6 to 7:00 position. The patient taken the operating room and underwent general anesthesia with an LMA device. Intravenous antibiotics were given. The right breast was prepped and draped in a sterile fashion. Surgical time out was performed. 0.5% Marcaine with epinephrine was used as a local infiltration anesthetic. I chose to use a curvilinear incision in the lower breast, slightly more lateral than medial. Dissection was carried down around the localizing wire circumferentially. The specimen was removed and marked with sutures and with a 6 color ink kit. Specimen mammogram looked good as described above. The specimen was sent to the lab. Hemostasis was excellent and achieved with electrocautery. The wound was irrigated. The breast tissues were closed in several layers with interrupted sutures of 3-0 Vicryl and the skin was closed with a running  subcuticular suture of 4-0 Monocryl and Dermabond. Breast binder was placed and the patient taken to recovery room in good condition. EBL 10 cc. Counts correct. Complications none.     [name]. [name], M.D., FACS General and Minimally Invasive Surgery Breast and Colorectal Surgery  07/13/2012 12:29 PM

## 2012-07-13 NOTE — Anesthesia Procedure Notes (Addendum)
Performed by: Oakley Orban D   Procedure Name: LMA Insertion Date/Time: 07/13/2012 11:50 AM Performed by: Makyi Ledo D Pre-anesthesia Checklist: Patient identified, Emergency Drugs available, Suction available and Patient being monitored Patient Re-evaluated:Patient Re-evaluated prior to inductionOxygen Delivery Method: Circle System Utilized Preoxygenation: Pre-oxygenation with 100% oxygen Intubation Type: IV induction Ventilation: Mask ventilation without difficulty LMA: LMA inserted LMA Size: 4.0 Number of attempts: 1 Airway Equipment and Method: bite block Placement Confirmation: positive ETCO2 Tube secured with: Tape Dental Injury: Teeth and Oropharynx as per pre-operative assessment

## 2012-07-14 LAB — POCT HEMOGLOBIN-HEMACUE: Hemoglobin: 12.5 g/dL (ref 12.0–15.0)

## 2012-07-16 ENCOUNTER — Other Ambulatory Visit (INDEPENDENT_AMBULATORY_CARE_PROVIDER_SITE_OTHER): Payer: Self-pay | Admitting: General Surgery

## 2012-07-16 ENCOUNTER — Telehealth (INDEPENDENT_AMBULATORY_CARE_PROVIDER_SITE_OTHER): Payer: Self-pay | Admitting: General Surgery

## 2012-07-16 ENCOUNTER — Telehealth: Payer: Self-pay | Admitting: Hematology & Oncology

## 2012-07-16 DIAGNOSIS — C50119 Malignant neoplasm of central portion of unspecified female breast: Secondary | ICD-10-CM

## 2012-07-16 NOTE — Telephone Encounter (Signed)
Final pathology report of her recent right lumpectomy shows low to intermediate grade ductal carcinoma in situ. The tumor is less than 1 mm from the anterior margin. Otherwise negative. Receptors are strongly positive.  I called the patient and discussed this with her. I told her that if we were to re excise the anterior margin she might lose her nipple and areola. She is currently opposed to further surgery, considering her age of 60.  She will return to see me in the office on November 7.  In the meantime, I am going to refer her back to Dr. Arlan Organ for his opinion and  to Dr. Chipper Herb for his opinion about whole breast irradiation. It is possible that they will simply give whole breast irradiation therapy and antiestrogen therapy and that may be adequate. Certainly she is at somewhat increased risk of recurrence with a close margin, but with adjuvant therapy as above, and close clinical followup, her long-term outcome may be unaffected.   [name]. [name], M.D., [facility] General and Minimally invasive Surgery Breast and Colorectal Surgery Office:   [phone] Pager:   [phone]

## 2012-07-16 NOTE — Telephone Encounter (Signed)
Called patient and advised appointment to see Dr. Myna Hidalgo has been moved to Tuesday 07/21/12 at 10:30. Dr. [name] requested appointment to be moved from 08/10/12 due to pathology result.

## 2012-07-16 NOTE — Telephone Encounter (Signed)
Dr. Jacinto Halim office called wants pt to be seen ASAP her margins are not good. Per PA scheduled 10-29 appointment. Referring office will let pt know.

## 2012-07-21 ENCOUNTER — Other Ambulatory Visit (HOSPITAL_BASED_OUTPATIENT_CLINIC_OR_DEPARTMENT_OTHER): Payer: Medicare Other | Admitting: Lab

## 2012-07-21 ENCOUNTER — Ambulatory Visit: Payer: Medicare Other | Admitting: Hematology & Oncology

## 2012-07-21 DIAGNOSIS — M810 Age-related osteoporosis without current pathological fracture: Secondary | ICD-10-CM

## 2012-07-21 DIAGNOSIS — C50519 Malignant neoplasm of lower-outer quadrant of unspecified female breast: Secondary | ICD-10-CM | POA: Diagnosis not present

## 2012-07-21 DIAGNOSIS — C50919 Malignant neoplasm of unspecified site of unspecified female breast: Secondary | ICD-10-CM | POA: Diagnosis not present

## 2012-07-21 DIAGNOSIS — E559 Vitamin D deficiency, unspecified: Secondary | ICD-10-CM | POA: Diagnosis not present

## 2012-07-21 DIAGNOSIS — D0510 Intraductal carcinoma in situ of unspecified breast: Secondary | ICD-10-CM

## 2012-07-21 LAB — CBC WITH DIFFERENTIAL (CANCER CENTER ONLY)
BASO#: 0 10*3/uL (ref 0.0–0.2)
Eosinophils Absolute: 0.1 10*3/uL (ref 0.0–0.5)
HGB: 12.8 g/dL (ref 11.6–15.9)
LYMPH%: 18.5 % (ref 14.0–48.0)
MCH: 30.6 pg (ref 26.0–34.0)
MCV: 91 fL (ref 81–101)
MONO#: 0.6 10*3/uL (ref 0.1–0.9)
MONO%: 7.1 % (ref 0.0–13.0)
NEUT#: 6.3 10*3/uL (ref 1.5–6.5)
RBC: 4.18 10*6/uL (ref 3.70–5.32)
WBC: 8.7 10*3/uL (ref 3.9–10.0)

## 2012-07-21 NOTE — Progress Notes (Signed)
This encounter was created in error - please disregard.

## 2012-07-21 NOTE — Addendum Note (Signed)
Addended by: Arlan Organ R on: 07/21/2012 03:17 PM   Modules accepted: Orders, Level of Service

## 2012-07-21 NOTE — Progress Notes (Signed)
This office note has been dictated.

## 2012-07-22 LAB — COMPREHENSIVE METABOLIC PANEL
Albumin: 3.8 g/dL (ref 3.5–5.2)
Alkaline Phosphatase: 84 U/L (ref 39–117)
BUN: 16 mg/dL (ref 6–23)
CO2: 30 mEq/L (ref 19–32)
Glucose, Bld: 81 mg/dL (ref 70–99)
Potassium: 4 mEq/L (ref 3.5–5.3)
Sodium: 138 mEq/L (ref 135–145)
Total Protein: 6.4 g/dL (ref 6.0–8.3)

## 2012-07-22 LAB — VITAMIN D 25 HYDROXY (VIT D DEFICIENCY, FRACTURES): Vit D, 25-Hydroxy: 60 ng/mL (ref 30–89)

## 2012-07-23 ENCOUNTER — Other Ambulatory Visit: Payer: Self-pay | Admitting: *Deleted

## 2012-07-23 DIAGNOSIS — C50119 Malignant neoplasm of central portion of unspecified female breast: Secondary | ICD-10-CM

## 2012-07-23 MED ORDER — TAMOXIFEN CITRATE 20 MG PO TABS: 20.0000 mg | ORAL_TABLET | Freq: Every day | ORAL | Status: DC

## 2012-07-23 NOTE — Telephone Encounter (Signed)
Pt called with c/o increased pulse, labored breathing, and "thin headedness" immediately after taking Arimidex two days in a row. Reviewed with Dr Myna Hidalgo. To D/C Arimidex & change to Tamoxifen 20 mg qd. Pt made aware and will call if further problems. (She was provided with the most common side effects of the drug).

## 2012-07-27 ENCOUNTER — Encounter: Payer: Self-pay | Admitting: Radiation Oncology

## 2012-07-27 DIAGNOSIS — C50919 Malignant neoplasm of unspecified site of unspecified female breast: Secondary | ICD-10-CM | POA: Insufficient documentation

## 2012-07-28 ENCOUNTER — Ambulatory Visit
Admission: RE | Admit: 2012-07-28 | Discharge: 2012-07-28 | Disposition: A | Discharge status: Home / Self Care | Payer: Medicare Other | Source: Ambulatory Visit | Attending: Radiation Oncology | Admitting: Radiation Oncology

## 2012-07-28 ENCOUNTER — Encounter: Payer: Self-pay | Admitting: Radiation Oncology

## 2012-07-28 VITALS — BP 157/63 | HR 93 | Temp 97.9°F | Resp 20 | Ht 66.5 in | Wt 141.3 lb

## 2012-07-28 DIAGNOSIS — K219 Gastro-esophageal reflux disease without esophagitis: Secondary | ICD-10-CM | POA: Insufficient documentation

## 2012-07-28 DIAGNOSIS — M810 Age-related osteoporosis without current pathological fracture: Secondary | ICD-10-CM | POA: Insufficient documentation

## 2012-07-28 DIAGNOSIS — Z7982 Long term (current) use of aspirin: Secondary | ICD-10-CM | POA: Diagnosis not present

## 2012-07-28 DIAGNOSIS — D0590 Unspecified type of carcinoma in situ of unspecified breast: Secondary | ICD-10-CM | POA: Diagnosis not present

## 2012-07-28 DIAGNOSIS — C50919 Malignant neoplasm of unspecified site of unspecified female breast: Secondary | ICD-10-CM

## 2012-07-28 DIAGNOSIS — Z79899 Other long term (current) drug therapy: Secondary | ICD-10-CM | POA: Insufficient documentation

## 2012-07-28 DIAGNOSIS — J438 Other emphysema: Secondary | ICD-10-CM | POA: Diagnosis not present

## 2012-07-28 DIAGNOSIS — Z803 Family history of malignant neoplasm of breast: Secondary | ICD-10-CM | POA: Diagnosis not present

## 2012-07-28 DIAGNOSIS — J4489 Other specified chronic obstructive pulmonary disease: Secondary | ICD-10-CM | POA: Insufficient documentation

## 2012-07-28 DIAGNOSIS — C50119 Malignant neoplasm of central portion of unspecified female breast: Secondary | ICD-10-CM

## 2012-07-28 DIAGNOSIS — I10 Essential (primary) hypertension: Secondary | ICD-10-CM | POA: Diagnosis not present

## 2012-07-28 DIAGNOSIS — E785 Hyperlipidemia, unspecified: Secondary | ICD-10-CM | POA: Insufficient documentation

## 2012-07-28 DIAGNOSIS — J449 Chronic obstructive pulmonary disease, unspecified: Secondary | ICD-10-CM | POA: Diagnosis not present

## 2012-07-28 DIAGNOSIS — Z87891 Personal history of nicotine dependence: Secondary | ICD-10-CM | POA: Diagnosis not present

## 2012-07-28 HISTORY — DX: Unspecified cataract: H26.9

## 2012-07-28 HISTORY — DX: Allergy, unspecified, initial encounter: T78.40XA

## 2012-07-28 HISTORY — DX: Major depressive disorder, single episode, unspecified: F32.9

## 2012-07-28 HISTORY — DX: Personal history of irradiation: Z92.3

## 2012-07-28 HISTORY — DX: Malignant neoplasm of unspecified site of unspecified female breast: C50.919

## 2012-07-28 HISTORY — DX: Anxiety disorder, unspecified: F41.9

## 2012-07-28 HISTORY — DX: Depression, unspecified: F32.A

## 2012-07-28 HISTORY — DX: Emphysema, unspecified: J43.9

## 2012-07-28 NOTE — Progress Notes (Signed)
New Consult new Primary t=Right lumpectomy 7 o'clock position low grade DCIS ER/PR=+ DX 07/01/12 Hx Left breast cancer bx 04/11/06,Invasive ductal carcinoma  05/14/06 left lumpectomy Dr.Leone Radiation therapy left breast =07/01/06-08/13/06 Dr.Wu    Allergies: Prednisone,Avapro,Codeine

## 2012-07-28 NOTE — Progress Notes (Signed)
Alert,oriented x3, right breast nipple inverted,incision well healed,glue still on incision,  Shortness breath on exertion, room air sats was 85%, purse lip breathing  Within 1 minute up to 95% room air, tenderness right breast  upon touching or when  showering still.

## 2012-07-28 NOTE — Progress Notes (Addendum)
[facility] Radiation Oncology NEW PATIENT EVALUATION  Name: [name] MRN: [id number]  Date:   07/28/2012           DOB: [birth year]  Status: outpatient   CC: Erika Housekeeper, MD  Ernestene Mention, MD , Dr. 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 female who is seen today for the courtesy Dr. [name] and Dr. Myna Hidalgo for evaluation of her stage 0 (Tis N0 M0) low to  immediate grade DCIS of the right breast. She underwent a left breast lumpectomy for a triple negative T1 B. N0 invasive ductal carcinoma of the left breast followed by radiation therapy under the direction of Dr. Dorna Bloom, completing this on 08/13/2006. A followup mammogram on 06/24/2012 showed suspicious microcalcifications in the central aspect of the right breast. A needle core biopsy was performed 07/01/2012, diagnostic for DCIS with calcifications. She was seen by Dr. [name] who performed a right partial mastectomy on 07/13/2012 with pathology revealing a 3.2 cm low to intermediate grade DCIS with the anterior margin being less than 0.1 cm. The tumor was strongly ER/PR positive at 100%. She appears to be doing well postoperatively although she does have right breast discomfort, perhaps a small hematoma. She see Dr. [name] later this week. She was seen by Dr. Arlan Organ who started her on adjuvant tamoxifen. She history of COPD, and she feels that her respiratory status has worsened slightly since her surgery, and she would not be willing to undergo further surgery .  PREVIOUS RADIATION THERAPY: Status post 4500 cGy in 25 sessions to the left breast followed by left breast boost of 1600 cGy in 8 sessions completing therapy on 08/13/2006.   PAST MEDICAL HISTORY:  has a past medical history of Other emphysema; Unspecified essential hypertension; Osteoporosis,  unspecified; Other and unspecified hyperlipidemia; Esophageal reflux; Unspecified asthma; Cancer; Arthritis; Emphysema of lung; PONV (postoperative nausea and vomiting); On home oxygen therapy; Full dentures; Wears glasses; History of radiation therapy (07/01/06-08/13/06); Breast cancer (04/11/06 bx); Breast cancer (07/01/12 bx); Anxiety; Depression; Emphysema; Cataract; and Allergy.     PAST SURGICAL HISTORY:  Past Surgical History  Procedure Date  . Partial hysterectomy   . Cataract extraction   . Hip surgery 1993    left  . Breast surgery   . Cyst on face 6 months ago  . Mass excision 02/18/2012    Procedure: MINOR EXCISION OF MASS;  Surgeon: Ernestene Mention, MD;  Location: [facility];  Service: General;  Laterality: Left;  excise 2.5 cm. mass left chest wall  . Mastectomy 05/14/06    lt lumpectomy-node dissDr.Leone  . Breast lumpectomy 07/13/12    right -DCIS 7 o'clock stage Tis,Nx  . Abdominal hysterectomy     partial     FAMILY HISTORY: family history includes Breast cancer in her sister; COPD in an unspecified family member; Cancer in her father and sister; Coronary artery disease in her mother; Diabetes in her father; and Kidney cancer in her brother.   SOCIAL HISTORY:  reports that she quit smoking about 16 years ago. Her smoking use included Cigarettes. She has a 60 pack-year smoking history. She has never used smokeless tobacco. She reports that she does not drink alcohol or use illicit drugs. Widowed, lives independently. 45 pack history of smoking history, stopped  16 years ago.   ALLERGIES: Neosporin; Codeine; Irbesartan; and Adhesive   MEDICATIONS:  Current Outpatient Prescriptions  Medication Sig Dispense Refill  . albuterol (PROVENTIL HFA) 108 (90 BASE) MCG/ACT inhaler Inhale 2 puffs into the lungs every 6 (six) hours as needed.        Marland Kitchen albuterol (PROVENTIL) (2.5 MG/3ML) 0.083% nebulizer solution Take 2.5 mg by nebulization every 6 (six) hours as  needed.        . ALPRAZolam (XANAX) 0.5 MG tablet Take 0.5 mg by mouth as needed.       Marland Kitchen amLODipine (NORVASC) 5 MG tablet Take 5 mg by mouth daily.        Marland Kitchen aspirin 81 MG tablet Take 81 mg by mouth daily.        Marland Kitchen atorvastatin (LIPITOR) 10 MG tablet Take 10 mg by mouth daily.        . Cholecalciferol (VITAMIN D) 1000 UNITS capsule Take 1,000 Units by mouth daily. 2000 units daily staes patient 07/28/12      . Fluticasone-Salmeterol (ADVAIR DISKUS) 250-50 MCG/DOSE AEPB Inhale 1 puff into the lungs every 12 (twelve) hours.  180 each  4  . HYDROcodone-acetaminophen (NORCO/VICODIN) 5-325 MG per tablet Take 1-2 tablets by mouth every 4 (four) hours as needed for pain.  30 tablet  1  . Multiple Vitamin (MULTI-VITAMIN DAILY PO) Take by mouth.      Marland Kitchen omeprazole (PRILOSEC) 20 MG capsule Take 20 mg by mouth daily.        . ramipril (ALTACE) 10 MG capsule Take 10 mg by mouth daily.        Marland Kitchen SPIRIVA HANDIHALER 18 MCG inhalation capsule PLACE 1 CAPSULE INTO       INHALER AND INHALE THE     CONTENTS OF 1 CAPSULE DAILY  90 capsule  4  . tamoxifen (NOLVADEX) 20 MG tablet Take 1 tablet (20 mg total) by mouth daily.  30 tablet  5     REVIEW OF SYSTEMS:  Pertinent items are noted in HPI.    PHYSICAL EXAM:  height is 5' 6.5" (1.689 m) and weight is 141 lb 4.8 oz (64.093 kg). Her oral temperature is 97.9 F (36.6 C). Her blood pressure is 157/63 and her pulse is 93. Her respiration is 20 and oxygen saturation is 95%.   Alert and oriented 78 year-old white female appearing her stated age . Head and neck examination: Grossly unremarkable. Nodes: Without palpable cervical, supraclavicular, or axillary lymphadenopathy. Chest: Breath sounds distant. Back: Without spinal discomfort. Heart: Regular in rhythm. Breasts: There is a scar along the lower outer quadrant of the left breast with excellent cosmesis. No masses are appreciated. Inspection of the right breast there is a partial mastectomy wound along the lower outer  quadrant of the right breast extending from approximately 6 to 8:00. There is architectural distortion of the right nipple which is slightly inverted. There is surrounding induration, suggestive of a hematoma. The breast is tender to palpation. Abdomen: Without masses organomegaly. Extremities: Without edema. Neurologic examination: Grossly nonfocal.   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 Based on the NCCN guidelines, a margin of less than 1 mm is felt to be unacceptable leading to an increased risk for local recurrence but probably having no impact on overall survival. My recommendation is for reexcision of her anterior margin, and holding off on radiation therapy, and perhaps tamoxifen as well. She tells me that under no circumstances isshe going to undergo further surgery, and therefore I would recommend short course/hypo-fractionated radiation therapy over 4 weeks to decrease the risk for a local recurrence provided that there are not extensive residual calcifications seen on followup mammography. She would like to consider this option and try to avoid surgery. Of note is that her current Judith Part score is 6 of 9 (initial scoring index), 2 for size, 1 for grade, and 3 for surgical margin. With a Judith Part score of 6, she should receive radiation therapy to decrease the risk for local failure. She is obviously 78 years of age, but appears to be clinically stable, and may very well have a 5-10 year life expectancy. Radiation therapy would  decrease her risk for local failure, and the need for surgery in the future. I would like to repeat her mammogram later this month when her breast is less symptomatic. If she receives radiation therapy, then there may be little to any benefit for adjuvant tamoxifen except as a chemopreventive agent. We discussed the potential acute and late toxicities of radiation therapy, and consent is signed today. I left a voicemail with Dr. [name] to see if he's in agreement with her decision not to pursue further surgery. [facility] this month to be followed by CT simulation. Consent is signed today.   PLAN: As discussed above. A moderately later this month to be followed by simulation/treatment planning.   I spent 60 minutes minutes face to face with the patient and more than 50% of that time was spent in counseling and/or coordination of care.

## 2012-07-28 NOTE — Progress Notes (Signed)
Please see the Nurse Progress Note in the MD Initial Consult Encounter for this patient. 

## 2012-07-28 NOTE — Addendum Note (Signed)
Encounter addended by: Maryln Gottron, MD on: 07/28/2012  4:50 PM<BR>     Documentation filed: Flowsheet VN, Notes Section, Orders

## 2012-07-29 ENCOUNTER — Telehealth: Payer: Self-pay | Admitting: *Deleted

## 2012-07-29 ENCOUNTER — Telehealth: Payer: Self-pay | Admitting: Pulmonary Disease

## 2012-07-29 NOTE — Telephone Encounter (Signed)
CALLED PATIENT TO ASK QUESTION, LVM FOR A RETURN CALL 

## 2012-07-29 NOTE — Telephone Encounter (Signed)
Called, spoke with pt.  Informed her of below per Dr. Shelle Iron.  She verbalized understanding.  Offered OV for tomorrow but pt states she doesn't want to see anyone other than Dr. Shelle Iron.  Dr. Shelle Iron, pls advise if pt can be worked in with you.  Your schedule is booked this week.  Thank you.

## 2012-07-29 NOTE — Telephone Encounter (Signed)
Since this worsened after her cancer surgery, I would be concerned about the possibility of blood clots in the lungs from her legs.  Her breast cancer puts her at risk for this, as does immobilization after surgery.  I think she really needs to be seen and evaluated since this has been a sudden worsening and effects her just walking thru her house.

## 2012-07-29 NOTE — Telephone Encounter (Signed)
Called and spoke with pt. She states that since had Breast surgery recently her DOE is worse. She states also having increased mucus production, clear to white. She states that she gets OOB with walking from room to room- has not been wearing o2, I advised she needs to start using with all exertion per last AVS. She verbalized understanding. Declined the appt I offered her and states that she just has too much going on to come in. [facility]ants rec[facility] from [facility]. She has had no f/c/s, CP/chest tightness, wheeze.Marland KitchenPlease advise, thank! Allergies  Allergen Reactions  . Neosporin (Neomycin-Polymyxin B Gu) Itching and Rash  . Codeine Nausea Only  . Irbesartan Hives    Avapro  . Adhesive (Tape) Itching and Rash    To paper tape only. Rash and itching only where applied.

## 2012-07-29 NOTE — Telephone Encounter (Signed)
Per KC, can double book pt for tomorrow am at 9am.  Pt to arrive at 8:45 am.  Called, spoke with pt.  Informed her of this.  She verbalized understanding and will come in tomorrow morning to see [facility] and will seek emergency care if symptoms worsen prior to this.

## 2012-07-30 ENCOUNTER — Ambulatory Visit (INDEPENDENT_AMBULATORY_CARE_PROVIDER_SITE_OTHER): Payer: Medicare Other | Admitting: General Surgery

## 2012-07-30 ENCOUNTER — Ambulatory Visit (INDEPENDENT_AMBULATORY_CARE_PROVIDER_SITE_OTHER): Payer: Medicare Other | Admitting: Pulmonary Disease

## 2012-07-30 ENCOUNTER — Encounter (INDEPENDENT_AMBULATORY_CARE_PROVIDER_SITE_OTHER): Payer: Self-pay | Admitting: General Surgery

## 2012-07-30 ENCOUNTER — Encounter: Payer: Self-pay | Admitting: Pulmonary Disease

## 2012-07-30 VITALS — BP 142/80 | HR 84 | Temp 97.7°F | Resp 20 | Ht 66.5 in | Wt 139.4 lb

## 2012-07-30 VITALS — BP 128/68 | HR 84 | Temp 98.1°F | Ht 66.5 in | Wt 141.0 lb

## 2012-07-30 DIAGNOSIS — J449 Chronic obstructive pulmonary disease, unspecified: Secondary | ICD-10-CM | POA: Diagnosis not present

## 2012-07-30 DIAGNOSIS — J4489 Other specified chronic obstructive pulmonary disease: Secondary | ICD-10-CM

## 2012-07-30 DIAGNOSIS — C50119 Malignant neoplasm of central portion of unspecified female breast: Secondary | ICD-10-CM

## 2012-07-30 MED ORDER — PREDNISONE (PAK) 10 MG PO TABS: ORAL_TABLET | ORAL | Status: DC

## 2012-07-30 NOTE — Patient Instructions (Addendum)
Will treat you with a short course of prednisone to see if things improve. If you are not significantly better in one week, you need to call us. Watch for swelling of your legs/feet, and report if any sharp chest pain.

## 2012-07-30 NOTE — Patient Instructions (Signed)
Your right breast lumpectomy incision is healing without any obvious complications.  Doctor Myna Hidalgo and Dr. Dayton Scrape will decide on your final treatment plan in terms of radiation therapy versus antiestrogen pills.  Return to see Dr. [name] in 5 months, sooner if there are any problems.

## 2012-07-30 NOTE — Progress Notes (Signed)
Patient ID: [name], female   DOB: September 22, 1930, 78 y.o.   MRN: [id number] History: This patient underwent right partial mastectomy with needle localization on October 21. Pathology report shows low to intermediate grade ductal carcinoma in situ. The tumor is less than 1 mm from the anterior margin, which is not surprising since it was relatively close to the subareolar area anyway. She has seen Dr. Dayton Scrape. She has declined any further surgery because of her pulmonary status. Dr. Dayton Scrape thinks that he can treat her with whole breast radiation with boost. She  Has already started antiestrogen therapy with Dr. Myna Hidalgo. Dr. Dayton Scrape and Dr.Ennever are to discuss the best treatment plan for her. She has no significant complaints about her incision. It drained a little bit but now has stopped.  Exam: Patient is well. In no distress. Family and friends are with her. Right breast incision well healed, no hematoma, no seroma, no infection. Nipple looks okay.  Assessment low to intermediate grade DCIS right breast, lower outer quadrant subareolar area, receptor positive,status post lumpectomy with very close anterior margin. She would require removal of the nipple and areolar complex to get an adequate margin. Oxygen-dependent COPD. She has declined further surgery History left partial mastectomy axillary lymph node dissection 2007 for invasive carcinoma, stage TI B., N0. No evidence of recurrence in the left breast.  Plan: Postop mammogram scheduled for November 25, requested by Dr. Dayton Scrape to assess any residual calcifications. Dr. Dayton Scrape plans adjuvant whole breast radiation and will begin simulations soon. Dr. Dayton Scrape and Dr. Myna Hidalgo to discuss efficacy of antiestrogen therapy Return to see me in 5 months.    [name]. [name], M.D., [facility] General and Minimally invasive Surgery Breast and Colorectal Surgery Office:   [phone] Pager:   [phone]

## 2012-07-30 NOTE — Progress Notes (Signed)
  Subjective:    Patient ID: [name], female    DOB: 08/06/1930, 78 y.o.   MRN: [id number]  HPI The pt comes in today for an acute sick visit.  She has known severe copd, and has been doing well.  She has had recent breast cancer surgery 2 weeks ago, and immediately after the procedure felt she had increased dyspnea on exertion above her usual baseline.  She denies any chest congestion, increased cough, or mucus.  She has had no pleuritic chest pain, lower extremity edema or discomfort, and no calf tenderness.  She has had no fevers, chills, or sweats.  Her oxygen level is at baseline today at rest.  She does feel some chest tightness, and increased restriction airflow.   Review of Systems  Constitutional: Negative for fever and unexpected weight change.  HENT: Positive for congestion. Negative for ear pain, nosebleeds, sore throat, rhinorrhea, sneezing, trouble swallowing, dental problem, postnasal drip and sinus pressure.   Eyes: Negative for redness and itching.  Respiratory: Positive for cough, chest tightness, shortness of breath and wheezing.   Cardiovascular: Negative for palpitations and leg swelling.  Gastrointestinal: Negative for nausea and vomiting.  Genitourinary: Negative for dysuria and hematuria.  Musculoskeletal: Negative for joint swelling.  Skin: Negative for rash.  Neurological: Negative for headaches.  Hematological: Bruises/bleeds easily.  Psychiatric/Behavioral: Positive for dysphoric mood. The patient is nervous/anxious.        Objective:   Physical Exam Well-developed female in no acute distress Nose without purulence or discharge noted Oropharynx clear Neck without lymphadenopathy or thyromegaly no JVD noted Chest with decreased breath sounds, but adequate air flow.  No wheezing or rhonchi. Cardiac exam with regular rate and rhythm Lower extremities without edema, no cyanosis.  No calf tenderness noted Alert and oriented, moves all 4  extremities.       Assessment & Plan:

## 2012-07-30 NOTE — Assessment & Plan Note (Signed)
The pt is having worsening doe since her breast cancer surgery.  She reports no swelling, calf pain, and no pleuritic chest pain.  Her sats are at baseline.  Her lungs show no active wheezing.  This may simply be a reserve problem after her recent surgery, and she will return to baseline over time.  However, may also be a mild copd exacerbation.  Will treat her with a short course of prednisone to see if things improve.  If not, would consider ct chest or v/q to r/o PE.

## 2012-07-31 NOTE — Telephone Encounter (Signed)
Duplicate message. 

## 2012-08-05 NOTE — Progress Notes (Signed)
CC:   Quita Skye. [name], M.D. [name], MD,FCCP [name]. [name], M.D.  DIAGNOSES: 1. Newly diagnosed ductal carcinoma in situ (DCIS) of the right     breast. 2. 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 3. Severe chronic obstructive pulmonary disease (COPD).  CURRENT THERAPY:  Observation.  INTERIM HISTORY:  Ms. Viles comes in for an early visit.  We typically see her once a year.  She had triple-negative stage I breast cancer back in 2007.  She did not have any adjuvant therapy for this as the tumor was quite small.  She, unfortunately, now has a new problem.  She was found have a ductal carcinoma of the right breast.  She underwent a routine mammogram. This, unfortunately, showed some suspicious calcifications in the right breast.  The mammogram showed the  calcifications in the central aspect of the right breast.  She underwent a biopsy.  This was done on 07/01/2012.  The path report ([id number]) showed ductal carcinoma in situ.  The tumor was strongly ER positive and PR positive.  She then was seen by Dr. [name].  He went ahead and did a lumpectomy. This is done on 07/13/2012.  The pathology report ([id number]) showed a low- to intermediate-grade ductal carcinoma in situ.  There was no necrosis.  This tumor measured 3.2 cm.  All margins were negative, although 1 margin was about 1 mm.  She came back to see [place] for this.  She does not want to have any further surgery.  She just has bad lungs. She does not want to take the chance of having a complication from anesthesia with her lungs.  Thankfully, her COPD has not "flared up" on her for a while.  She, herself, did not notice any lumps or any changes in the right breast.  There was no swelling of the right arm.  She had no cough or shortness of breath.  She had no nausea or vomiting.  There was no bony pain.  PHYSICAL EXAMINATION:  General:  This is a somewhat elderly-appearing, thin  white female, in no obvious distress.  Vital signs:  Temperature of 98.6, pulse 80, respiratory rate 20, blood pressure 152/57.  Weight is 139.  Head and neck:  Normocephalic, atraumatic skull.  There are no ocular or oral lesions.  There are no palpable cervical or supraclavicular lymph nodes.  Lungs:  With good breath sounds bilaterally.  She has good air movement bilaterally.  Cardiac:  Regular rate and rhythm, with a normal S1 and S2.  There are no murmurs, rubs, or bruits.  Abdomen:  Soft, with good bowel sounds.  There is no palpable abdominal mass.  There is no palpable hepatosplenomegaly. Breast:  Shows her left breast with the well-healed lumpectomy at the 4 o'clock position.  There is some contraction of the left breast.  There is some firmness at the lumpectomy site.  No obvious masses noted in the left breast.  There is no left axillary adenopathy.  The right breast shows the lumpectomy site which is healing.  There is a good amount of ecchymosis and some mild swelling of the right breast.  There is no right axillary adenopathy.  Extremities:  Show no clubbing, cyanosis or edema.  She has some scattered osteoarthritic changes.  Neurological: Shows no focal neurological deficits.  LABORATORY STUDIES:  White cell count is 8.7, hemoglobin 12.8, hematocrit 38.2, platelet count 229,000.  Vitamin D is 60.  Metabolic panel is normal.  IMPRESSION:  Ms. Humbarger is a nice 78 year old white female with a history of a stage I ductal carcinoma of the left breast.  This was diagnosed back in 2007.  She now has a new ductal carcinoma in situ of the right breast. Apparently she had a lumpectomy.  I realize that according to the NCCN guidelines, that the standard margins are 1 cm.  However, Ms. Sundell does not want to have any further surgery.  I figure that given the low-grade nature of her DCIS, she could probably get away with a close margin as long as she has radiation.  She  sees Dr. Ballard Russell of Radiation Oncology next week.  Her Judith Part score is 6.  This is somewhat favorable.  However, radiation therapy does show to be effective in decreasing local recurrence.  Given her age of 25, I want additionally to make a case of just following her, particularly given her lung disease.  I will go ahead and get her on Femara.  I think this is reasonable as a "chemopreventative" or actual therapy for the DCIS.  I would think that she will tolerate this well.  If not, then we could consider tamoxifen.  I realized there may be some controversy as to put her on some systemic therapy.  However, I think that there could be a benefit.  As usual, she comes in with her sisters.  I answered all her questions. I spent over an hour with her.  I do want to see her back in about 6 weeks.  She understands that if she has repeat surgery, she will not need any radiation or likely systemic therapy.  She just does not want to go through any more surgery, and she will take the extra "burden" of radiation treatments.    ______________________________ Josph Macho, M.D. PRE/MEDQ  D:  07/29/2012  T:  07/30/2012  Job:  [id number]

## 2012-08-10 ENCOUNTER — Other Ambulatory Visit: Payer: Medicare Other | Admitting: Lab

## 2012-08-10 ENCOUNTER — Ambulatory Visit: Payer: Medicare Other | Admitting: Hematology & Oncology

## 2012-08-10 NOTE — Addendum Note (Signed)
Encounter addended by: Lowella Petties, RN on: 08/10/2012  1:46 PM<BR>     Documentation filed: Charges VN

## 2012-08-12 ENCOUNTER — Telehealth (INDEPENDENT_AMBULATORY_CARE_PROVIDER_SITE_OTHER): Payer: Self-pay | Admitting: General Surgery

## 2012-08-12 NOTE — Telephone Encounter (Signed)
Called and left message for patient to return call based on the message she left for me on Monday.  Patient had question about testing that she has scheduled. However, the only test I see is a mammogram digital diagnostic unilateral right ordered by Dr. Dayton Scrape.

## 2012-08-17 ENCOUNTER — Ambulatory Visit
Admission: RE | Admit: 2012-08-17 | Discharge: 2012-08-17 | Disposition: A | Discharge status: Home / Self Care | Payer: Medicare Other | Source: Ambulatory Visit | Attending: Radiation Oncology | Admitting: Radiation Oncology

## 2012-08-17 DIAGNOSIS — N6489 Other specified disorders of breast: Secondary | ICD-10-CM | POA: Diagnosis not present

## 2012-08-17 DIAGNOSIS — C50919 Malignant neoplasm of unspecified site of unspecified female breast: Secondary | ICD-10-CM

## 2012-08-18 ENCOUNTER — Ambulatory Visit
Admission: RE | Admit: 2012-08-18 | Discharge: 2012-08-18 | Disposition: A | Discharge status: Home / Self Care | Payer: Medicare Other | Source: Ambulatory Visit | Attending: Radiation Oncology | Admitting: Radiation Oncology

## 2012-08-18 ENCOUNTER — Telehealth: Payer: Self-pay | Admitting: Radiation Oncology

## 2012-08-18 DIAGNOSIS — L988 Other specified disorders of the skin and subcutaneous tissue: Secondary | ICD-10-CM | POA: Insufficient documentation

## 2012-08-18 DIAGNOSIS — J449 Chronic obstructive pulmonary disease, unspecified: Secondary | ICD-10-CM | POA: Diagnosis not present

## 2012-08-18 DIAGNOSIS — Z510 Encounter for antineoplastic radiation therapy: Secondary | ICD-10-CM | POA: Diagnosis not present

## 2012-08-18 DIAGNOSIS — R0602 Shortness of breath: Secondary | ICD-10-CM | POA: Insufficient documentation

## 2012-08-18 DIAGNOSIS — J069 Acute upper respiratory infection, unspecified: Secondary | ICD-10-CM | POA: Diagnosis not present

## 2012-08-18 DIAGNOSIS — C50919 Malignant neoplasm of unspecified site of unspecified female breast: Secondary | ICD-10-CM | POA: Diagnosis not present

## 2012-08-18 DIAGNOSIS — C50119 Malignant neoplasm of central portion of unspecified female breast: Secondary | ICD-10-CM | POA: Diagnosis not present

## 2012-08-18 DIAGNOSIS — J4489 Other specified chronic obstructive pulmonary disease: Secondary | ICD-10-CM | POA: Insufficient documentation

## 2012-08-18 NOTE — Telephone Encounter (Signed)
Met with patient to discuss RO billing. Pt had financial and transportation concerns and was given and EPP and SCAT application to complete and bring back.  Dx: 174.2 Breast  Attending Rad: RM  Rad Tx: Daily

## 2012-08-18 NOTE — Progress Notes (Signed)
Simulation/treatment planning note: Ms. Randle underwent simulation/treatment planning today in the management of her carcinoma of the right breast. She was placed on a breast board with a custom neck mold for immobilization. I marked her field borders in her partial mastectomy wound with a radiopaque wire. She was then scanned. She was set up to medial lateral right breast tangents. 2 separate multileaf, was were designed to conform the field. I prescribing 4250 cGy in 17 sessions utilizing 6 MV photons. Her tumor bed will be boosted for a further 750 cGy in 3 sessions. An isodose plan and dosimetry are requested.

## 2012-08-19 DIAGNOSIS — J069 Acute upper respiratory infection, unspecified: Secondary | ICD-10-CM | POA: Diagnosis not present

## 2012-08-19 DIAGNOSIS — C50119 Malignant neoplasm of central portion of unspecified female breast: Secondary | ICD-10-CM | POA: Diagnosis not present

## 2012-08-19 DIAGNOSIS — R0602 Shortness of breath: Secondary | ICD-10-CM | POA: Diagnosis not present

## 2012-08-19 DIAGNOSIS — J449 Chronic obstructive pulmonary disease, unspecified: Secondary | ICD-10-CM | POA: Diagnosis not present

## 2012-08-19 DIAGNOSIS — C50919 Malignant neoplasm of unspecified site of unspecified female breast: Secondary | ICD-10-CM | POA: Diagnosis not present

## 2012-08-19 DIAGNOSIS — Z510 Encounter for antineoplastic radiation therapy: Secondary | ICD-10-CM | POA: Diagnosis not present

## 2012-08-19 DIAGNOSIS — L988 Other specified disorders of the skin and subcutaneous tissue: Secondary | ICD-10-CM | POA: Diagnosis not present

## 2012-08-27 ENCOUNTER — Ambulatory Visit
Admission: RE | Admit: 2012-08-27 | Discharge: 2012-08-27 | Disposition: A | Discharge status: Home / Self Care | Payer: Medicare Other | Source: Ambulatory Visit | Attending: Radiation Oncology | Admitting: Radiation Oncology

## 2012-08-27 DIAGNOSIS — J069 Acute upper respiratory infection, unspecified: Secondary | ICD-10-CM | POA: Diagnosis not present

## 2012-08-27 DIAGNOSIS — C50919 Malignant neoplasm of unspecified site of unspecified female breast: Secondary | ICD-10-CM

## 2012-08-27 DIAGNOSIS — Z510 Encounter for antineoplastic radiation therapy: Secondary | ICD-10-CM | POA: Diagnosis not present

## 2012-08-27 DIAGNOSIS — R0602 Shortness of breath: Secondary | ICD-10-CM | POA: Diagnosis not present

## 2012-08-27 DIAGNOSIS — L988 Other specified disorders of the skin and subcutaneous tissue: Secondary | ICD-10-CM | POA: Diagnosis not present

## 2012-08-27 DIAGNOSIS — J449 Chronic obstructive pulmonary disease, unspecified: Secondary | ICD-10-CM | POA: Diagnosis not present

## 2012-08-27 DIAGNOSIS — C50119 Malignant neoplasm of central portion of unspecified female breast: Secondary | ICD-10-CM | POA: Diagnosis not present

## 2012-08-27 NOTE — Progress Notes (Signed)
Simulation Verification Note: The patient underwent simulation verification today for her R breast tangent XRT. Her isocenter is in good position and the MLCs contour the intended treatment volume appropriately.

## 2012-08-31 ENCOUNTER — Ambulatory Visit
Admission: RE | Admit: 2012-08-31 | Discharge: 2012-08-31 | Disposition: A | Discharge status: Home / Self Care | Payer: Medicare Other | Source: Ambulatory Visit | Attending: Radiation Oncology | Admitting: Radiation Oncology

## 2012-08-31 ENCOUNTER — Encounter: Payer: Self-pay | Admitting: Radiation Oncology

## 2012-08-31 VITALS — BP 150/77 | HR 96 | Temp 98.0°F | Resp 20 | Wt 141.0 lb

## 2012-08-31 DIAGNOSIS — C50919 Malignant neoplasm of unspecified site of unspecified female breast: Secondary | ICD-10-CM

## 2012-08-31 DIAGNOSIS — C50119 Malignant neoplasm of central portion of unspecified female breast: Secondary | ICD-10-CM

## 2012-08-31 DIAGNOSIS — L988 Other specified disorders of the skin and subcutaneous tissue: Secondary | ICD-10-CM | POA: Diagnosis not present

## 2012-08-31 DIAGNOSIS — Z510 Encounter for antineoplastic radiation therapy: Secondary | ICD-10-CM | POA: Diagnosis not present

## 2012-08-31 DIAGNOSIS — J449 Chronic obstructive pulmonary disease, unspecified: Secondary | ICD-10-CM | POA: Diagnosis not present

## 2012-08-31 DIAGNOSIS — J069 Acute upper respiratory infection, unspecified: Secondary | ICD-10-CM | POA: Diagnosis not present

## 2012-08-31 DIAGNOSIS — R0602 Shortness of breath: Secondary | ICD-10-CM | POA: Diagnosis not present

## 2012-08-31 MED ORDER — RADIAPLEXRX EX GEL
Freq: Once | CUTANEOUS | Status: AC
  Administered 2012-08-31: 09:00:00 via TOPICAL

## 2012-08-31 MED ORDER — ALRA NON-METALLIC DEODORANT (RAD-ONC)
1.0000 "application " | Freq: Once | TOPICAL | Status: AC
  Administered 2012-08-31: 1 via TOPICAL

## 2012-08-31 NOTE — Progress Notes (Signed)
Weekly Management Note:  Site: Right breast Current Dose:  250  cGy Projected Dose: 4250  CGy followed by right breast boost for a further 750 cGy in 3 sessions  Narrative: The patient is seen today for routine under treatment assessment. CBCT/MVCT images/port films were reviewed. The chart was reviewed.   No complaints today. She has Radioplex gel to use when necessary.  Physical Examination: There were no vitals filed for this visit..  Weight:  . No skin changes  Impression: Tolerating radiation therapy well.  Plan: Continue radiation therapy as planned.

## 2012-08-31 NOTE — Progress Notes (Signed)
Patient here weekly radiation therapy 1st treatment  Of 17 completed, post sim teaching, skin  products alra and radiaplex gel given along with radiation therapy and you book , no c/o pain , no skin changes  9:09 AM

## 2012-09-01 ENCOUNTER — Ambulatory Visit
Admission: RE | Admit: 2012-09-01 | Discharge: 2012-09-01 | Disposition: A | Discharge status: Home / Self Care | Payer: Medicare Other | Source: Ambulatory Visit | Attending: Radiation Oncology | Admitting: Radiation Oncology

## 2012-09-01 DIAGNOSIS — R0602 Shortness of breath: Secondary | ICD-10-CM | POA: Diagnosis not present

## 2012-09-01 DIAGNOSIS — L988 Other specified disorders of the skin and subcutaneous tissue: Secondary | ICD-10-CM | POA: Diagnosis not present

## 2012-09-01 DIAGNOSIS — Z510 Encounter for antineoplastic radiation therapy: Secondary | ICD-10-CM | POA: Diagnosis not present

## 2012-09-01 DIAGNOSIS — C50919 Malignant neoplasm of unspecified site of unspecified female breast: Secondary | ICD-10-CM | POA: Diagnosis not present

## 2012-09-01 DIAGNOSIS — J069 Acute upper respiratory infection, unspecified: Secondary | ICD-10-CM | POA: Diagnosis not present

## 2012-09-01 DIAGNOSIS — J449 Chronic obstructive pulmonary disease, unspecified: Secondary | ICD-10-CM | POA: Diagnosis not present

## 2012-09-02 ENCOUNTER — Ambulatory Visit
Admission: RE | Admit: 2012-09-02 | Discharge: 2012-09-02 | Disposition: A | Discharge status: Home / Self Care | Payer: Medicare Other | Source: Ambulatory Visit | Attending: Radiation Oncology | Admitting: Radiation Oncology

## 2012-09-02 DIAGNOSIS — J069 Acute upper respiratory infection, unspecified: Secondary | ICD-10-CM | POA: Diagnosis not present

## 2012-09-02 DIAGNOSIS — L988 Other specified disorders of the skin and subcutaneous tissue: Secondary | ICD-10-CM | POA: Diagnosis not present

## 2012-09-02 DIAGNOSIS — J449 Chronic obstructive pulmonary disease, unspecified: Secondary | ICD-10-CM | POA: Diagnosis not present

## 2012-09-02 DIAGNOSIS — Z510 Encounter for antineoplastic radiation therapy: Secondary | ICD-10-CM | POA: Diagnosis not present

## 2012-09-02 DIAGNOSIS — R0602 Shortness of breath: Secondary | ICD-10-CM | POA: Diagnosis not present

## 2012-09-02 DIAGNOSIS — C50919 Malignant neoplasm of unspecified site of unspecified female breast: Secondary | ICD-10-CM | POA: Diagnosis not present

## 2012-09-03 ENCOUNTER — Ambulatory Visit: Payer: Medicare Other | Admitting: Pulmonary Disease

## 2012-09-03 ENCOUNTER — Ambulatory Visit (INDEPENDENT_AMBULATORY_CARE_PROVIDER_SITE_OTHER): Payer: Medicare Other | Admitting: Pulmonary Disease

## 2012-09-03 ENCOUNTER — Ambulatory Visit
Admission: RE | Admit: 2012-09-03 | Discharge: 2012-09-03 | Disposition: A | Discharge status: Home / Self Care | Payer: Medicare Other | Source: Ambulatory Visit | Attending: Radiation Oncology | Admitting: Radiation Oncology

## 2012-09-03 ENCOUNTER — Encounter: Payer: Self-pay | Admitting: Pulmonary Disease

## 2012-09-03 VITALS — BP 140/80 | HR 91 | Temp 98.5°F | Ht 66.0 in | Wt 139.6 lb

## 2012-09-03 DIAGNOSIS — R0602 Shortness of breath: Secondary | ICD-10-CM | POA: Diagnosis not present

## 2012-09-03 DIAGNOSIS — J4489 Other specified chronic obstructive pulmonary disease: Secondary | ICD-10-CM | POA: Diagnosis not present

## 2012-09-03 DIAGNOSIS — C50919 Malignant neoplasm of unspecified site of unspecified female breast: Secondary | ICD-10-CM | POA: Diagnosis not present

## 2012-09-03 DIAGNOSIS — Z510 Encounter for antineoplastic radiation therapy: Secondary | ICD-10-CM | POA: Diagnosis not present

## 2012-09-03 DIAGNOSIS — J449 Chronic obstructive pulmonary disease, unspecified: Secondary | ICD-10-CM

## 2012-09-03 DIAGNOSIS — L988 Other specified disorders of the skin and subcutaneous tissue: Secondary | ICD-10-CM | POA: Diagnosis not present

## 2012-09-03 DIAGNOSIS — J069 Acute upper respiratory infection, unspecified: Secondary | ICD-10-CM | POA: Diagnosis not present

## 2012-09-03 MED ORDER — ALBUTEROL SULFATE HFA 108 (90 BASE) MCG/ACT IN AERS: 2.0000 | INHALATION_SPRAY | Freq: Four times a day (QID) | RESPIRATORY_TRACT | Status: AC | PRN

## 2012-09-03 NOTE — Assessment & Plan Note (Signed)
The patient is slowly returning back to her usual baseline from a breathing standpoint after her recent surgery, but still is having conditioning issues because of her ongoing radiation treatment.  She has not had a definite pulmonary infection, and feels that she did improve with her short course of prednisone at the last visit.  I have asked her to continue on her current bronchodilator regimen, and a followup with me in 4 months.

## 2012-09-03 NOTE — Patient Instructions (Addendum)
No change in medications Try and stay active, and build conditioning back up . followup with me in 4 mos, but call if having issues.

## 2012-09-03 NOTE — Addendum Note (Signed)
Addended by: Nita Sells on: 09/03/2012 12:01 PM   Modules accepted: Orders

## 2012-09-03 NOTE — Progress Notes (Signed)
  Subjective:    Patient ID: [name], female    DOB: Feb 20, 1930, 78 y.o.   MRN: [id number]  HPI The patient comes in today for followup of her known COPD.  She had had increased breathing issues after her recent surgery, and was given a short course of prednisone to last visit.  She saw definite improvement with this, but still has not quite gotten back to her usual baseline.  She denies any chest congestion or purulent mucus.  She has not tried to get back to her conditioning program, since she has fatigue because of her radiation treatments.   Review of Systems  Constitutional: Negative for fever and unexpected weight change.  HENT: Negative for ear pain, nosebleeds, congestion, sore throat, rhinorrhea, sneezing, trouble swallowing, dental problem, postnasal drip and sinus pressure.   Eyes: Negative for redness and itching.  Respiratory: Negative for cough, chest tightness, shortness of breath and wheezing.   Cardiovascular: Negative for palpitations and leg swelling.  Gastrointestinal: Negative for nausea and vomiting.  Genitourinary: Negative for dysuria.  Musculoskeletal: Positive for joint swelling.       With arthritis  Skin: Negative for rash.  Neurological: Negative for headaches.  Hematological: Bruises/bleeds easily.  Psychiatric/Behavioral: Negative for dysphoric mood. The patient is nervous/anxious.        Objective:   Physical Exam Well-developed female in no acute distress Nose without purulence or discharge noted Neck without lymphadenopathy or thyromegaly Chest with decreased breath sounds, but no wheezing or rhonchi Cardiac exam with regular rate and rhythm Lower extremities without edema, cyanosis Alert and oriented, moves all 4 extremities.       Assessment & Plan:

## 2012-09-04 ENCOUNTER — Ambulatory Visit
Admission: RE | Admit: 2012-09-04 | Discharge: 2012-09-04 | Disposition: A | Discharge status: Home / Self Care | Payer: Medicare Other | Source: Ambulatory Visit | Attending: Radiation Oncology | Admitting: Radiation Oncology

## 2012-09-04 DIAGNOSIS — Z510 Encounter for antineoplastic radiation therapy: Secondary | ICD-10-CM | POA: Diagnosis not present

## 2012-09-04 DIAGNOSIS — J069 Acute upper respiratory infection, unspecified: Secondary | ICD-10-CM | POA: Diagnosis not present

## 2012-09-04 DIAGNOSIS — J449 Chronic obstructive pulmonary disease, unspecified: Secondary | ICD-10-CM | POA: Diagnosis not present

## 2012-09-04 DIAGNOSIS — R0602 Shortness of breath: Secondary | ICD-10-CM | POA: Diagnosis not present

## 2012-09-04 DIAGNOSIS — L988 Other specified disorders of the skin and subcutaneous tissue: Secondary | ICD-10-CM | POA: Diagnosis not present

## 2012-09-04 DIAGNOSIS — C50919 Malignant neoplasm of unspecified site of unspecified female breast: Secondary | ICD-10-CM | POA: Diagnosis not present

## 2012-09-05 ENCOUNTER — Encounter: Payer: Self-pay | Admitting: Radiation Oncology

## 2012-09-07 ENCOUNTER — Ambulatory Visit
Admission: RE | Admit: 2012-09-07 | Discharge: 2012-09-07 | Disposition: A | Discharge status: Home / Self Care | Payer: Medicare Other | Source: Ambulatory Visit | Attending: Radiation Oncology | Admitting: Radiation Oncology

## 2012-09-07 ENCOUNTER — Encounter: Payer: Self-pay | Admitting: Radiation Oncology

## 2012-09-07 VITALS — BP 148/83 | HR 81 | Temp 97.6°F | Resp 20

## 2012-09-07 DIAGNOSIS — R0602 Shortness of breath: Secondary | ICD-10-CM | POA: Diagnosis not present

## 2012-09-07 DIAGNOSIS — J069 Acute upper respiratory infection, unspecified: Secondary | ICD-10-CM | POA: Diagnosis not present

## 2012-09-07 DIAGNOSIS — C50119 Malignant neoplasm of central portion of unspecified female breast: Secondary | ICD-10-CM | POA: Diagnosis not present

## 2012-09-07 DIAGNOSIS — J449 Chronic obstructive pulmonary disease, unspecified: Secondary | ICD-10-CM | POA: Diagnosis not present

## 2012-09-07 DIAGNOSIS — C50919 Malignant neoplasm of unspecified site of unspecified female breast: Secondary | ICD-10-CM | POA: Diagnosis not present

## 2012-09-07 DIAGNOSIS — Z510 Encounter for antineoplastic radiation therapy: Secondary | ICD-10-CM | POA: Diagnosis not present

## 2012-09-07 DIAGNOSIS — L988 Other specified disorders of the skin and subcutaneous tissue: Secondary | ICD-10-CM | POA: Diagnosis not present

## 2012-09-07 NOTE — Progress Notes (Signed)
Weekly Management Note:  Site: Right breast Current Dose:  1500  cGy Projected Dose: 4250  CGy followed by 750 cGy boost in 3 sessions  Narrative: The patient is seen today for routine under treatment assessment. CBCT/MVCT images/port films were reviewed. The chart was reviewed.   She is without complaints today. She uses Radioplex gel twice a day.  Physical Examination:  Filed Vitals:   09/07/12 0930  BP: 148/83  Pulse: 81  Temp: 97.6 F (36.4 C)  Resp: 20  .  Weight:  . There is faint erythema the skin along the right breast with slight hyperpigmentation as well. No areas of desquamation.  Impression: Tolerating radiation therapy well.  Plan: Continue radiation therapy as planned.

## 2012-09-07 NOTE — Progress Notes (Signed)
Patient here for rad txs, compleetd 6/17 completed, alert,oriented x3, very slight erythema on right breast, using radiaplex gel bid, no c/o pain 98% room air sats  9:34 AM

## 2012-09-08 ENCOUNTER — Ambulatory Visit
Admission: RE | Admit: 2012-09-08 | Discharge: 2012-09-08 | Disposition: A | Discharge status: Home / Self Care | Payer: Medicare Other | Source: Ambulatory Visit | Attending: Radiation Oncology | Admitting: Radiation Oncology

## 2012-09-08 DIAGNOSIS — Z510 Encounter for antineoplastic radiation therapy: Secondary | ICD-10-CM | POA: Diagnosis not present

## 2012-09-08 DIAGNOSIS — C50919 Malignant neoplasm of unspecified site of unspecified female breast: Secondary | ICD-10-CM | POA: Diagnosis not present

## 2012-09-08 DIAGNOSIS — J069 Acute upper respiratory infection, unspecified: Secondary | ICD-10-CM | POA: Diagnosis not present

## 2012-09-08 DIAGNOSIS — J449 Chronic obstructive pulmonary disease, unspecified: Secondary | ICD-10-CM | POA: Diagnosis not present

## 2012-09-08 DIAGNOSIS — R0602 Shortness of breath: Secondary | ICD-10-CM | POA: Diagnosis not present

## 2012-09-08 DIAGNOSIS — L988 Other specified disorders of the skin and subcutaneous tissue: Secondary | ICD-10-CM | POA: Diagnosis not present

## 2012-09-09 ENCOUNTER — Ambulatory Visit
Admission: RE | Admit: 2012-09-09 | Discharge: 2012-09-09 | Disposition: A | Discharge status: Home / Self Care | Payer: Medicare Other | Source: Ambulatory Visit | Attending: Radiation Oncology | Admitting: Radiation Oncology

## 2012-09-09 DIAGNOSIS — J449 Chronic obstructive pulmonary disease, unspecified: Secondary | ICD-10-CM | POA: Diagnosis not present

## 2012-09-09 DIAGNOSIS — L988 Other specified disorders of the skin and subcutaneous tissue: Secondary | ICD-10-CM | POA: Diagnosis not present

## 2012-09-09 DIAGNOSIS — Z510 Encounter for antineoplastic radiation therapy: Secondary | ICD-10-CM | POA: Diagnosis not present

## 2012-09-09 DIAGNOSIS — C50919 Malignant neoplasm of unspecified site of unspecified female breast: Secondary | ICD-10-CM | POA: Diagnosis not present

## 2012-09-09 DIAGNOSIS — J069 Acute upper respiratory infection, unspecified: Secondary | ICD-10-CM | POA: Diagnosis not present

## 2012-09-09 DIAGNOSIS — R0602 Shortness of breath: Secondary | ICD-10-CM | POA: Diagnosis not present

## 2012-09-10 ENCOUNTER — Ambulatory Visit
Admission: RE | Admit: 2012-09-10 | Discharge: 2012-09-10 | Disposition: A | Discharge status: Home / Self Care | Payer: Medicare Other | Source: Ambulatory Visit | Attending: Radiation Oncology | Admitting: Radiation Oncology

## 2012-09-10 DIAGNOSIS — J069 Acute upper respiratory infection, unspecified: Secondary | ICD-10-CM | POA: Diagnosis not present

## 2012-09-10 DIAGNOSIS — Z510 Encounter for antineoplastic radiation therapy: Secondary | ICD-10-CM | POA: Diagnosis not present

## 2012-09-10 DIAGNOSIS — R0602 Shortness of breath: Secondary | ICD-10-CM | POA: Diagnosis not present

## 2012-09-10 DIAGNOSIS — J449 Chronic obstructive pulmonary disease, unspecified: Secondary | ICD-10-CM | POA: Diagnosis not present

## 2012-09-10 DIAGNOSIS — C50919 Malignant neoplasm of unspecified site of unspecified female breast: Secondary | ICD-10-CM | POA: Diagnosis not present

## 2012-09-10 DIAGNOSIS — L988 Other specified disorders of the skin and subcutaneous tissue: Secondary | ICD-10-CM | POA: Diagnosis not present

## 2012-09-11 ENCOUNTER — Ambulatory Visit
Admission: RE | Admit: 2012-09-11 | Discharge: 2012-09-11 | Disposition: A | Discharge status: Home / Self Care | Payer: Medicare Other | Source: Ambulatory Visit | Attending: Radiation Oncology | Admitting: Radiation Oncology

## 2012-09-11 DIAGNOSIS — J069 Acute upper respiratory infection, unspecified: Secondary | ICD-10-CM | POA: Diagnosis not present

## 2012-09-11 DIAGNOSIS — L988 Other specified disorders of the skin and subcutaneous tissue: Secondary | ICD-10-CM | POA: Diagnosis not present

## 2012-09-11 DIAGNOSIS — R0602 Shortness of breath: Secondary | ICD-10-CM | POA: Diagnosis not present

## 2012-09-11 DIAGNOSIS — Z510 Encounter for antineoplastic radiation therapy: Secondary | ICD-10-CM | POA: Diagnosis not present

## 2012-09-11 DIAGNOSIS — J449 Chronic obstructive pulmonary disease, unspecified: Secondary | ICD-10-CM | POA: Diagnosis not present

## 2012-09-11 DIAGNOSIS — C50919 Malignant neoplasm of unspecified site of unspecified female breast: Secondary | ICD-10-CM | POA: Diagnosis not present

## 2012-09-14 ENCOUNTER — Encounter: Payer: Self-pay | Admitting: Radiation Oncology

## 2012-09-14 ENCOUNTER — Ambulatory Visit
Admission: RE | Admit: 2012-09-14 | Discharge: 2012-09-14 | Disposition: A | Discharge status: Home / Self Care | Payer: Medicare Other | Source: Ambulatory Visit | Attending: Radiation Oncology | Admitting: Radiation Oncology

## 2012-09-14 VITALS — BP 152/63 | HR 88 | Temp 98.1°F | Resp 22 | Wt 140.5 lb

## 2012-09-14 DIAGNOSIS — C50119 Malignant neoplasm of central portion of unspecified female breast: Secondary | ICD-10-CM

## 2012-09-14 DIAGNOSIS — L988 Other specified disorders of the skin and subcutaneous tissue: Secondary | ICD-10-CM | POA: Diagnosis not present

## 2012-09-14 DIAGNOSIS — C50919 Malignant neoplasm of unspecified site of unspecified female breast: Secondary | ICD-10-CM | POA: Diagnosis not present

## 2012-09-14 DIAGNOSIS — R0602 Shortness of breath: Secondary | ICD-10-CM | POA: Diagnosis not present

## 2012-09-14 DIAGNOSIS — J449 Chronic obstructive pulmonary disease, unspecified: Secondary | ICD-10-CM | POA: Diagnosis not present

## 2012-09-14 DIAGNOSIS — J069 Acute upper respiratory infection, unspecified: Secondary | ICD-10-CM | POA: Diagnosis not present

## 2012-09-14 DIAGNOSIS — Z510 Encounter for antineoplastic radiation therapy: Secondary | ICD-10-CM | POA: Diagnosis not present

## 2012-09-14 NOTE — Patient Instructions (Signed)
Please discuss your increased shortness of breat with your pulmonologist.

## 2012-09-14 NOTE — Progress Notes (Signed)
   Weekly Management Note:  Outpatient, right breast Current Dose: 27.5 Gy  Projected Dose: 42.5Gy  + boost  Narrative:  The patient presents for routine under treatment assessment.  CBCT/MVCT images/Port film x-rays were reviewed.  The chart was checked. She has been more short of breath since about 4 days ago. No new cough. No fevers. No chest pain. She does take oxygen with exertion and at night. She has COPD. She's not smoking.  Physical Findings:  weight is 140 lb 8 oz (63.73 kg). Her oral temperature is 98.1 F (36.7 C). Her blood pressure is 152/63 and her pulse is 88. Her respiration is 22 and oxygen saturation is 97%.  sitting comfortably in a chair in no acute distress. Skin is slightly erythematous over the right breast  Impression:  The patient is tolerating radiotherapy.  Plan:  Continue radiotherapy as planned. Continue radiaplex. I told the patient that it is extremely unlikely that her shortness of breath is related to the radiotherapy. I recommended that she discuss this further with her pulmonologist.  ________________________________   Lonie Peak, M.D.

## 2012-09-14 NOTE — Progress Notes (Signed)
patient here weekly rad txs, completed 11/17 right breast,very slight erythema, no skin breakdown, on oxygen 2 liters n/c=97%  No c/o pain just short of breath, started last Thursday more after rad tx 9:24 AM

## 2012-09-15 ENCOUNTER — Ambulatory Visit
Admission: RE | Admit: 2012-09-15 | Discharge: 2012-09-15 | Disposition: A | Discharge status: Home / Self Care | Payer: Medicare Other | Source: Ambulatory Visit | Attending: Radiation Oncology | Admitting: Radiation Oncology

## 2012-09-15 DIAGNOSIS — J449 Chronic obstructive pulmonary disease, unspecified: Secondary | ICD-10-CM | POA: Diagnosis not present

## 2012-09-15 DIAGNOSIS — R0602 Shortness of breath: Secondary | ICD-10-CM | POA: Diagnosis not present

## 2012-09-15 DIAGNOSIS — C50919 Malignant neoplasm of unspecified site of unspecified female breast: Secondary | ICD-10-CM | POA: Diagnosis not present

## 2012-09-15 DIAGNOSIS — J069 Acute upper respiratory infection, unspecified: Secondary | ICD-10-CM | POA: Diagnosis not present

## 2012-09-15 DIAGNOSIS — Z510 Encounter for antineoplastic radiation therapy: Secondary | ICD-10-CM | POA: Diagnosis not present

## 2012-09-15 DIAGNOSIS — L988 Other specified disorders of the skin and subcutaneous tissue: Secondary | ICD-10-CM | POA: Diagnosis not present

## 2012-09-17 ENCOUNTER — Telehealth: Payer: Self-pay | Admitting: Pulmonary Disease

## 2012-09-17 ENCOUNTER — Ambulatory Visit
Admission: RE | Admit: 2012-09-17 | Discharge: 2012-09-17 | Disposition: A | Discharge status: Home / Self Care | Payer: Medicare Other | Source: Ambulatory Visit | Attending: Radiation Oncology | Admitting: Radiation Oncology

## 2012-09-17 DIAGNOSIS — J069 Acute upper respiratory infection, unspecified: Secondary | ICD-10-CM | POA: Diagnosis not present

## 2012-09-17 DIAGNOSIS — Z510 Encounter for antineoplastic radiation therapy: Secondary | ICD-10-CM | POA: Diagnosis not present

## 2012-09-17 DIAGNOSIS — C50919 Malignant neoplasm of unspecified site of unspecified female breast: Secondary | ICD-10-CM | POA: Diagnosis not present

## 2012-09-17 DIAGNOSIS — L988 Other specified disorders of the skin and subcutaneous tissue: Secondary | ICD-10-CM | POA: Diagnosis not present

## 2012-09-17 DIAGNOSIS — R0602 Shortness of breath: Secondary | ICD-10-CM | POA: Diagnosis not present

## 2012-09-17 DIAGNOSIS — J449 Chronic obstructive pulmonary disease, unspecified: Secondary | ICD-10-CM | POA: Diagnosis not present

## 2012-09-17 MED ORDER — LEVOFLOXACIN 750 MG PO TABS: 750.0000 mg | ORAL_TABLET | Freq: Every day | ORAL | Status: DC

## 2012-09-17 NOTE — Telephone Encounter (Signed)
Per CY call in [facility]. When I spoke with patient, she requested Levaquin 750 d/t ZPAK not making a difference in past.  Per CY okay Levaquin 750 #7 Take 1 qd. Laure Kidney.   Patient aware that this is being called into pharm. [facility].

## 2012-09-17 NOTE — Telephone Encounter (Signed)
Patient c/o prod cough with yellow mucus. Patient takes radiation weekly. Patient states that Dr Shelle Iron knows her condition and when she gets this bad he will give her abx.  [facility]  Allergies  Allergen Reactions  . Neosporin (Neomycin-Polymyxin B Gu) Itching and Rash  . Codeine Nausea Only  . Irbesartan Hives    Avapro  . Adhesive (Tape) Itching and Rash    To paper tape only. Rash and itching only where applied.    Dr Maple Hudson please advise, thanks.

## 2012-09-18 ENCOUNTER — Ambulatory Visit
Admission: RE | Admit: 2012-09-18 | Discharge: 2012-09-18 | Disposition: A | Discharge status: Home / Self Care | Payer: Medicare Other | Source: Ambulatory Visit | Attending: Radiation Oncology | Admitting: Radiation Oncology

## 2012-09-18 DIAGNOSIS — R0602 Shortness of breath: Secondary | ICD-10-CM | POA: Diagnosis not present

## 2012-09-18 DIAGNOSIS — L988 Other specified disorders of the skin and subcutaneous tissue: Secondary | ICD-10-CM | POA: Diagnosis not present

## 2012-09-18 DIAGNOSIS — J069 Acute upper respiratory infection, unspecified: Secondary | ICD-10-CM | POA: Diagnosis not present

## 2012-09-18 DIAGNOSIS — Z510 Encounter for antineoplastic radiation therapy: Secondary | ICD-10-CM | POA: Diagnosis not present

## 2012-09-18 DIAGNOSIS — C50919 Malignant neoplasm of unspecified site of unspecified female breast: Secondary | ICD-10-CM | POA: Diagnosis not present

## 2012-09-18 DIAGNOSIS — J449 Chronic obstructive pulmonary disease, unspecified: Secondary | ICD-10-CM | POA: Diagnosis not present

## 2012-09-21 ENCOUNTER — Ambulatory Visit
Admission: RE | Admit: 2012-09-21 | Discharge: 2012-09-21 | Disposition: A | Discharge status: Home / Self Care | Payer: Medicare Other | Source: Ambulatory Visit | Attending: Radiation Oncology | Admitting: Radiation Oncology

## 2012-09-21 ENCOUNTER — Encounter: Payer: Self-pay | Admitting: Radiation Oncology

## 2012-09-21 VITALS — BP 151/66 | HR 87 | Temp 97.8°F | Resp 20 | Wt 140.7 lb

## 2012-09-21 DIAGNOSIS — C50119 Malignant neoplasm of central portion of unspecified female breast: Secondary | ICD-10-CM | POA: Diagnosis not present

## 2012-09-21 DIAGNOSIS — L988 Other specified disorders of the skin and subcutaneous tissue: Secondary | ICD-10-CM | POA: Diagnosis not present

## 2012-09-21 DIAGNOSIS — C50919 Malignant neoplasm of unspecified site of unspecified female breast: Secondary | ICD-10-CM | POA: Diagnosis not present

## 2012-09-21 DIAGNOSIS — J449 Chronic obstructive pulmonary disease, unspecified: Secondary | ICD-10-CM | POA: Diagnosis not present

## 2012-09-21 DIAGNOSIS — Z510 Encounter for antineoplastic radiation therapy: Secondary | ICD-10-CM | POA: Diagnosis not present

## 2012-09-21 DIAGNOSIS — R0602 Shortness of breath: Secondary | ICD-10-CM | POA: Diagnosis not present

## 2012-09-21 DIAGNOSIS — J069 Acute upper respiratory infection, unspecified: Secondary | ICD-10-CM | POA: Diagnosis not present

## 2012-09-21 MED ORDER — RADIAPLEXRX EX GEL
Freq: Once | CUTANEOUS | Status: AC
  Administered 2012-09-21: 09:00:00 via TOPICAL

## 2012-09-21 NOTE — Progress Notes (Signed)
Weekly Management Note:  Site: Right breast Current Dose:  3750  cGy Projected Dose: 4250  CGy followed by breast boost  Narrative: The patient is seen today for routine under treatment assessment. CBCT/MVCT images/port films were reviewed. The chart was reviewed.   She is without complaints today. She is on Avelox for a URI. She uses Radioplex gel.  Physical Examination:  Filed Vitals:   09/21/12 0922  BP: 151/66  Pulse: 87  Temp: 97.8 F (36.6 C)  Resp: 20  .  Weight: 140 lb 11.2 oz (63.821 kg). There is hyperpigmentation along with mild erythema the skin along the right breast. No areas of desquamation .  Impression: Tolerating radiation therapy well.  Plan: Continue radiation therapy as planned.

## 2012-09-21 NOTE — Progress Notes (Signed)
Patient here for weekly rad txs, right breast 15/17 completed Alert,oriented x3, on oxygen  At home 2 liters n/c, on Avelox 750,g oral daily started last Thursday, has 7 total to take,  Erythema on breast, gave another radiplex gel tube, almost out, patient denys pain, just tenderness 9:20 AM

## 2012-09-21 NOTE — Progress Notes (Signed)
Simulation note: [name] underwent virtual simulation for her right breast boost with electrons. She was set up en face, RAO. One custom block was constructed to conform the field. I'm prescribing 750 cGy in 3 sessions utilizing 9 MEV electrons prescribed at the 90% isodose curve. A special port plan is requested. Electron beam energy was chosen based on the depth of the tumor bed as seen on her treatment planning CT scan.

## 2012-09-22 ENCOUNTER — Ambulatory Visit
Admission: RE | Admit: 2012-09-22 | Discharge: 2012-09-22 | Disposition: A | Discharge status: Home / Self Care | Payer: Medicare Other | Source: Ambulatory Visit | Attending: Radiation Oncology | Admitting: Radiation Oncology

## 2012-09-22 ENCOUNTER — Ambulatory Visit (HOSPITAL_BASED_OUTPATIENT_CLINIC_OR_DEPARTMENT_OTHER): Payer: Medicare Other | Admitting: Hematology & Oncology

## 2012-09-22 ENCOUNTER — Other Ambulatory Visit (HOSPITAL_BASED_OUTPATIENT_CLINIC_OR_DEPARTMENT_OTHER): Payer: Medicare Other | Admitting: Lab

## 2012-09-22 VITALS — BP 155/63 | HR 91 | Temp 98.1°F | Resp 16 | Ht 66.0 in | Wt 139.0 lb

## 2012-09-22 DIAGNOSIS — E559 Vitamin D deficiency, unspecified: Secondary | ICD-10-CM | POA: Diagnosis not present

## 2012-09-22 DIAGNOSIS — D0590 Unspecified type of carcinoma in situ of unspecified breast: Secondary | ICD-10-CM | POA: Diagnosis not present

## 2012-09-22 DIAGNOSIS — C50119 Malignant neoplasm of central portion of unspecified female breast: Secondary | ICD-10-CM | POA: Diagnosis not present

## 2012-09-22 DIAGNOSIS — R0602 Shortness of breath: Secondary | ICD-10-CM | POA: Diagnosis not present

## 2012-09-22 DIAGNOSIS — J449 Chronic obstructive pulmonary disease, unspecified: Secondary | ICD-10-CM | POA: Diagnosis not present

## 2012-09-22 DIAGNOSIS — D0510 Intraductal carcinoma in situ of unspecified breast: Secondary | ICD-10-CM

## 2012-09-22 DIAGNOSIS — C50919 Malignant neoplasm of unspecified site of unspecified female breast: Secondary | ICD-10-CM | POA: Diagnosis not present

## 2012-09-22 DIAGNOSIS — M810 Age-related osteoporosis without current pathological fracture: Secondary | ICD-10-CM

## 2012-09-22 DIAGNOSIS — L988 Other specified disorders of the skin and subcutaneous tissue: Secondary | ICD-10-CM | POA: Diagnosis not present

## 2012-09-22 DIAGNOSIS — J069 Acute upper respiratory infection, unspecified: Secondary | ICD-10-CM | POA: Diagnosis not present

## 2012-09-22 DIAGNOSIS — Z510 Encounter for antineoplastic radiation therapy: Secondary | ICD-10-CM | POA: Diagnosis not present

## 2012-09-22 LAB — CBC WITH DIFFERENTIAL (CANCER CENTER ONLY)
BASO#: 0 10*3/uL (ref 0.0–0.2)
Eosinophils Absolute: 0.1 10*3/uL (ref 0.0–0.5)
HCT: 38.5 % (ref 34.8–46.6)
HGB: 12.7 g/dL (ref 11.6–15.9)
LYMPH%: 13.2 % — ABNORMAL LOW (ref 14.0–48.0)
MCV: 92 fL (ref 81–101)
MONO#: 0.5 10*3/uL (ref 0.1–0.9)
NEUT%: 78.4 % (ref 39.6–80.0)
Platelets: 201 10*3/uL (ref 145–400)
RBC: 4.19 10*6/uL (ref 3.70–5.32)
WBC: 6.7 10*3/uL (ref 3.9–10.0)

## 2012-09-22 NOTE — Progress Notes (Signed)
This office note has been dictated.

## 2012-09-23 LAB — COMPREHENSIVE METABOLIC PANEL
ALT: 16 U/L (ref 0–35)
AST: 20 U/L (ref 0–37)
Albumin: 3.8 g/dL (ref 3.5–5.2)
Alkaline Phosphatase: 67 U/L (ref 39–117)
BUN: 9 mg/dL (ref 6–23)
Calcium: 9.2 mg/dL (ref 8.4–10.5)
Chloride: 102 mEq/L (ref 96–112)
Creatinine, Ser: 0.53 mg/dL (ref 0.50–1.10)
Sodium: 139 mEq/L (ref 135–145)

## 2012-09-23 NOTE — Progress Notes (Signed)
CC:   Quita Skye. [name], M.D. [name], MD,FCCP [name]. [name], M.D.  DIAGNOSES: 1. Ductal carcinoma in situ of the right breast. 2. 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  CURRENT THERAPY:  Patient to complete radiation therapy next week.  INTERIM HISTORY:  [name] comes in for followup.  She is doing well with radiation.  We decided that she does not need tamoxifen.  At her age, there probably would not be a lot of benefit for tamoxifen.  She has had no problems with the radiation.  There is no fatigue.  There is no skin breakdown.  She has had no cough or shortness breath.  She does have fairly significant COPD.  This is not been affected.  PHYSICAL EXAMINATION:  This is an elderly white female in no obvious distress.  Vital signs:  Temperature of 98.1, pulse 91, respiratory rate 16, blood pressure 155/63.  Weight is 139.  Head and neck: Normocephalic, atraumatic skull.  There are no ocular or oral lesions. There are no palpable cervical or supraclavicular lymph nodes bilaterally.  Cardiac:  Regular rate and rhythm with normal S1 and S2. There are no murmurs, rubs or bruits.  Breasts:  Left breast with a well- healed lumpectomy at the, I think, 7 o'clock position.  There is no discrete mass in the left breast.  There is no left axillary adenopathy. Right breast shows a well-healed lumpectomy at the 8 o'clock position. There is some slight swelling of the right breast.  There is some slight erythema of the right breast.  No tenderness is noted to palpation. There is no right axillary adenopathy.  Extremities:  Some osteoarthritic changes in her joints.  Abdomen:  Soft with good bowel sounds.  There is no fluid wave.  There is no palpable hepatosplenomegaly.  Back:  No kyphosis.  LABORATORY DATA:  White cell count 6.7, hemoglobin 12.7, hematocrit 38.5, platelet count 201.  IMPRESSION:  [name] is a very nice 79 year old white female  with ductal carcinoma in situ of the right breast.  She is completing radiation therapy next week.  After that, we will just watch her. Again, do not think that there would be much benefit to tamoxifen given her age.  We will go ahead and see her back in 3 months from now.   ______________________________ Josph Macho, M.D. PRE/MEDQ  D:  09/22/2012  T:  09/23/2012  Job:  [id number]

## 2012-09-24 ENCOUNTER — Ambulatory Visit
Admission: RE | Admit: 2012-09-24 | Discharge: 2012-09-24 | Disposition: A | Discharge status: Home / Self Care | Payer: Medicare Other | Source: Ambulatory Visit | Attending: Radiation Oncology | Admitting: Radiation Oncology

## 2012-09-24 DIAGNOSIS — J449 Chronic obstructive pulmonary disease, unspecified: Secondary | ICD-10-CM | POA: Diagnosis not present

## 2012-09-24 DIAGNOSIS — C50919 Malignant neoplasm of unspecified site of unspecified female breast: Secondary | ICD-10-CM | POA: Diagnosis not present

## 2012-09-24 DIAGNOSIS — J069 Acute upper respiratory infection, unspecified: Secondary | ICD-10-CM | POA: Diagnosis not present

## 2012-09-24 DIAGNOSIS — L988 Other specified disorders of the skin and subcutaneous tissue: Secondary | ICD-10-CM | POA: Diagnosis not present

## 2012-09-24 DIAGNOSIS — R0602 Shortness of breath: Secondary | ICD-10-CM | POA: Diagnosis not present

## 2012-09-24 DIAGNOSIS — Z510 Encounter for antineoplastic radiation therapy: Secondary | ICD-10-CM | POA: Diagnosis not present

## 2012-09-25 ENCOUNTER — Telehealth: Payer: Self-pay | Admitting: Radiation Oncology

## 2012-09-25 ENCOUNTER — Ambulatory Visit
Admission: RE | Admit: 2012-09-25 | Discharge: 2012-09-25 | Disposition: A | Discharge status: Home / Self Care | Payer: Medicare Other | Source: Ambulatory Visit | Attending: Radiation Oncology | Admitting: Radiation Oncology

## 2012-09-25 DIAGNOSIS — C50919 Malignant neoplasm of unspecified site of unspecified female breast: Secondary | ICD-10-CM | POA: Diagnosis not present

## 2012-09-25 DIAGNOSIS — L988 Other specified disorders of the skin and subcutaneous tissue: Secondary | ICD-10-CM | POA: Diagnosis not present

## 2012-09-25 DIAGNOSIS — J069 Acute upper respiratory infection, unspecified: Secondary | ICD-10-CM | POA: Diagnosis not present

## 2012-09-25 DIAGNOSIS — Z510 Encounter for antineoplastic radiation therapy: Secondary | ICD-10-CM | POA: Diagnosis not present

## 2012-09-25 DIAGNOSIS — J449 Chronic obstructive pulmonary disease, unspecified: Secondary | ICD-10-CM | POA: Diagnosis not present

## 2012-09-25 DIAGNOSIS — R0602 Shortness of breath: Secondary | ICD-10-CM | POA: Diagnosis not present

## 2012-09-25 NOTE — Telephone Encounter (Signed)
Sending the [employer] claim and printout copy of the radiation billing via mail in a self addressed envelope from the pt today, as well as, a letter advising to contact the numbers listed below for addl assistance at this time with any insurance claims, cancer policies, fmla etc    [facility] Patient Billing Department:    [phone]  or  [phone] [facility] Health Physician Billing Department:    [phone] [facility] Radiation Oncologists:   [phone]  or  [phone]

## 2012-09-28 ENCOUNTER — Ambulatory Visit
Admission: RE | Admit: 2012-09-28 | Discharge: 2012-09-28 | Disposition: A | Discharge status: Home / Self Care | Payer: Medicare Other | Source: Ambulatory Visit | Attending: Radiation Oncology | Admitting: Radiation Oncology

## 2012-09-28 ENCOUNTER — Encounter: Payer: Self-pay | Admitting: Radiation Oncology

## 2012-09-28 VITALS — BP 156/71 | HR 102 | Temp 98.2°F | Resp 22 | Wt 139.3 lb

## 2012-09-28 DIAGNOSIS — J449 Chronic obstructive pulmonary disease, unspecified: Secondary | ICD-10-CM | POA: Diagnosis not present

## 2012-09-28 DIAGNOSIS — C50919 Malignant neoplasm of unspecified site of unspecified female breast: Secondary | ICD-10-CM | POA: Diagnosis not present

## 2012-09-28 DIAGNOSIS — L988 Other specified disorders of the skin and subcutaneous tissue: Secondary | ICD-10-CM | POA: Diagnosis not present

## 2012-09-28 DIAGNOSIS — R0602 Shortness of breath: Secondary | ICD-10-CM | POA: Diagnosis not present

## 2012-09-28 DIAGNOSIS — J069 Acute upper respiratory infection, unspecified: Secondary | ICD-10-CM | POA: Diagnosis not present

## 2012-09-28 DIAGNOSIS — Z510 Encounter for antineoplastic radiation therapy: Secondary | ICD-10-CM | POA: Diagnosis not present

## 2012-09-28 NOTE — Progress Notes (Signed)
Weekly Management Note:  Site: Right breast Current Dose:  4750  cGy Projected Dose: 5000  cGy  Narrative: The patient is seen today for routine under treatment assessment. CBCT/MVCT images/port films were reviewed. The chart was reviewed.   She is without complaints today. She is using Radioplex gel.  Physical Examination:  Filed Vitals:   09/28/12 1521  BP: 156/71  Pulse: 102  Temp: 98.2 F (36.8 C)  Resp: 22  .  Weight: 139 lb 4.8 oz (63.186 kg). There is moderate to marked hyperpigmentation the skin along the right breast with patchy dry desquamation. No areas of moist desquamation.  Impression: Tolerating radiation therapy well. She finishes her radiation therapy tomorrow.  Plan: Continue radiation therapy as planned. One-month followup visit after completion of radiation therapy tomorrow.

## 2012-09-28 NOTE — Progress Notes (Signed)
Patient here weekly rad tx right breast, 19/20 completed breast has  erythema, looks like start of dermatitis on breast,skin intact, 96% room air satsc/o soreness mostly 3:20 PM

## 2012-09-29 ENCOUNTER — Ambulatory Visit
Admission: RE | Admit: 2012-09-29 | Discharge: 2012-09-29 | Disposition: A | Discharge status: Home / Self Care | Payer: Medicare Other | Source: Ambulatory Visit | Attending: Radiation Oncology | Admitting: Radiation Oncology

## 2012-09-29 ENCOUNTER — Ambulatory Visit: Payer: Medicare Other

## 2012-09-29 ENCOUNTER — Encounter: Payer: Self-pay | Admitting: Radiation Oncology

## 2012-09-29 DIAGNOSIS — J069 Acute upper respiratory infection, unspecified: Secondary | ICD-10-CM | POA: Diagnosis not present

## 2012-09-29 DIAGNOSIS — J449 Chronic obstructive pulmonary disease, unspecified: Secondary | ICD-10-CM | POA: Diagnosis not present

## 2012-09-29 DIAGNOSIS — R0602 Shortness of breath: Secondary | ICD-10-CM | POA: Diagnosis not present

## 2012-09-29 DIAGNOSIS — C50919 Malignant neoplasm of unspecified site of unspecified female breast: Secondary | ICD-10-CM | POA: Diagnosis not present

## 2012-09-29 DIAGNOSIS — Z510 Encounter for antineoplastic radiation therapy: Secondary | ICD-10-CM | POA: Diagnosis not present

## 2012-09-29 DIAGNOSIS — L988 Other specified disorders of the skin and subcutaneous tissue: Secondary | ICD-10-CM | POA: Diagnosis not present

## 2012-09-29 NOTE — Progress Notes (Signed)
[facility] Radiation Oncology End of Treatment Note  Name:[name]  Date: 09/29/2012 WUJ:[id number] DOB:October 19, 1929   Status:outpatient    CC: Georgann Housekeeper, MD  Dr. Claud Kelp  REFERRING PHYSICIAN: Dr. Claud Kelp   DIAGNOSIS: Stage 0 (Tis N0 M0) low to intermediate grade DCIS of the right breast   INDICATION FOR TREATMENT: Curative   TREATMENT DATES: 08/31/2012 through 09/28/2012                           SITE/DOSE:  Right breast 4250 cGy 17 sessions followed by right breast boost 750 cGy 3 sessions                          BEAMS/ENERGY: 6 MV photons tangential fields to the right breast.     9 MEV electrons, right breast boost             NARRATIVE:    [name] tolerated treatment beautifully with only moderate erythema/hyperpigmentation the skin  by completion of therapy. She used Radioplex gel during her course of treatment.                    PLAN: Routine followup in one month. Patient instructed to call if questions or worsening complaints in interim.

## 2012-09-30 ENCOUNTER — Ambulatory Visit: Payer: Medicare Other

## 2012-10-01 ENCOUNTER — Ambulatory Visit: Payer: Medicare Other

## 2012-10-02 ENCOUNTER — Ambulatory Visit: Payer: Medicare Other

## 2012-10-05 ENCOUNTER — Ambulatory Visit: Payer: Medicare Other

## 2012-10-06 ENCOUNTER — Ambulatory Visit: Payer: Medicare Other

## 2012-10-07 ENCOUNTER — Ambulatory Visit: Payer: Medicare Other

## 2012-10-08 ENCOUNTER — Ambulatory Visit: Payer: Medicare Other

## 2012-10-09 ENCOUNTER — Ambulatory Visit: Payer: Medicare Other

## 2012-10-12 ENCOUNTER — Ambulatory Visit: Payer: Medicare Other

## 2012-10-13 ENCOUNTER — Ambulatory Visit: Payer: Medicare Other

## 2012-10-14 ENCOUNTER — Ambulatory Visit: Payer: Medicare Other

## 2012-10-15 ENCOUNTER — Ambulatory Visit: Payer: Medicare Other

## 2012-10-16 ENCOUNTER — Ambulatory Visit: Payer: Medicare Other

## 2012-10-27 ENCOUNTER — Ambulatory Visit: Payer: Medicare Other | Admitting: Radiation Oncology

## 2012-10-27 ENCOUNTER — Ambulatory Visit: Payer: Self-pay | Admitting: Radiation Oncology

## 2012-11-02 ENCOUNTER — Encounter: Payer: Self-pay | Admitting: Radiation Oncology

## 2012-11-03 ENCOUNTER — Ambulatory Visit
Admission: RE | Admit: 2012-11-03 | Discharge: 2012-11-03 | Disposition: A | Discharge status: Home / Self Care | Payer: Medicare Other | Source: Ambulatory Visit | Attending: Radiation Oncology | Admitting: Radiation Oncology

## 2012-11-03 VITALS — BP 150/62 | HR 99 | Temp 97.6°F | Wt 140.9 lb

## 2012-11-03 DIAGNOSIS — C50919 Malignant neoplasm of unspecified site of unspecified female breast: Secondary | ICD-10-CM

## 2012-11-03 HISTORY — DX: Personal history of irradiation: Z92.3

## 2012-11-03 NOTE — Progress Notes (Signed)
Followup note:  [name] returns today approximately 1 month following completion of radiation therapy following conservative surgery in the management of her low to intermediate grade DCIS of the right breast. She is without complaints today. She did not want to be on tamoxifen and this was discontinued by Dr. Myna Hidalgo. She sees Dr. Myna Hidalgo every 3 months. She'll see Dr. [name] again for a followup visit in April. Of note is that she typically has annual mammography in August or September each year.  Physical simulation: Alert and oriented. Wt Readings from Last 3 Encounters:  11/03/12 140 lb 14.4 oz (63.912 kg)  09/28/12 139 lb 4.8 oz (63.186 kg)  09/22/12 139 lb (63.05 kg)   Temp Readings from Last 3 Encounters:  11/03/12 97.6 F (36.4 C)   09/28/12 98.2 F (36.8 C) Oral  09/22/12 98.1 F (36.7 C) Oral   BP Readings from Last 3 Encounters:  11/03/12 150/62  09/28/12 156/71  09/22/12 155/63   Pulse Readings from Last 3 Encounters:  11/03/12 99  09/28/12 102  09/22/12 91   Head and neck examination: Grossly unremarkable. Nodes: Without palpable cervical, supraclavicular, or axillary lymphadenopathy. Chest: Lungs clear. Breasts: There is residual hyperpigmentation the skin along the right breast with patchy dry desquamation. There is slight thickening of the right breast. No dominant masses are appreciated. Left breast without masses or lesions. Abdomen without hepatomegaly. Extremities: Without edema.  Impression: Satisfactory progress.  Plan: She'll see Dr. [name] is April and Dr. Myna Hidalgo every 3 months. I think she can wait until late summer or early fall to have her baseline right breast mammogram and screening left breast mammogram.

## 2012-11-03 NOTE — Progress Notes (Signed)
Patient here for routine follow up completion of right breast radiation 09/28/2012.Denies pain.Skin has healed.Patient will not be taking tamoxifen.

## 2012-11-16 ENCOUNTER — Telehealth: Payer: Self-pay | Admitting: Pulmonary Disease

## 2012-11-16 MED ORDER — PREDNISONE 10 MG PO TABS: ORAL_TABLET | ORAL | Status: DC

## 2012-11-16 MED ORDER — LEVOFLOXACIN 750 MG PO TABS: 750.0000 mg | ORAL_TABLET | Freq: Every day | ORAL | Status: DC

## 2012-11-16 NOTE — Telephone Encounter (Signed)
I spoke with the pt and she is c/o having productive cough with green phlegm, increased SOB with exertion, chest tightness since Sunday. Pt denies any wheezing. Pt REFUSES an appt with anyone but KC. I advised his next available is not until Wed and she should not wait until then for treatment due to her having COPD. Pt requests that [facility] call her in prescription to get her started on treatment and she will then come in for appt later in the week if needed. Please advise. Carron Curie, CMA Allergies  Allergen Reactions  . Neosporin (Neomycin-Polymyxin B Gu) Itching and Rash  . Codeine Nausea Only  . Irbesartan Hives    Avapro  . Adhesive (Tape) Itching and Rash    To paper tape only. Rash and itching only where applied.

## 2012-11-16 NOTE — Telephone Encounter (Signed)
Levaquin 750 1 qd x 5 days  #5 Pred 10mg  take 40mg  x 2 days, 30mg  x 2 days, 20mg  x 2 days then stop. #18  Called into [facility].Marland KitchenMarland KitchenPt is aware.

## 2012-11-16 NOTE — Telephone Encounter (Signed)
Ok to call in levaquin 750mg  one a day for 5 days Also prednisone 10mg , 40mg  daily for 2 days, then 30 for 2 days, then 20 for 2 days, then stop.

## 2012-12-21 ENCOUNTER — Other Ambulatory Visit (HOSPITAL_BASED_OUTPATIENT_CLINIC_OR_DEPARTMENT_OTHER): Payer: Medicare Other | Admitting: Lab

## 2012-12-21 ENCOUNTER — Ambulatory Visit (HOSPITAL_BASED_OUTPATIENT_CLINIC_OR_DEPARTMENT_OTHER): Payer: Medicare Other | Admitting: Hematology & Oncology

## 2012-12-21 VITALS — BP 150/58 | HR 83 | Temp 97.6°F | Resp 16 | Ht 66.0 in | Wt 139.0 lb

## 2012-12-21 DIAGNOSIS — D0590 Unspecified type of carcinoma in situ of unspecified breast: Secondary | ICD-10-CM

## 2012-12-21 DIAGNOSIS — E559 Vitamin D deficiency, unspecified: Secondary | ICD-10-CM | POA: Diagnosis not present

## 2012-12-21 DIAGNOSIS — M810 Age-related osteoporosis without current pathological fracture: Secondary | ICD-10-CM | POA: Diagnosis not present

## 2012-12-21 DIAGNOSIS — Z87898 Personal history of other specified conditions: Secondary | ICD-10-CM

## 2012-12-21 DIAGNOSIS — D0510 Intraductal carcinoma in situ of unspecified breast: Secondary | ICD-10-CM

## 2012-12-21 DIAGNOSIS — C50111 Malignant neoplasm of central portion of right female breast: Secondary | ICD-10-CM

## 2012-12-21 LAB — CBC WITH DIFFERENTIAL (CANCER CENTER ONLY)
BASO#: 0 10*3/uL (ref 0.0–0.2)
EOS%: 2.2 % (ref 0.0–7.0)
Eosinophils Absolute: 0.1 10*3/uL (ref 0.0–0.5)
HGB: 13 g/dL (ref 11.6–15.9)
LYMPH%: 18.2 % (ref 14.0–48.0)
MCH: 30.4 pg (ref 26.0–34.0)
MCHC: 33.2 g/dL (ref 32.0–36.0)
MCV: 92 fL (ref 81–101)
MONO%: 7.4 % (ref 0.0–13.0)
NEUT#: 4.2 10*3/uL (ref 1.5–6.5)
NEUT%: 71.9 % (ref 39.6–80.0)
RBC: 4.27 10*6/uL (ref 3.70–5.32)

## 2012-12-21 NOTE — Progress Notes (Signed)
This office note has been dictated.

## 2012-12-21 NOTE — Progress Notes (Signed)
CC:   [name]. [name], M.D. [name]. [name], M.D. Erika Share, MD,FCCP  DIAGNOSES: 1. Ductal carcinoma in situ of the right breast. 2. 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  CURRENT THERAPY:  Observation.  INTERIM HISTORY:  [name] comes in for followup.  She tolerated radiation therapy well.  She did not want any further surgery or tamoxifen.  Her final treatment was back in early January.  She received 4250 rad followed by a boost of 750 rad.  She has had no problems with her COPD.  She got through the wintertime. They did lose their power for a little bit but she managed without any problems.  She has had no abdominal pain.  There has been no change in bowel or bladder habits.  She has had no leg swelling.  There have been no rashes.  PHYSICAL EXAMINATION:  General:  This is a well-developed, well- nourished white female in no obvious distress.  Vital signs:  Show temperature of 97.6, pulse 83, respiratory rate 18, blood pressure 150/58.  Weight is 139.  Head and neck:  Shows a normocephalic, atraumatic skull.  There are no ocular or oral lesions.  There are no palpable cervical or supraclavicular lymph nodes.  Lungs:  Clear bilaterally.  Cardiac:  Regular rate and rhythm with a normal S1, S2. There are no murmurs, rubs or bruits.  Breasts:  Shows left breast with a well-healed lumpectomy at the 7 o'clock position.  No obvious masses noted in the left breast.  There is no left axillary adenopathy.  Right breast is slightly contracted from radiation.  She had lumpectomy at the 8 o'clock position.  No tenderness is noted over the right breast. There is no right axillary adenopathy.  Back:  Shows some slight kyphosis.  No tenderness is noted over the spine, ribs or hips. Extremities:  Show no clubbing, cyanosis or edema.  Neurological:  Shows no focal neurological deficits.  LABORATORY STUDIES:  White cell count 5.8, hemoglobin 13,  hematocrit 39, platelet count 206.  IMPRESSION:  [name] is a very charming 80 year old white female with recent diagnosis of ductal carcinoma in situ of the right breast.  She underwent lumpectomy followed by surgery.  She does not want tamoxifen.  We will continue to follow her along in 3 month intervals.  I will go ahead and plan to get her back in 3 months.  I think if all looks good in 3 months, then maybe we can go every 6 months.    ______________________________ Josph Macho, M.D. PRE/MEDQ  D:  12/21/2012  T:  12/21/2012  Job:  [id number]

## 2012-12-22 LAB — COMPREHENSIVE METABOLIC PANEL
AST: 20 U/L (ref 0–37)
Albumin: 4 g/dL (ref 3.5–5.2)
Alkaline Phosphatase: 76 U/L (ref 39–117)
BUN: 16 mg/dL (ref 6–23)
Creatinine, Ser: 0.66 mg/dL (ref 0.50–1.10)
Glucose, Bld: 80 mg/dL (ref 70–99)
Total Bilirubin: 0.3 mg/dL (ref 0.3–1.2)

## 2012-12-24 ENCOUNTER — Encounter: Payer: Self-pay | Admitting: *Deleted

## 2012-12-31 ENCOUNTER — Ambulatory Visit: Payer: Medicare Other | Admitting: Hematology & Oncology

## 2012-12-31 ENCOUNTER — Other Ambulatory Visit: Payer: Medicare Other | Admitting: Lab

## 2013-01-05 ENCOUNTER — Ambulatory Visit: Payer: Medicare Other | Admitting: Pulmonary Disease

## 2013-01-06 ENCOUNTER — Ambulatory Visit (INDEPENDENT_AMBULATORY_CARE_PROVIDER_SITE_OTHER)
Admission: RE | Admit: 2013-01-06 | Discharge: 2013-01-06 | Disposition: A | Discharge status: Home / Self Care | Payer: Medicare Other | Source: Ambulatory Visit | Attending: Pulmonary Disease | Admitting: Pulmonary Disease

## 2013-01-06 ENCOUNTER — Encounter: Payer: Self-pay | Admitting: Pulmonary Disease

## 2013-01-06 ENCOUNTER — Ambulatory Visit (INDEPENDENT_AMBULATORY_CARE_PROVIDER_SITE_OTHER): Payer: Medicare Other | Admitting: Pulmonary Disease

## 2013-01-06 VITALS — BP 132/66 | HR 82 | Temp 97.6°F | Ht 66.5 in | Wt 138.6 lb

## 2013-01-06 DIAGNOSIS — J40 Bronchitis, not specified as acute or chronic: Secondary | ICD-10-CM | POA: Diagnosis not present

## 2013-01-06 DIAGNOSIS — J438 Other emphysema: Secondary | ICD-10-CM | POA: Diagnosis not present

## 2013-01-06 DIAGNOSIS — J449 Chronic obstructive pulmonary disease, unspecified: Secondary | ICD-10-CM | POA: Diagnosis not present

## 2013-01-06 MED ORDER — PREDNISONE 10 MG PO TABS: ORAL_TABLET | ORAL | Status: DC

## 2013-01-06 MED ORDER — TIOTROPIUM BROMIDE MONOHYDRATE 18 MCG IN CAPS: 1.0000 | ORAL_CAPSULE | Freq: Every day | RESPIRATORY_TRACT | Status: DC

## 2013-01-06 NOTE — Assessment & Plan Note (Signed)
The patient has severe COPD, but has always done better than her degree of lung disease.  She had breast cancer surgery last fall, and feels that her breathing has never been the same.  She is not having bronchitic symptoms, nor does her history suggest thromboembolic disease.  However, with her history of cancer, and her change after surgery, we need to keep this in going forward.  We also need to consider possible cardiac disease contributing to her symptoms.  For now, we'll treat her with a course of prednisone, and I have also stressed to her the importance of wearing oxygen with exertional activities on a consistent basis.  She has also been very sedentary recently, and I've encouraged her to get back to her exercise program while wearing oxygen.

## 2013-01-06 NOTE — Patient Instructions (Addendum)
You need to wear your oxygen continuously anytime you leave the house, unless you go somewhere to sit for a period of time.  Continue wearing oxygen with sleep. Will try you on 2 weeks of prednisone to see if makes a difference Would encourage you to return slowly to your exercise program.  Wear your oxygen during your workout. If you continue to have significant shortness of breath, will consider a cardiac evaluation as well.  followup with me in 4mos, but call if you do not have some improvement.

## 2013-01-06 NOTE — Progress Notes (Signed)
  Subjective:    Patient ID: [name], female    DOB: 1929-10-07, 79 y.o.   MRN: [id number]  HPI Patient comes in today for followup of her known severe COPD.  She has always done much better than her degree of airflow obstruction, but since her breast cancer surgery last fall she has never returned to baseline.  She has significant dyspnea on exertion, but is not having any significant chest congestion or purulent mucus.  She is supposed to be wearing oxygen with exertion and sleep, but she does not wear it consistently with exertion.  She has not had any pleuritic chest pain or worsening lower extremity edema.  She has not had a cardiac evaluation in 10 years, and her last chest x-ray was last fall.   Review of Systems  Constitutional: Negative for fever and unexpected weight change.  HENT: Negative for ear pain, nosebleeds, congestion, sore throat, rhinorrhea, sneezing, trouble swallowing, dental problem, postnasal drip and sinus pressure.   Eyes: Negative for redness and itching.  Respiratory: Positive for cough, shortness of breath and wheezing. Negative for chest tightness.   Cardiovascular: Negative for palpitations and leg swelling.  Gastrointestinal: Negative for nausea and vomiting.  Genitourinary: Negative for dysuria.  Musculoskeletal: Negative for joint swelling.  Skin: Negative for rash.  Neurological: Negative for headaches.  Hematological: Does not bruise/bleed easily.  Psychiatric/Behavioral: Negative for dysphoric mood. The patient is not nervous/anxious.        Objective:   Physical Exam Thin female in no acute distress Nose without purulence or discharge noted Neck without JVD or thyromegaly Chest with decreased breath sounds throughout, no active wheezing Cardiac exam with regular rate and rhythm Lower extremities with trace edema only, no cyanosis Alert and oriented, moves all 4 extremities.       Assessment & Plan:

## 2013-01-29 ENCOUNTER — Telehealth: Payer: Self-pay | Admitting: *Deleted

## 2013-01-29 NOTE — Telephone Encounter (Signed)
Form is in your green folder to review and sign for patient handicapped placard. No rush, can be done on Monday.

## 2013-02-01 NOTE — Telephone Encounter (Signed)
Pt aware that handicap placard form is filled out and is being mailed back to her home address via the enclosed envelope she mailed.

## 2013-02-04 ENCOUNTER — Encounter (INDEPENDENT_AMBULATORY_CARE_PROVIDER_SITE_OTHER): Payer: Medicare Other | Admitting: General Surgery

## 2013-03-24 ENCOUNTER — Ambulatory Visit (HOSPITAL_BASED_OUTPATIENT_CLINIC_OR_DEPARTMENT_OTHER): Payer: Medicare Other | Admitting: Hematology & Oncology

## 2013-03-24 ENCOUNTER — Other Ambulatory Visit (HOSPITAL_BASED_OUTPATIENT_CLINIC_OR_DEPARTMENT_OTHER): Payer: Medicare Other | Admitting: Lab

## 2013-03-24 VITALS — BP 139/57 | HR 82 | Temp 98.0°F | Resp 16 | Ht 66.0 in | Wt 138.0 lb

## 2013-03-24 DIAGNOSIS — C50111 Malignant neoplasm of central portion of right female breast: Secondary | ICD-10-CM

## 2013-03-24 DIAGNOSIS — C50119 Malignant neoplasm of central portion of unspecified female breast: Secondary | ICD-10-CM | POA: Diagnosis not present

## 2013-03-24 LAB — CBC WITH DIFFERENTIAL (CANCER CENTER ONLY)
BASO#: 0 10*3/uL (ref 0.0–0.2)
Eosinophils Absolute: 0.1 10*3/uL (ref 0.0–0.5)
HCT: 38 % (ref 34.8–46.6)
HGB: 12.4 g/dL (ref 11.6–15.9)
LYMPH%: 23.4 % (ref 14.0–48.0)
MCH: 30.5 pg (ref 26.0–34.0)
MCV: 93 fL (ref 81–101)
MONO#: 0.5 10*3/uL (ref 0.1–0.9)
Platelets: 218 10*3/uL (ref 145–400)
RBC: 4.07 10*6/uL (ref 3.70–5.32)
WBC: 5.7 10*3/uL (ref 3.9–10.0)

## 2013-03-24 LAB — COMPREHENSIVE METABOLIC PANEL
Albumin: 4 g/dL (ref 3.5–5.2)
BUN: 11 mg/dL (ref 6–23)
CO2: 31 mEq/L (ref 19–32)
Glucose, Bld: 70 mg/dL (ref 70–99)
Sodium: 140 mEq/L (ref 135–145)
Total Bilirubin: 0.3 mg/dL (ref 0.3–1.2)
Total Protein: 6.8 g/dL (ref 6.0–8.3)

## 2013-03-24 NOTE — Progress Notes (Signed)
This office note has been dictated.

## 2013-03-25 DIAGNOSIS — Z961 Presence of intraocular lens: Secondary | ICD-10-CM | POA: Diagnosis not present

## 2013-03-25 NOTE — Progress Notes (Signed)
CC:   Georgann Housekeeper, MD [name], MD,FCCP [name]. [name], M.D.  DIAGNOSES: 1. Ductal carcinoma in situ of the right breast. 2. 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  CURRENT THERAPY:  Observation.  INTERIM HISTORY:  Ms. Mccaskill comes in for followup.  She is really doing well.  This summer weather does bother her lungs.  She has very bad COPD.  Thankfully, she has not been hospitalized with this.  She has had no problems with cough.  She has had no nausea or vomiting. There has been no increase in fatigue.  There has been no bony pain. There has been no change in bowel or bladder habits.  PHYSICAL EXAMINATION:  General:  This is a well-developed, well- nourished white female in no obvious distress.  Vital Signs: Temperature of 98, pulse 82, respiratory rate 16, blood pressure 139/57. Weight is 138.  Head and Neck:  Normocephalic, atraumatic skull.  There are no ocular or oral lesions.  There are no palpable cervical or supraclavicular lymph nodes.  Lungs:  Clear bilaterally.  Cardiac: Regular rate and rhythm with normal S1, S2.  There are no murmurs, rubs, or bruits.  Abdomen:  Soft with good bowel sounds.  There is no palpable abdominal mass.  There is no palpable hepatosplenomegaly.  Breasts: Left breast with a well-healed lumpectomy at the 7 o'clock position.  No distinct masses noted in the left breast.  There is no left axillary adenopathy.  Right breast shows well-healed lumpectomy at the 8 o'clock position.  This is adjacent to the areola.  Some slight hyperpigmentation is noted in the right breast.  No lumps or masses noted in the right breast.  There is no right axillary adenopathy. Back:  No kyphosis.  There is no tenderness over the spine, ribs, or hips.  Extremities:  No clubbing, cyanosis, or edema.  Skin:  Some actinic and seborrheic keratoses.  Neurologic:  No focal neurological deficits.  LABORATORY STUDIES:  White cell  count is 5.7, hemoglobin 12.4, hematocrit 38, platelet count 218.  IMPRESSION:  Ms. Brackney is a very charming 79 year old white female with ductal carcinoma in situ of the right breast.  She underwent lumpectomy followed by radiation.  She completed this back in January 2014.  She declined any tamoxifen or aromatase inhibitor therapy.  We will go ahead and plan to get her back now in 4 months.  I think we can probably make it 4 months.  I do not see need for any blood work in between visits.    ______________________________ Josph Macho, M.D. PRE/MEDQ  D:  03/24/2013  T:  03/25/2013  Job:  [id number]

## 2013-03-31 ENCOUNTER — Encounter: Payer: Self-pay | Admitting: *Deleted

## 2013-04-16 DIAGNOSIS — S058X9A Other injuries of unspecified eye and orbit, initial encounter: Secondary | ICD-10-CM | POA: Diagnosis not present

## 2013-05-11 ENCOUNTER — Encounter: Payer: Self-pay | Admitting: Pulmonary Disease

## 2013-05-11 ENCOUNTER — Ambulatory Visit (INDEPENDENT_AMBULATORY_CARE_PROVIDER_SITE_OTHER): Payer: Medicare Other | Admitting: Pulmonary Disease

## 2013-05-11 VITALS — BP 116/60 | HR 101 | Temp 97.1°F | Ht 66.5 in | Wt 139.2 lb

## 2013-05-11 DIAGNOSIS — J449 Chronic obstructive pulmonary disease, unspecified: Secondary | ICD-10-CM | POA: Diagnosis not present

## 2013-05-11 DIAGNOSIS — J209 Acute bronchitis, unspecified: Secondary | ICD-10-CM | POA: Diagnosis not present

## 2013-05-11 MED ORDER — FLUTICASONE-SALMETEROL 250-50 MCG/DOSE IN AEPB: 1.0000 | INHALATION_SPRAY | Freq: Two times a day (BID) | RESPIRATORY_TRACT | Status: DC

## 2013-05-11 MED ORDER — LEVOFLOXACIN 750 MG PO TABS: 750.0000 mg | ORAL_TABLET | Freq: Every day | ORAL | Status: DC

## 2013-05-11 NOTE — Assessment & Plan Note (Signed)
The patient has been doing fairly well from a COPD standpoint prior to her acute episode.  She would like to return back to her conditioning program, but is not able to do so with her heavy portable oxygen tanks.  We'll get her referred to a medical equipment company they can arrange for a portable concentrator.  I've asked her to continue on her maintenance bronchodilator regimen.

## 2013-05-11 NOTE — Progress Notes (Signed)
  Subjective:    Patient ID: [name], female    DOB: 1930/06/25, 79 y.o.   MRN: [id number]  HPI Patient comes in today for followup but has acute symptoms.  She has known COPD, and gives a 3 to four-day history of increasing chest congestion with cough productive of purulent mucus.  She has some increased shortness of breath, but it is not overly significant at this time.  She is also complaining about the weight of her portable oxygen, and will need something lighter in order to return to her exercise program.   Review of Systems  Constitutional: Negative for fever and unexpected weight change.  HENT: Negative for ear pain, nosebleeds, congestion, sore throat, rhinorrhea, sneezing, trouble swallowing, dental problem, postnasal drip and sinus pressure.   Eyes: Negative for redness and itching.  Respiratory: Positive for cough and shortness of breath. Negative for chest tightness and wheezing.   Cardiovascular: Negative for palpitations and leg swelling.  Gastrointestinal: Negative for nausea and vomiting.  Genitourinary: Negative for dysuria.  Musculoskeletal: Negative for joint swelling.  Skin: Negative for rash.  Neurological: Positive for headaches.  Hematological: Does not bruise/bleed easily.  Psychiatric/Behavioral: Negative for dysphoric mood. The patient is not nervous/anxious.        Objective:   Physical Exam Thin female in no acute distress Nose without purulence or discharge noted Oropharynx clear Neck without lymphadenopathy or thyromegaly Chest with adequate airflow, occasional wheeze and rhonchi noted Cardiac exam with regular rate and rhythm Lower extremities without edema, no cyanosis Alert and oriented, moves all 4 extremities.       Assessment & Plan:

## 2013-05-11 NOTE — Assessment & Plan Note (Signed)
The patient is describing acute bronchitis, and will need a course of antibiotics to get her through this.  She is to let me know if she is not improving, and will consider adding a course of prednisone.

## 2013-05-11 NOTE — Patient Instructions (Addendum)
Will treat with levaquin 750mg  each day for 7 days for your bronchitis. No change in your maintenance medication. Can try mucinex one twice a day to help thin mucus Please call if you are not improving, and we may have to call in a course of prednisone. Will get you a more portable oxygen source. followup with me in 4mos.

## 2013-05-11 NOTE — Addendum Note (Signed)
Addended by: Maisie Fus on: 05/11/2013 11:31 AM   Modules accepted: Orders

## 2013-05-20 DIAGNOSIS — I10 Essential (primary) hypertension: Secondary | ICD-10-CM | POA: Diagnosis not present

## 2013-05-20 DIAGNOSIS — E785 Hyperlipidemia, unspecified: Secondary | ICD-10-CM | POA: Diagnosis not present

## 2013-05-26 DIAGNOSIS — E782 Mixed hyperlipidemia: Secondary | ICD-10-CM | POA: Diagnosis not present

## 2013-05-26 DIAGNOSIS — Z23 Encounter for immunization: Secondary | ICD-10-CM | POA: Diagnosis not present

## 2013-05-26 DIAGNOSIS — J449 Chronic obstructive pulmonary disease, unspecified: Secondary | ICD-10-CM | POA: Diagnosis not present

## 2013-05-26 DIAGNOSIS — I10 Essential (primary) hypertension: Secondary | ICD-10-CM | POA: Diagnosis not present

## 2013-05-26 DIAGNOSIS — Z1331 Encounter for screening for depression: Secondary | ICD-10-CM | POA: Diagnosis not present

## 2013-05-26 DIAGNOSIS — Z0000 Encounter for general adult medical examination without abnormal findings: Secondary | ICD-10-CM | POA: Diagnosis not present

## 2013-05-26 DIAGNOSIS — C50919 Malignant neoplasm of unspecified site of unspecified female breast: Secondary | ICD-10-CM | POA: Diagnosis not present

## 2013-05-26 DIAGNOSIS — K219 Gastro-esophageal reflux disease without esophagitis: Secondary | ICD-10-CM | POA: Diagnosis not present

## 2013-05-26 DIAGNOSIS — M810 Age-related osteoporosis without current pathological fracture: Secondary | ICD-10-CM | POA: Diagnosis not present

## 2013-06-23 ENCOUNTER — Ambulatory Visit (INDEPENDENT_AMBULATORY_CARE_PROVIDER_SITE_OTHER): Payer: Medicare Other

## 2013-06-23 DIAGNOSIS — Z23 Encounter for immunization: Secondary | ICD-10-CM

## 2013-07-27 ENCOUNTER — Ambulatory Visit (HOSPITAL_BASED_OUTPATIENT_CLINIC_OR_DEPARTMENT_OTHER): Payer: Medicare Other | Admitting: Hematology & Oncology

## 2013-07-27 ENCOUNTER — Other Ambulatory Visit (HOSPITAL_BASED_OUTPATIENT_CLINIC_OR_DEPARTMENT_OTHER): Payer: Medicare Other | Admitting: Lab

## 2013-07-27 VITALS — BP 148/58 | HR 86 | Temp 98.0°F | Resp 16 | Ht 66.0 in | Wt 139.0 lb

## 2013-07-27 DIAGNOSIS — C50111 Malignant neoplasm of central portion of right female breast: Secondary | ICD-10-CM

## 2013-07-27 DIAGNOSIS — Z853 Personal history of malignant neoplasm of breast: Secondary | ICD-10-CM

## 2013-07-27 DIAGNOSIS — C50911 Malignant neoplasm of unspecified site of right female breast: Secondary | ICD-10-CM

## 2013-07-27 DIAGNOSIS — C50119 Malignant neoplasm of central portion of unspecified female breast: Secondary | ICD-10-CM | POA: Diagnosis not present

## 2013-07-27 LAB — CBC WITH DIFFERENTIAL (CANCER CENTER ONLY)
Eosinophils Absolute: 0.1 10*3/uL (ref 0.0–0.5)
HGB: 12.5 g/dL (ref 11.6–15.9)
MCV: 91 fL (ref 81–101)
MONO#: 0.5 10*3/uL (ref 0.1–0.9)
NEUT#: 5.7 10*3/uL (ref 1.5–6.5)
Platelets: 200 10*3/uL (ref 145–400)
RBC: 4.21 10*6/uL (ref 3.70–5.32)
WBC: 7.7 10*3/uL (ref 3.9–10.0)

## 2013-07-27 LAB — COMPREHENSIVE METABOLIC PANEL
Albumin: 3.7 g/dL (ref 3.5–5.2)
CO2: 31 mEq/L (ref 19–32)
Calcium: 9.1 mg/dL (ref 8.4–10.5)
Glucose, Bld: 94 mg/dL (ref 70–99)
Potassium: 4.4 mEq/L (ref 3.5–5.3)
Sodium: 139 mEq/L (ref 135–145)
Total Protein: 6.2 g/dL (ref 6.0–8.3)

## 2013-07-27 NOTE — Progress Notes (Signed)
This office note has been dictated.

## 2013-07-28 NOTE — Progress Notes (Signed)
CC:   Erika Share, MD,FCCP [name]. [name], M.D. Georgann Housekeeper, MD  DIAGNOSES: 1. Ductal carcinoma in situ of the right breast. 2. 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 3. Severe chronic obstructive pulmonary disease.  CURRENT THERAPY:  Observation.  INTERIM HISTORY:  [name] comes in for followup.  She is doing fairly well.  She has had no problems with her lungs since I last saw her.  She watches the weather closely.  She has had no problems with change in bowel or bladder habits.  She has had no nausea or vomiting.  There has been no fever, sweats, or chills. She has had no issues with swelling.  There has been no change with her medications.  PHYSICAL EXAMINATION:  General:  This is a thin, elderly white female in no obvious distress.  Vital Signs:  Temperature of 98, pulse 86, respiratory rate 16, blood pressure 148/58.  Weight is 139 pounds. HEENT:  Head and neck exam shows a normocephalic, atraumatic skull. There are no ocular or oral lesions.  There are no palpable cervical or supraclavicular lymph nodes.  Lungs:  Clear bilaterally.  Cardiac: Regular rate and rhythm with a normal S1 and S2.  There are no murmurs, rubs or bruits.  Abdomen:  Soft.  She has good bowel sounds.  There is no fluid wave.  There is no palpable abdominal mass.  There is no palpable hepatosplenomegaly.  Breasts:  Left breast with a well-healed lumpectomy at about the 7 o'clock position.  There is no mass in the left breast.  There is no left axillary adenopathy.  Right breast is slightly contracted from radiation.  She has a well-healed lumpectomy scar in the right breast at the 8 o'clock position.  There is some slight crusting over the areola.  There is no right axillary adenopathy. Extremities:  Some age-related osteoarthritic changes.  She has good strength in her legs.  Skin:  No rashes, ecchymosis, or petechia.  She has some seborrheic keratoses.   Neurological:  No focal neurological deficit.  LABORATORY STUDIES:  White cell count 7.7, hemoglobin 12.5, hematocrit 38.5, platelet count 200,000.  IMPRESSION:  [name] is a nice 79 year old white female with history of ductal carcinoma in situ of the right breast.  She is doing well with this.  She completed radiation therapy back in January 2014.  She did not want to go on any adjuvant hormonal therapy.  We will go ahead and plan for 4 months followup.  We will plan to get her through the holidays.    ______________________________ Josph Macho, M.D. PRE/MEDQ  D:  07/27/2013  T:  07/28/2013  Job:  [id number]

## 2013-07-29 ENCOUNTER — Encounter: Payer: Self-pay | Admitting: *Deleted

## 2013-09-07 ENCOUNTER — Ambulatory Visit (INDEPENDENT_AMBULATORY_CARE_PROVIDER_SITE_OTHER): Payer: Medicare Other | Admitting: Pulmonary Disease

## 2013-09-07 ENCOUNTER — Encounter: Payer: Self-pay | Admitting: Pulmonary Disease

## 2013-09-07 VITALS — BP 124/62 | HR 86 | Ht 65.5 in | Wt 139.8 lb

## 2013-09-07 DIAGNOSIS — J449 Chronic obstructive pulmonary disease, unspecified: Secondary | ICD-10-CM | POA: Diagnosis not present

## 2013-09-07 NOTE — Assessment & Plan Note (Signed)
The patient appears to be stable from a COPD standpoint. She has not had an acute exacerbation nor increased symptoms since her last visit. We do need to get her a portable concentrator so that she can stay on oxygen more consistently with exercise and her daily activities. We'll get her to the home care company again. I have asked her to continue on her current COPD meds.

## 2013-09-07 NOTE — Progress Notes (Signed)
   Subjective:    Patient ID: [name], female    DOB: [birth year], 79 y.o.   MRN: [id number]  HPI The patient comes in today for followup of her known severe COPD. She has done fairly well since the last visit, with no acute exacerbation. She has had some cough with chronic mucus production that is minimally discolored. She has not had symptoms consistent with acute bronchitis. She feels her exertional tolerance is at baseline. Unfortunately, she has yet to receive her portable concentrator despite the order being placed in August of this year.   Review of Systems  Constitutional: Negative for fever and unexpected weight change.  HENT: Positive for congestion and nosebleeds. Negative for dental problem, ear pain, postnasal drip, rhinorrhea, sinus pressure, sneezing, sore throat and trouble swallowing.   Eyes: Negative for redness and itching.  Respiratory: Positive for shortness of breath. Negative for cough, chest tightness and wheezing.   Cardiovascular: Negative for palpitations and leg swelling.  Gastrointestinal: Negative for nausea and vomiting.  Genitourinary: Negative for dysuria.  Musculoskeletal: Positive for joint swelling.  Skin: Negative for rash.  Neurological: Negative for headaches.  Hematological: Bruises/bleeds easily.  Psychiatric/Behavioral: Negative for dysphoric mood. The patient is not nervous/anxious.        Objective:   Physical Exam Thin female in no acute distress Nose without purulence or discharge noted Neck without lymphadenopathy or thyromegaly Chest with decreased breath sounds, a few rhonchi, no crackles or wheezes Cardiac exam with regular rate and rhythm Lower extremities without significant edema, no cyanosis Alert and oriented, moves all 4 extremities.        Assessment & Plan:

## 2013-09-07 NOTE — Patient Instructions (Signed)
Will add a humidity bottle to your oxygen at home. Will see what the hold up is for your portable concentrator No change in medications.  Let me know if you have increased issues followup with me again in 4mos.

## 2013-09-10 ENCOUNTER — Ambulatory Visit (INDEPENDENT_AMBULATORY_CARE_PROVIDER_SITE_OTHER): Payer: Medicare Other | Admitting: Internal Medicine

## 2013-09-10 ENCOUNTER — Encounter: Payer: Self-pay | Admitting: Internal Medicine

## 2013-09-10 VITALS — BP 122/68 | HR 98 | Temp 97.9°F | Ht 65.5 in | Wt 141.0 lb

## 2013-09-10 DIAGNOSIS — M1990 Unspecified osteoarthritis, unspecified site: Secondary | ICD-10-CM | POA: Insufficient documentation

## 2013-09-10 DIAGNOSIS — Z9981 Dependence on supplemental oxygen: Secondary | ICD-10-CM | POA: Insufficient documentation

## 2013-09-10 DIAGNOSIS — J449 Chronic obstructive pulmonary disease, unspecified: Secondary | ICD-10-CM

## 2013-09-10 DIAGNOSIS — E785 Hyperlipidemia, unspecified: Secondary | ICD-10-CM

## 2013-09-10 DIAGNOSIS — J439 Emphysema, unspecified: Secondary | ICD-10-CM | POA: Insufficient documentation

## 2013-09-10 DIAGNOSIS — F419 Anxiety disorder, unspecified: Secondary | ICD-10-CM | POA: Insufficient documentation

## 2013-09-10 DIAGNOSIS — J45909 Unspecified asthma, uncomplicated: Secondary | ICD-10-CM

## 2013-09-10 DIAGNOSIS — Z23 Encounter for immunization: Secondary | ICD-10-CM | POA: Diagnosis not present

## 2013-09-10 DIAGNOSIS — I10 Essential (primary) hypertension: Secondary | ICD-10-CM

## 2013-09-10 DIAGNOSIS — Z0000 Encounter for general adult medical examination without abnormal findings: Secondary | ICD-10-CM | POA: Insufficient documentation

## 2013-09-10 DIAGNOSIS — Z923 Personal history of irradiation: Secondary | ICD-10-CM | POA: Insufficient documentation

## 2013-09-10 DIAGNOSIS — J309 Allergic rhinitis, unspecified: Secondary | ICD-10-CM | POA: Insufficient documentation

## 2013-09-10 DIAGNOSIS — F329 Major depressive disorder, single episode, unspecified: Secondary | ICD-10-CM | POA: Insufficient documentation

## 2013-09-10 NOTE — Progress Notes (Signed)
Pre-visit discussion using our clinic review tool. No additional management support is needed unless otherwise documented below in the visit note.  

## 2013-09-10 NOTE — Progress Notes (Signed)
Subjective:    Patient ID: [name], female    DOB: 1930/05/09, 79 y.o.   MRN: [id number]  HPI  Here to f/u; overall doing ok,  Pt denies chest pain, increased sob or doe, wheezing, orthopnea, PND, increased LE swelling, palpitations, dizziness or syncope.  Pt denies polydipsia, polyuria, or low sugar symptoms such as weakness or confusion improved with po intake.  Pt denies new neurological symptoms such as new headache, or facial or extremity weakness or numbness.   Pt states overall good compliance with meds, has been trying to follow lower cholesterol, diabetic diet, with wt overall stable,  but little exercise however.  Declines colonoscopy No acute complaints Past Medical History  Diagnosis Date  . Other emphysema   . Unspecified essential hypertension   . Osteoporosis, unspecified   . Other and unspecified hyperlipidemia   . Esophageal reflux   . Unspecified asthma(493.90)   . Cancer   . Arthritis   . Emphysema of lung   . PONV (postoperative nausea and vomiting)   . On home oxygen therapy     at night  . Full dentures   . Wears glasses   . History of radiation therapy 07/01/06-08/13/06    left breast  total dose 6100 cGy  . Breast cancer 04/11/06 bx    left breast invasive cductal ca  . 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 Anxiety   . Depression   . Emphysema   . Cataract     extraction  . Allergy   . Hx of radiation therapy 08/31/12-09/28/12    right breast    Past Surgical History  Procedure Laterality Date  . Partial hysterectomy    . Cataract extraction    . Hip surgery  1993    left  . Breast surgery    . Cyst on face  6 months ago  . Mass excision  02/18/2012    Procedure: MINOR EXCISION OF MASS;  Surgeon: Ernestene Mention, MD;  Location: [facility];  Service: General;  Laterality: Left;  excise 2.5 cm. mass left chest wall  . Mastectomy  05/14/06    lt  lumpectomy-node dissDr.Leone  . Breast lumpectomy  07/13/12    right -DCIS 7 o'clock stage Tis,Nx  . Abdominal hysterectomy      partial    reports that she quit smoking about 17 years ago. Her smoking use included Cigarettes. She has a 60 pack-year smoking history. She has never used smokeless tobacco. She reports that she does not drink alcohol or use illicit drugs. family history includes Breast cancer in her sister; COPD in an other family member; Cancer in her father and sister; Coronary artery disease in her mother; Diabetes in her father; Kidney cancer in her brother. Allergies  Allergen Reactions  . Neosporin [Neomycin-Polymyxin B Gu] Itching and Rash  . Codeine Nausea Only  . Irbesartan Hives    Avapro  . Adhesive [Tape] Itching and Rash    To paper tape only. Rash and itching only where applied.   Current Outpatient Prescriptions on File Prior to Visit  Medication Sig Dispense Refill  . albuterol (PROVENTIL HFA) 108 (90 BASE) MCG/ACT inhaler Inhale 2 puffs into the lungs every 6 (six) hours as needed.  1 Inhaler  0  . albuterol (PROVENTIL) (2.5 MG/3ML) 0.083% nebulizer solution Take 2.5 mg by nebulization every 6 (six) hours as needed.        Marland Kitchen  ALPRAZolam (XANAX) 0.5 MG tablet Take 0.5 mg by mouth as needed.       Marland Kitchen amLODipine (NORVASC) 5 MG tablet Take 5 mg by mouth daily.        Marland Kitchen aspirin 81 MG tablet Take 81 mg by mouth daily.        Marland Kitchen atorvastatin (LIPITOR) 10 MG tablet Take 10 mg by mouth daily.        . Cholecalciferol (VITAMIN D) 2000 UNITS tablet Take 2,000 Units by mouth daily.      . Fluticasone-Salmeterol (ADVAIR DISKUS) 250-50 MCG/DOSE AEPB Inhale 1 puff into the lungs every 12 (twelve) hours.  180 each  4  . Multiple Vitamin (MULTI-VITAMIN DAILY PO) Take by mouth.      Marland Kitchen omeprazole (PRILOSEC) 20 MG capsule Take 20 mg by mouth daily.        . ramipril (ALTACE) 10 MG capsule Take 10 mg by mouth daily.        Marland Kitchen tiotropium (SPIRIVA HANDIHALER) 18 MCG inhalation  capsule Place 1 capsule (18 mcg total) into inhaler and inhale daily.  90 capsule  4  . levofloxacin (LEVAQUIN) 750 MG tablet Take 750 mg by mouth as needed.       No current facility-administered medications on file prior to visit.    Review of Systems  Constitutional: Negative for unexpected weight change, or unusual diaphoresis  HENT: Negative for tinnitus.   Eyes: Negative for photophobia and visual disturbance.  Respiratory: Negative for choking and stridor.   Gastrointestinal: Negative for vomiting and blood in stool.  Genitourinary: Negative for hematuria and decreased urine volume.  Musculoskeletal: Negative for acute joint swelling Skin: Negative for color change and wound.  Neurological: Negative for tremors and numbness other than noted  Psychiatric/Behavioral: Negative for decreased concentration or  hyperactivity.       Objective:   Physical Exam BP 122/68  Pulse 98  Temp(Src) 97.9 F (36.6 C) (Oral)  Ht 5' 5.5" (1.664 m)  Wt 141 lb (63.957 kg)  BMI 23.10 kg/m2  SpO2 93% VS noted,  Constitutional: Pt appears well-developed and well-nourished.  HENT: Head: NCAT.  Right Ear: External ear normal.  Left Ear: External ear normal.  Eyes: Conjunctivae and EOM are normal. Pupils are equal, round, and reactive to light.  Neck: Normal range of motion. Neck supple.  Cardiovascular: Normal rate and regular rhythm.   Pulmonary/Chest: Effort normal and breath sounds normal.  Abd:  Soft, NT, non-distended, + BS Neurological: Pt is alert. Not confused  Skin: Skin is warm. No erythema.  Psychiatric: Pt behavior is normal. Thought content normal.     Assessment & Plan:

## 2013-09-10 NOTE — Patient Instructions (Addendum)
You had the new Prevnar pneumonia shot Please continue all other medications as before, and refills have been done if requested. Please have the pharmacy call with any other refills you may need.  Please continue your efforts at being more active, low cholesterol diet, and weight control. You are otherwise up to date with prevention measures today.  No lab work needed today  Please sign a release of information form to get records from your previous MD  Please keep your appointments with your specialists as you have planned- Dr Shelle Iron, and Dr Myna Hidalgo  Please return in 1 year for your yearly visit, or sooner if needed

## 2013-09-12 NOTE — Assessment & Plan Note (Signed)
stable overall by history and exam, and pt to continue medical treatment as before,  to f/u any worsening symptoms or concerns 

## 2013-09-12 NOTE — Assessment & Plan Note (Signed)
stable overall by history and exam, recent data reviewed with pt, and pt to continue medical treatment as before,  to f/u any worsening symptoms or concerns Lab Results  Component Value Date   LDLCALC 73 08/04/2007

## 2013-09-12 NOTE — Assessment & Plan Note (Signed)
stable overall by history and exam, recent data reviewed with pt, and pt to continue medical treatment as before,  to f/u any worsening symptoms or concerns BP Readings from Last 3 Encounters:  09/10/13 122/68  09/07/13 124/62  07/27/13 148/58

## 2013-09-12 NOTE — Assessment & Plan Note (Signed)
stable overall by history and exam, recent data reviewed with pt, and pt to continue medical treatment as before,  to f/u any worsening symptoms or concerns SpO2 Readings from Last 3 Encounters:  09/10/13 93%  09/07/13 92%  05/11/13 92%

## 2013-11-23 ENCOUNTER — Other Ambulatory Visit (HOSPITAL_BASED_OUTPATIENT_CLINIC_OR_DEPARTMENT_OTHER): Payer: Medicare Other | Admitting: Lab

## 2013-11-23 ENCOUNTER — Ambulatory Visit (HOSPITAL_BASED_OUTPATIENT_CLINIC_OR_DEPARTMENT_OTHER): Payer: Medicare Other | Admitting: Hematology & Oncology

## 2013-11-23 ENCOUNTER — Encounter: Payer: Self-pay | Admitting: Hematology & Oncology

## 2013-11-23 VITALS — BP 124/53 | HR 87 | Temp 98.0°F | Resp 14 | Ht 65.0 in | Wt 141.0 lb

## 2013-11-23 DIAGNOSIS — J449 Chronic obstructive pulmonary disease, unspecified: Secondary | ICD-10-CM | POA: Diagnosis not present

## 2013-11-23 DIAGNOSIS — C50119 Malignant neoplasm of central portion of unspecified female breast: Secondary | ICD-10-CM | POA: Diagnosis not present

## 2013-11-23 DIAGNOSIS — C50911 Malignant neoplasm of unspecified site of right female breast: Secondary | ICD-10-CM

## 2013-11-23 LAB — CBC WITH DIFFERENTIAL (CANCER CENTER ONLY)
BASO#: 0 10*3/uL (ref 0.0–0.2)
BASO%: 0.1 % (ref 0.0–2.0)
EOS%: 1.3 % (ref 0.0–7.0)
Eosinophils Absolute: 0.1 10*3/uL (ref 0.0–0.5)
HEMATOCRIT: 37.7 % (ref 34.8–46.6)
HEMOGLOBIN: 12.2 g/dL (ref 11.6–15.9)
LYMPH#: 1.2 10*3/uL (ref 0.9–3.3)
LYMPH%: 14 % (ref 14.0–48.0)
MCH: 29.8 pg (ref 26.0–34.0)
MCHC: 32.4 g/dL (ref 32.0–36.0)
MCV: 92 fL (ref 81–101)
MONO#: 0.5 10*3/uL (ref 0.1–0.9)
MONO%: 5.7 % (ref 0.0–13.0)
NEUT#: 6.7 10*3/uL — ABNORMAL HIGH (ref 1.5–6.5)
NEUT%: 78.9 % (ref 39.6–80.0)
Platelets: 201 10*3/uL (ref 145–400)
RBC: 4.1 10*6/uL (ref 3.70–5.32)
RDW: 13 % (ref 11.1–15.7)
WBC: 8.4 10*3/uL (ref 3.9–10.0)

## 2013-11-23 LAB — CMP (CANCER CENTER ONLY)
ALT: 15 U/L (ref 10–47)
AST: 14 U/L (ref 11–38)
Albumin: 3.3 g/dL (ref 3.3–5.5)
Alkaline Phosphatase: 82 U/L (ref 26–84)
BILIRUBIN TOTAL: 0.6 mg/dL (ref 0.20–1.60)
BUN: 13 mg/dL (ref 7–22)
CALCIUM: 9 mg/dL (ref 8.0–10.3)
CHLORIDE: 101 meq/L (ref 98–108)
CO2: 33 meq/L (ref 18–33)
CREATININE: 0.5 mg/dL — AB (ref 0.6–1.2)
Glucose, Bld: 93 mg/dL (ref 73–118)
Potassium: 3.8 mEq/L (ref 3.3–4.7)
Sodium: 142 mEq/L (ref 128–145)
Total Protein: 7 g/dL (ref 6.4–8.1)

## 2013-11-23 NOTE — Progress Notes (Signed)
Hematology and Oncology Follow Up Visit  [name] [id number] [birth year] 80 y.o. 11/23/2013   Principle Diagnosis:   Ductal carcinoma in situ of the right breast  Stage I (T1bN0M0) infiltrating duct carcinoma of the left breast  Severe COPD  Current Therapy:   Observation     Interim History:  [name] is back for followup. These are back in November. She's been doing okay. Her main problem all her lungs. She really cannot do too much. Thankfully, the Ernie Hew was not too bad for her.  She's had no problems with pneumonia. She's had no issues with nausea vomiting. There's been no change in bowel or bladder habits. She's had no leg swelling. She's had no rashes.  Medications: Current outpatient prescriptions:albuterol (PROVENTIL HFA) 108 (90 BASE) MCG/ACT inhaler, Inhale 2 puffs into the lungs every 6 (six) hours as needed., Disp: 1 Inhaler, Rfl: 0;  albuterol (PROVENTIL) (2.5 MG/3ML) 0.083% nebulizer solution, Take 2.5 mg by nebulization every 6 (six) hours as needed.  , Disp: , Rfl: ;  ALPRAZolam (XANAX) 0.5 MG tablet, Take 0.5 mg by mouth as needed. , Disp: , Rfl:  amLODipine (NORVASC) 5 MG tablet, Take 5 mg by mouth daily.  , Disp: , Rfl: ;  aspirin 81 MG tablet, Take 81 mg by mouth daily.  , Disp: , Rfl: ;  atorvastatin (LIPITOR) 10 MG tablet, Take 10 mg by mouth daily.  , Disp: , Rfl: ;  Cholecalciferol (VITAMIN D) 2000 UNITS tablet, Take 2,000 Units by mouth daily., Disp: , Rfl:  Fluticasone-Salmeterol (ADVAIR DISKUS) 250-50 MCG/DOSE AEPB, Inhale 1 puff into the lungs every 12 (twelve) hours., Disp: 180 each, Rfl: 4;  levofloxacin (LEVAQUIN) 750 MG tablet, Take 750 mg by mouth as needed., Disp: , Rfl: ;  Multiple Vitamin (MULTI-VITAMIN DAILY PO), Take by mouth., Disp: , Rfl: ;  omeprazole (PRILOSEC) 20 MG capsule, Take 20 mg by mouth daily.  , Disp: , Rfl:  ramipril (ALTACE) 10 MG capsule, Take 10 mg by mouth daily.  , Disp: , Rfl: ;  tiotropium (SPIRIVA HANDIHALER) 18 MCG inhalation  capsule, Place 1 capsule (18 mcg total) into inhaler and inhale daily., Disp: 90 capsule, Rfl: 4  Allergies:  Allergies  Allergen Reactions  . Neosporin [Neomycin-Polymyxin B Gu] Itching and Rash  . Codeine Nausea Only  . Irbesartan Hives    Avapro  . Adhesive [Tape] Itching and Rash    To paper tape only. Rash and itching only where applied.    Past Medical History, Surgical history, Social history, and Family History were reviewed and updated.  Review of Systems: As above  Physical Exam:  height is 5\' 5"  (1.651 m) and weight is 141 lb (63.957 kg). Her oral temperature is 98 F (36.7 C). Her blood pressure is 124/53 and her pulse is 87. Her respiration is 14 and oxygen saturation is 96%.   Elderly white female in no obvious distress. Her head and neck exam shows no adenopathy in the neck. She has no ocular or oral lesions. Lungs show wheezes on the right side. She has good air movement. Cardiac exam regular in rhythm with no murmurs rubs or bruits. Breast exam shows left breast with lumpectomy of the 7:00 position. No distinct mass is noted in the left breast. There is no left axillary adenopathy. Right breast shows a well-healed lumpectomy at the 8:00 position. The some slight contraction of the right breast from radiation. No mass is noted. There is no right axillary adenopathy. Abdomen  is soft. She has good bowel sounds. There is no fluid wave. There is no palpable hepato- splenomegaly. Back exam shows scoliosis. No tenderness is noted over the spine. Extremities shows no clubbing cyanosis or edema. She has age related arthritic changes. Skin exam shows numerous hyperpigmented lesions. I do not see anything that is suspicious.  Lab Results  Component Value Date   WBC 8.4 11/23/2013   HGB 12.2 11/23/2013   HCT 37.7 11/23/2013   MCV 92 11/23/2013   PLT 201 11/23/2013     Chemistry      Component Value Date/Time   NA 142 11/23/2013 1001   NA 139 07/27/2013 0957   K 3.8 11/23/2013 1001   K 4.4  07/27/2013 0957   CL 101 11/23/2013 1001   CL 103 07/27/2013 0957   CO2 33 11/23/2013 1001   CO2 31 07/27/2013 0957   BUN 13 11/23/2013 1001   BUN 14 07/27/2013 0957   CREATININE 0.5* 11/23/2013 1001   CREATININE 0.69 07/27/2013 0957      Component Value Date/Time   CALCIUM 9.0 11/23/2013 1001   CALCIUM 9.1 07/27/2013 0957   ALKPHOS 82 11/23/2013 1001   ALKPHOS 77 07/27/2013 0957   AST 14 11/23/2013 1001   AST 18 07/27/2013 0957   ALT 15 11/23/2013 1001   ALT 13 07/27/2013 0957   BILITOT 0.60 11/23/2013 1001   BILITOT 0.4 07/27/2013 0957         Impression and Plan: [name] is 80 year old white female. She has past history of DCIS of the right breast. She has a stage I carcinoma of the left breast. She completed radiation to the right breast in January 2014. The left breast cancer is in remission now over 10 years.  Her prognosis is clearly reflective of her COPD.  We will plan to get her back now in the fall.   Volanda Napoleon, MD 3/3/201511:10 AM

## 2014-01-13 ENCOUNTER — Encounter: Payer: Self-pay | Admitting: Pulmonary Disease

## 2014-01-13 ENCOUNTER — Ambulatory Visit (INDEPENDENT_AMBULATORY_CARE_PROVIDER_SITE_OTHER): Payer: Medicare Other | Admitting: Pulmonary Disease

## 2014-01-13 VITALS — BP 110/70 | HR 73 | Temp 97.4°F | Ht 65.5 in | Wt 142.8 lb

## 2014-01-13 DIAGNOSIS — J449 Chronic obstructive pulmonary disease, unspecified: Secondary | ICD-10-CM

## 2014-01-13 DIAGNOSIS — J4489 Other specified chronic obstructive pulmonary disease: Secondary | ICD-10-CM

## 2014-01-13 NOTE — Assessment & Plan Note (Signed)
The patient appears to be stable from a COPD standpoint on her current bronchodilator regimen. I've asked her to try and wear her oxygen more compliantly with exertion, and to stay on her meds. I've asked her to stay as active as possible.

## 2014-01-13 NOTE — Progress Notes (Signed)
   Subjective:    Patient ID: [name], female    DOB: 02-13-1930, 80 y.o.   MRN: [id number]  HPI Patient comes in today for followup of her known COPD. She is staying on her medications compliantly, and feels that her exertional tolerance is at baseline. She denies any significant cough, but does produce white mucus at times. She is staying as active as possible, and wears her oxygen at night.   Review of Systems  Constitutional: Negative for fever and unexpected weight change.  HENT: Negative for congestion, dental problem, ear pain, nosebleeds, postnasal drip, rhinorrhea, sinus pressure, sneezing, sore throat and trouble swallowing.   Eyes: Negative for redness and itching.  Respiratory: Negative for cough, chest tightness, shortness of breath and wheezing.        Coughing up mucus--white  Cardiovascular: Negative for palpitations and leg swelling.  Gastrointestinal: Negative for nausea and vomiting.  Genitourinary: Negative for dysuria.  Musculoskeletal: Negative for joint swelling.  Skin: Negative for rash.  Neurological: Negative for headaches.  Hematological: Does not bruise/bleed easily.  Psychiatric/Behavioral: Negative for dysphoric mood. The patient is not nervous/anxious.        Objective:   Physical Exam Thin female in no acute distress Nose without purulence or discharge noted Neck without lymphadenopathy or thyromegaly Chest with decreased breath sounds, no active wheezing or rhonchi Cardiac exam with regular rate and rhythm Lower extremities without edema, no cyanosis Alert and oriented, moves all 4 extremities.       Assessment & Plan:

## 2014-01-13 NOTE — Patient Instructions (Signed)
No change in medications. Stay as active as possible. followup with me again in 1mos.

## 2014-04-23 ENCOUNTER — Other Ambulatory Visit: Payer: Self-pay | Admitting: Pulmonary Disease

## 2014-05-02 ENCOUNTER — Telehealth: Payer: Self-pay | Admitting: Internal Medicine

## 2014-05-02 MED ORDER — RAMIPRIL 10 MG PO CAPS: 10.0000 mg | ORAL_CAPSULE | Freq: Every day | ORAL | Status: DC

## 2014-05-02 MED ORDER — ATORVASTATIN CALCIUM 10 MG PO TABS: 10.0000 mg | ORAL_TABLET | Freq: Every day | ORAL | Status: DC

## 2014-05-02 MED ORDER — AMLODIPINE BESYLATE 5 MG PO TABS: 5.0000 mg | ORAL_TABLET | Freq: Every day | ORAL | Status: DC

## 2014-05-02 NOTE — Telephone Encounter (Signed)
90 days supply. Ok to leave detail massage if no answer

## 2014-05-02 NOTE — Telephone Encounter (Signed)
[facility]

## 2014-05-02 NOTE — Telephone Encounter (Signed)
Pt request new Rx for Alorvastatin, Ramipril and Amlodipine [facility] [address]. Please advise.

## 2014-05-19 ENCOUNTER — Ambulatory Visit: Payer: Medicare Other | Admitting: Pulmonary Disease

## 2014-05-24 ENCOUNTER — Other Ambulatory Visit: Payer: Self-pay | Admitting: Pulmonary Disease

## 2014-05-25 ENCOUNTER — Encounter: Payer: Self-pay | Admitting: Pulmonary Disease

## 2014-05-25 ENCOUNTER — Ambulatory Visit (INDEPENDENT_AMBULATORY_CARE_PROVIDER_SITE_OTHER): Payer: Medicare Other | Admitting: Pulmonary Disease

## 2014-05-25 ENCOUNTER — Telehealth: Payer: Self-pay | Admitting: *Deleted

## 2014-05-25 VITALS — BP 110/60 | HR 79 | Temp 97.7°F | Ht 66.0 in | Wt 137.6 lb

## 2014-05-25 DIAGNOSIS — J439 Emphysema, unspecified: Secondary | ICD-10-CM

## 2014-05-25 DIAGNOSIS — J438 Other emphysema: Secondary | ICD-10-CM | POA: Diagnosis not present

## 2014-05-25 DIAGNOSIS — J441 Chronic obstructive pulmonary disease with (acute) exacerbation: Secondary | ICD-10-CM | POA: Diagnosis not present

## 2014-05-25 MED ORDER — LEVOFLOXACIN 750 MG PO TABS: 750.0000 mg | ORAL_TABLET | Freq: Every day | ORAL | Status: DC

## 2014-05-25 MED ORDER — PREDNISONE 10 MG PO TABS
ORAL_TABLET | ORAL | Status: DC
Start: 2014-05-25 — End: 2014-09-29

## 2014-05-25 MED ORDER — HYDROCODONE-HOMATROPINE 5-1.5 MG/5ML PO SYRP
5.0000 mL | ORAL_SOLUTION | Freq: Four times a day (QID) | ORAL | Status: DC | PRN
Start: 2014-05-25 — End: 2015-11-10

## 2014-05-25 NOTE — Assessment & Plan Note (Signed)
The patient is describing increased chest congestion with purulence as well as worsening wheezing and shortness of breath that is most consistent with a COPD exacerbation. She will need a course of steroids, as well as an antibiotic. She also has been noting increased cough over the last year, and it is noted that she is on an ACE inhibitor

## 2014-05-25 NOTE — Telephone Encounter (Signed)
Left msg on triage stating she been coughing more than usual. Saw Dr. Gwenette Greet today and he told her to contact md to have hin to change her ramipril because that is one of the side effect. Called pt back inform her before med could be change she will need to make f/u appt. Pt states she is already schedule to see him on 06/16/14. Will wait to discuss with him them. She just got a 90 supply on med...Johny Chess

## 2014-05-25 NOTE — Patient Instructions (Signed)
Will treat you with levaquin 750mg  one a day for 5 days Prednisone taper over 8 days Use mucinex dm extra strength, one in am and pm until better.  Can use hydromet cough syrup one teaspoon every 6 hrs if needed.  (4 ounces) Please contact your primary md and discuss coming off altace (good chance it is worsening your cough). No change in your breathing medications followup with me again in 17mos.

## 2014-05-25 NOTE — Progress Notes (Signed)
   Subjective:    Patient ID: [name], female    DOB: [birth year], 80 y.o.   MRN: [id number]  HPI Patient comes in today for an acute sick visit.  She has known severe COPD, and gives a four-day history of increasing chest congestion, cough with purulent mucus, as well as wheezing with shortness of breath. She also has been having more of a chronic cough over the last year, and it is noted that she is on an ACE inhibitor.   Review of Systems  Constitutional: Negative for fever and unexpected weight change.  HENT: Negative for congestion, dental problem, ear pain, nosebleeds, postnasal drip, rhinorrhea, sinus pressure, sneezing, sore throat and trouble swallowing.   Eyes: Negative for redness and itching.  Respiratory: Positive for cough, chest tightness, shortness of breath and wheezing.   Cardiovascular: Negative for palpitations and leg swelling.  Gastrointestinal: Negative for nausea and vomiting.  Genitourinary: Negative for dysuria.  Musculoskeletal: Negative for joint swelling.  Skin: Negative for rash.  Neurological: Negative for headaches.  Hematological: Does not bruise/bleed easily.  Psychiatric/Behavioral: Negative for dysphoric mood. The patient is not nervous/anxious.        Objective:   Physical Exam Thin female in no acute distress Nose without purulence or discharge noted Neck without lymphadenopathy or thyromegaly Chest with diffuse wheezing but adequate airflow Cardiac exam with regular rate and rhythm Lower extremities without edema, no cyanosis Alert and oriented, moves all 4 extremities.       Assessment & Plan:

## 2014-06-16 ENCOUNTER — Ambulatory Visit (INDEPENDENT_AMBULATORY_CARE_PROVIDER_SITE_OTHER): Payer: Medicare Other | Admitting: Internal Medicine

## 2014-06-16 ENCOUNTER — Encounter: Payer: Self-pay | Admitting: Internal Medicine

## 2014-06-16 ENCOUNTER — Other Ambulatory Visit (INDEPENDENT_AMBULATORY_CARE_PROVIDER_SITE_OTHER): Payer: Medicare Other

## 2014-06-16 VITALS — BP 138/72 | HR 99 | Temp 97.8°F | Wt 140.0 lb

## 2014-06-16 DIAGNOSIS — E785 Hyperlipidemia, unspecified: Secondary | ICD-10-CM

## 2014-06-16 DIAGNOSIS — Z136 Encounter for screening for cardiovascular disorders: Secondary | ICD-10-CM | POA: Diagnosis not present

## 2014-06-16 DIAGNOSIS — K219 Gastro-esophageal reflux disease without esophagitis: Secondary | ICD-10-CM

## 2014-06-16 DIAGNOSIS — F3289 Other specified depressive episodes: Secondary | ICD-10-CM

## 2014-06-16 DIAGNOSIS — F329 Major depressive disorder, single episode, unspecified: Secondary | ICD-10-CM

## 2014-06-16 DIAGNOSIS — I10 Essential (primary) hypertension: Secondary | ICD-10-CM

## 2014-06-16 DIAGNOSIS — F32A Depression, unspecified: Secondary | ICD-10-CM

## 2014-06-16 DIAGNOSIS — Z23 Encounter for immunization: Secondary | ICD-10-CM | POA: Diagnosis not present

## 2014-06-16 LAB — HEPATIC FUNCTION PANEL
ALBUMIN: 4 g/dL (ref 3.5–5.2)
ALT: 13 U/L (ref 0–35)
AST: 19 U/L (ref 0–37)
Alkaline Phosphatase: 97 U/L (ref 39–117)
BILIRUBIN DIRECT: 0 mg/dL (ref 0.0–0.3)
TOTAL PROTEIN: 7.6 g/dL (ref 6.0–8.3)
Total Bilirubin: 0.5 mg/dL (ref 0.2–1.2)

## 2014-06-16 LAB — URINALYSIS, ROUTINE W REFLEX MICROSCOPIC
Bilirubin Urine: NEGATIVE
HGB URINE DIPSTICK: NEGATIVE
Ketones, ur: NEGATIVE
NITRITE: NEGATIVE
RBC / HPF: NONE SEEN (ref 0–?)
SPECIFIC GRAVITY, URINE: 1.01 (ref 1.000–1.030)
TOTAL PROTEIN, URINE-UPE24: NEGATIVE
URINE GLUCOSE: NEGATIVE
Urobilinogen, UA: 0.2 (ref 0.0–1.0)
pH: 7 (ref 5.0–8.0)

## 2014-06-16 LAB — LIPID PANEL
Cholesterol: 159 mg/dL (ref 0–200)
HDL: 57.2 mg/dL (ref >39.00)
LDL Cholesterol: 72 mg/dL (ref 0–99)
NonHDL: 101.8
TRIGLYCERIDES: 148 mg/dL (ref 0.0–149.0)
Total CHOL/HDL Ratio: 3
VLDL: 29.6 mg/dL (ref 0.0–40.0)

## 2014-06-16 LAB — CBC WITH DIFFERENTIAL/PLATELET
BASOS PCT: 0.4 % (ref 0.0–3.0)
Basophils Absolute: 0 10*3/uL (ref 0.0–0.1)
EOS ABS: 0.2 10*3/uL (ref 0.0–0.7)
Eosinophils Relative: 2.1 % (ref 0.0–5.0)
HCT: 39.3 % (ref 36.0–46.0)
HEMOGLOBIN: 13.1 g/dL (ref 12.0–15.0)
LYMPHS PCT: 17.7 % (ref 12.0–46.0)
Lymphs Abs: 1.3 10*3/uL (ref 0.7–4.0)
MCHC: 33.3 g/dL (ref 30.0–36.0)
MCV: 91.3 fl (ref 78.0–100.0)
MONOS PCT: 5.8 % (ref 3.0–12.0)
Monocytes Absolute: 0.4 10*3/uL (ref 0.1–1.0)
NEUTROS ABS: 5.5 10*3/uL (ref 1.4–7.7)
NEUTROS PCT: 74 % (ref 43.0–77.0)
Platelets: 208 10*3/uL (ref 150.0–400.0)
RBC: 4.3 Mil/uL (ref 3.87–5.11)
RDW: 14 % (ref 11.5–15.5)
WBC: 7.4 10*3/uL (ref 4.0–10.5)

## 2014-06-16 LAB — BASIC METABOLIC PANEL
BUN: 12 mg/dL (ref 6–23)
CO2: 30 meq/L (ref 19–32)
CREATININE: 0.6 mg/dL (ref 0.4–1.2)
Calcium: 9.2 mg/dL (ref 8.4–10.5)
Chloride: 100 mEq/L (ref 96–112)
GFR: 107.93 mL/min (ref >60.00)
Glucose, Bld: 97 mg/dL (ref 70–99)
Potassium: 3.9 mEq/L (ref 3.5–5.1)
Sodium: 137 mEq/L (ref 135–145)

## 2014-06-16 LAB — TSH: TSH: 0.81 u[IU]/mL (ref 0.35–4.50)

## 2014-06-16 NOTE — Progress Notes (Signed)
Subjective:    Patient ID: [name], female    DOB: [birth year], 80 y.o.   MRN: [id number]  HPI  Here for yearly f/u;  Overall doing ok;  Pt denies CP, worsening SOB, DOE, wheezing, orthopnea, PND, worsening LE edema, palpitations, dizziness or syncope.  Pt denies neurological change such as new headache, facial or extremity weakness.  Pt denies polydipsia, polyuria, or low sugar symptoms. Pt states overall good compliance with treatment and medications, good tolerability, and has been trying to follow lower cholesterol diet.  Pt denies worsening depressive symptoms, suicidal ideation or panic. No fever, night sweats, wt loss, loss of appetite, or other constitutional symptoms.  Pt states good ability with ADL's, has low fall risk, home safety reviewed and adequate, no other significant changes in hearing or vision, and only occasionally active with exercise.  Former smoker, quit x 20 yrs, prior 1.5 ppd for 30 yrs.  Declines medicare low dose CT chest screening yearly.   No current conmplaints. Cont;s to see Dr Gwenette Greet for pulm Past Medical History  Diagnosis Date  . Other emphysema   . Unspecified essential hypertension   . Osteoporosis, unspecified   . Other and unspecified hyperlipidemia   . Esophageal reflux   . Unspecified asthma(493.90)   . Cancer   . Arthritis   . Emphysema of lung   . PONV (postoperative nausea and vomiting)   . On home oxygen therapy     at night  . Full dentures   . Wears glasses   . History of radiation therapy 07/01/06-08/13/06    left breast  total dose 6100 cGy  . Breast cancer 04/11/06 bx    left breast invasive cductal ca  . 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 Anxiety   . Depression   . Emphysema   . Cataract     extraction  . Allergy   . Hx of radiation therapy 08/31/12-09/28/12    right breast    Past Surgical History  Procedure Laterality Date  . Partial hysterectomy     . Cataract extraction    . Hip surgery  1993    left  . Breast surgery    . Cyst on face  6 months ago  . Mass excision  02/18/2012    Procedure: MINOR EXCISION OF MASS;  Surgeon: Adin Hector, MD;  Location: [facility];  Service: General;  Laterality: Left;  excise 2.5 cm. mass left chest wall  . Mastectomy  05/14/06    lt lumpectomy-node dissDr.Leone  . Breast lumpectomy  07/13/12    right -DCIS 7 o'clock stage Tis,Nx  . Abdominal hysterectomy      partial    reports that she quit smoking about 18 years ago. Her smoking use included Cigarettes. She started smoking about 60 years ago. She has a 64.5 pack-year smoking history. She has never used smokeless tobacco. She reports that she does not drink alcohol or use illicit drugs. family history includes Breast cancer in her sister; COPD in an other family member; Cancer in her father and sister; Coronary artery disease in her mother; Diabetes in her father; Kidney cancer in her brother. Allergies  Allergen Reactions  . Neosporin [Neomycin-Polymyxin B Gu] Itching and Rash  . Codeine Nausea Only  . Irbesartan Hives    Avapro  . Adhesive [Tape] Itching and Rash    To paper tape only. Rash and itching only where  applied.   Current Outpatient Prescriptions on File Prior to Visit  Medication Sig Dispense Refill  . ADVAIR DISKUS 250-50 MCG/DOSE AEPB INHALE 1 PUFF INTO THE LUNGS EVERY 12 HOURS  180 each  3  . albuterol (PROVENTIL HFA) 108 (90 BASE) MCG/ACT inhaler Inhale 2 puffs into the lungs every 6 (six) hours as needed.  1 Inhaler  0  . albuterol (PROVENTIL) (2.5 MG/3ML) 0.083% nebulizer solution Take 2.5 mg by nebulization every 6 (six) hours as needed.        . ALPRAZolam (XANAX) 0.5 MG tablet Take 0.5 mg by mouth as needed.       Marland Kitchen amLODipine (NORVASC) 5 MG tablet Take 1 tablet (5 mg total) by mouth daily.  90 tablet  1  . aspirin 81 MG tablet Take 81 mg by mouth daily.        Marland Kitchen atorvastatin (LIPITOR) 10 MG tablet  Take 1 tablet (10 mg total) by mouth daily.  90 tablet  1  . Cholecalciferol (VITAMIN D) 2000 UNITS tablet Take 2,000 Units by mouth daily.      Marland Kitchen HYDROcodone-homatropine (HYCODAN) 5-1.5 MG/5ML syrup Take 5 mLs by mouth every 6 (six) hours as needed for cough.  120 mL  0  . levofloxacin (LEVAQUIN) 750 MG tablet Take 1 tablet (750 mg total) by mouth daily.  5 tablet  0  . Multiple Vitamin (MULTI-VITAMIN DAILY PO) Take by mouth.      Marland Kitchen omeprazole (PRILOSEC) 20 MG capsule Take 20 mg by mouth daily.        . predniSONE (DELTASONE) 10 MG tablet Take 4 tabs daily x 2 days, 3 tabs daily x 2, 2 tab x 2 days, then 1 tab x 2 days, then stop  20 tablet  0  . ramipril (ALTACE) 10 MG capsule Take 1 capsule (10 mg total) by mouth daily.  90 capsule  1  . SPIRIVA HANDIHALER 18 MCG inhalation capsule PLACE 1 CAPSULE (18 MCG TOTAL) INTO INHALER AND INHALE DAILY.  90 capsule  3   No current facility-administered medications on file prior to visit.   Review of Systems Constitutional: Negative for increased diaphoresis, other activity, appetite or other siginficant weight change  HENT: Negative for worsening hearing loss, ear pain, facial swelling, mouth sores and neck stiffness.   Eyes: Negative for other worsening pain, redness or visual disturbance.  Respiratory: Negative for shortness of breath and wheezing.   Cardiovascular: Negative for chest pain and palpitations.  Gastrointestinal: Negative for diarrhea, blood in stool, abdominal distention or other pain Genitourinary: Negative for hematuria, flank pain or change in urine volume.  Musculoskeletal: Negative for myalgias or other joint complaints.  Skin: Negative for color change and wound.  Neurological: Negative for syncope and numbness. other than noted Hematological: Negative for adenopathy. or other swelling Psychiatric/Behavioral: Negative for hallucinations, self-injury, decreased concentration or other worsening agitation.      Objective:    Physical Exam .vvs VS noted,  Constitutional: Pt is oriented to person, place, and time. Appears well-developed and well-nourished.  Head: Normocephalic and atraumatic.  Right Ear: External ear normal.  Left Ear: External ear normal.  Nose: Nose normal.  Mouth/Throat: Oropharynx is clear and moist.  Eyes: Conjunctivae and EOM are normal. Pupils are equal, round, and reactive to light.  Neck: Normal range of motion. Neck supple. No JVD present. No tracheal deviation present.  Cardiovascular: Normal rate, regular rhythm, normal heart sounds and intact distal pulses.   Pulmonary/Chest: Effort normal and breath sounds  without rales or wheezing , decreasd bilat Abdominal: Soft. Bowel sounds are normal. NT. No HSM  Musculoskeletal: Normal range of motion. Exhibits no edema.  Lymphadenopathy:  Has no cervical adenopathy.  Neurological: Pt is alert and oriented to person, place, and time. Pt has normal reflexes. No cranial nerve deficit. Motor grossly intact Skin: Skin is warm and dry. No rash noted.  Psychiatric:  Has normal mood and affect. Behavior is normal. not depressed affect    Assessment & Plan:

## 2014-06-16 NOTE — Assessment & Plan Note (Signed)
stable overall by history and exam, recent data reviewed with pt, and pt to continue medical treatment as before,  to f/u any worsening symptoms or concerns   Lab Results  Component Value Date   LDLCALC 73 08/04/2007   For f/u labs

## 2014-06-16 NOTE — Patient Instructions (Addendum)
You had the flu shot today   OK to finish your Altace as you just had it refilled and the cough is small;  Please call later if you would like to change the medication  Please continue all other medications as before, and refills have been done if requested.  Please have the pharmacy call with any other refills you may need.  Please continue your efforts at being more active, low cholesterol diet, and weight control.  You are otherwise up to date with prevention measures today.  Please keep your appointments with your specialists as you may have planned  Please go to the LAB in the Basement (turn left off the elevator) for the tests to be done today  You will be contacted by phone if any changes need to be made immediately.  Otherwise, you will receive a letter about your results with an explanation, but please check with MyChart first.  Please remember to sign up for MyChart if you have not done so, as this will be important to you in the future with finding out test results, communicating by private email, and scheduling acute appointments online when needed.  Please return in 1 year for your yearly visit, or sooner if needed, with Lab testing done 3-5 days before

## 2014-06-16 NOTE — Progress Notes (Signed)
Pre visit review using our clinic review tool, if applicable. No additional management support is needed unless otherwise documented below in the visit note. 

## 2014-06-16 NOTE — Assessment & Plan Note (Signed)
stable overall by history and exam, and pt to continue medical treatment as before,  to f/u any worsening symptoms or concerns 

## 2014-06-16 NOTE — Assessment & Plan Note (Signed)
stable overall by history and exam, recent data reviewed with pt, and pt to continue medical treatment as before,  to f/u any worsening symptoms or concerns BP Readings from Last 3 Encounters:  06/16/14 138/72  05/25/14 110/60  01/13/14 110/70   Does have mild cough, non prod, consider change to amlodipine 5 for persistent, pt declines at this time

## 2014-07-26 ENCOUNTER — Other Ambulatory Visit (HOSPITAL_BASED_OUTPATIENT_CLINIC_OR_DEPARTMENT_OTHER): Payer: Medicare Other | Admitting: Lab

## 2014-07-26 ENCOUNTER — Encounter: Payer: Self-pay | Admitting: Hematology & Oncology

## 2014-07-26 ENCOUNTER — Ambulatory Visit (HOSPITAL_BASED_OUTPATIENT_CLINIC_OR_DEPARTMENT_OTHER): Payer: Medicare Other | Admitting: Hematology & Oncology

## 2014-07-26 VITALS — BP 136/88 | HR 91 | Temp 97.7°F | Resp 16 | Ht 66.0 in | Wt 140.0 lb

## 2014-07-26 DIAGNOSIS — Z853 Personal history of malignant neoplasm of breast: Secondary | ICD-10-CM | POA: Diagnosis not present

## 2014-07-26 DIAGNOSIS — C50112 Malignant neoplasm of central portion of left female breast: Secondary | ICD-10-CM

## 2014-07-26 DIAGNOSIS — C50119 Malignant neoplasm of central portion of unspecified female breast: Secondary | ICD-10-CM

## 2014-07-26 DIAGNOSIS — J449 Chronic obstructive pulmonary disease, unspecified: Secondary | ICD-10-CM

## 2014-07-26 DIAGNOSIS — C50011 Malignant neoplasm of nipple and areola, right female breast: Secondary | ICD-10-CM

## 2014-07-26 LAB — CMP (CANCER CENTER ONLY)
ALT: 15 U/L (ref 10–47)
AST: 22 U/L (ref 11–38)
Albumin: 3.5 g/dL (ref 3.3–5.5)
Alkaline Phosphatase: 84 U/L (ref 26–84)
BILIRUBIN TOTAL: 0.6 mg/dL (ref 0.20–1.60)
BUN, Bld: 13 mg/dL (ref 7–22)
CO2: 30 mEq/L (ref 18–33)
Calcium: 9 mg/dL (ref 8.0–10.3)
Chloride: 98 mEq/L (ref 98–108)
Creat: 0.5 mg/dl — ABNORMAL LOW (ref 0.6–1.2)
Glucose, Bld: 96 mg/dL (ref 73–118)
Potassium: 3.5 mEq/L (ref 3.3–4.7)
SODIUM: 142 meq/L (ref 128–145)
Total Protein: 7.1 g/dL (ref 6.4–8.1)

## 2014-07-26 LAB — CBC WITH DIFFERENTIAL (CANCER CENTER ONLY)
BASO#: 0 10*3/uL (ref 0.0–0.2)
BASO%: 0.3 % (ref 0.0–2.0)
EOS%: 2 % (ref 0.0–7.0)
Eosinophils Absolute: 0.1 10*3/uL (ref 0.0–0.5)
HCT: 40.4 % (ref 34.8–46.6)
HGB: 13.3 g/dL (ref 11.6–15.9)
LYMPH#: 1.3 10*3/uL (ref 0.9–3.3)
LYMPH%: 21.6 % (ref 14.0–48.0)
MCH: 30.3 pg (ref 26.0–34.0)
MCHC: 32.9 g/dL (ref 32.0–36.0)
MCV: 92 fL (ref 81–101)
MONO#: 0.4 10*3/uL (ref 0.1–0.9)
MONO%: 6.6 % (ref 0.0–13.0)
NEUT#: 4.1 10*3/uL (ref 1.5–6.5)
NEUT%: 69.5 % (ref 39.6–80.0)
PLATELETS: 202 10*3/uL (ref 145–400)
RBC: 4.39 10*6/uL (ref 3.70–5.32)
RDW: 12.9 % (ref 11.1–15.7)
WBC: 5.9 10*3/uL (ref 3.9–10.0)

## 2014-07-26 NOTE — Progress Notes (Signed)
Hematology and Oncology Follow Up Visit  [name] [id number] May 03, 1930 80 y.o. 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 is back for followup. We last saw her back in March. She's been doing okay. She had a good summer. Thank you, her lung did not cause her any problems. She has a bad COPD. She was not hospitalized. She's had no problems with pneumonia. She's had no issues with nausea vomiting. There's been no change in bowel or bladder habits. She's had no leg swelling. She's had no rashes.  Medications: Current outpatient prescriptions: ADVAIR DISKUS 250-50 MCG/DOSE AEPB, INHALE 1 PUFF INTO THE LUNGS EVERY 12 HOURS, Disp: 180 each, Rfl: 3;  albuterol (PROVENTIL HFA) 108 (90 BASE) MCG/ACT inhaler, Inhale 2 puffs into the lungs every 6 (six) hours as needed., Disp: 1 Inhaler, Rfl: 0;  albuterol (PROVENTIL) (2.5 MG/3ML) 0.083% nebulizer solution, Take 2.5 mg by nebulization every 6 (six) hours as needed.  , Disp: , Rfl:  ALPRAZolam (XANAX) 0.5 MG tablet, Take 0.5 mg by mouth as needed. , Disp: , Rfl: ;  amLODipine (NORVASC) 5 MG tablet, Take 1 tablet (5 mg total) by mouth daily., Disp: 90 tablet, Rfl: 1;  aspirin 81 MG tablet, Take 81 mg by mouth daily.  , Disp: , Rfl: ;  atorvastatin (LIPITOR) 10 MG tablet, Take 1 tablet (10 mg total) by mouth daily., Disp: 90 tablet, Rfl: 1 Cholecalciferol (VITAMIN D) 2000 UNITS tablet, Take 2,000 Units by mouth daily., Disp: , Rfl: ;  HYDROcodone-homatropine (HYCODAN) 5-1.5 MG/5ML syrup, Take 5 mLs by mouth every 6 (six) hours as needed for cough., Disp: 120 mL, Rfl: 0;  Multiple Vitamin (MULTI-VITAMIN DAILY PO), Take by mouth., Disp: , Rfl: ;  omeprazole (PRILOSEC) 20 MG capsule, Take 20 mg by mouth daily.  , Disp: , Rfl:  predniSONE (DELTASONE) 10 MG tablet, Take 4 tabs  daily x 2 days, 3 tabs daily x 2, 2 tab x 2 days, then 1 tab x 2 days, then stop, Disp: 20 tablet, Rfl: 0;  ramipril (ALTACE) 10 MG capsule, Take 1 capsule (10 mg total) by mouth daily., Disp: 90 capsule, Rfl: 1;  SPIRIVA HANDIHALER 18 MCG inhalation capsule, PLACE 1 CAPSULE (18 MCG TOTAL) INTO INHALER AND INHALE DAILY., Disp: 90 capsule, Rfl: 3  Allergies:  Allergies  Allergen Reactions  . Neosporin [Neomycin-Polymyxin B Gu] Itching and Rash  . Codeine Nausea Only  . Irbesartan Hives    Avapro  . Adhesive [Tape] Itching and Rash    To paper tape only. Rash and itching only where applied.    Past Medical History, Surgical history, Social history, and Family History were reviewed and updated.  Review of Systems: As above  Physical Exam:  height is 5\' 6"  (1.676 m) and weight is 140 lb (63.504 kg). Her oral temperature is 97.7 F (36.5 C). Her blood pressure is 136/88 and her pulse is 91. Her respiration is 16.   Elderly white female in no obvious distress. Her head and neck exam shows no adenopathy in the neck. She has no ocular or oral lesions. Lungs show wheezes on the right side. She has good air movement. Cardiac exam regular in rhythm with no murmurs rubs or bruits. Breast exam shows left breast with lumpectomy of the 7:00 position.  No distinct mass is noted in the left breast. There is no left axillary adenopathy. Right breast shows a well-healed lumpectomy at the 8:00 position. The some slight contraction of the right breast from radiation. No mass is noted. There is no right axillary adenopathy. Abdomen is soft. She has good bowel sounds. There is no fluid wave. There is no palpable hepato- splenomegaly. Back exam shows scoliosis. No tenderness is noted over the spine. Extremities shows no clubbing cyanosis or edema. She has age related arthritic changes. Skin exam shows numerous hyperpigmented lesions. I do not see anything that is suspicious.  Lab Results  Component Value Date   WBC  5.9 07/26/2014   HGB 13.3 07/26/2014   HCT 40.4 07/26/2014   MCV 92 07/26/2014   PLT 202 07/26/2014     Chemistry      Component Value Date/Time   NA 142 07/26/2014 0854   NA 137 06/16/2014 0906   K 3.5 07/26/2014 0854   K 3.9 06/16/2014 0906   CL 98 07/26/2014 0854   CL 100 06/16/2014 0906   CO2 30 07/26/2014 0854   CO2 30 06/16/2014 0906   BUN 13 07/26/2014 0854   BUN 12 06/16/2014 0906   CREATININE 0.5* 07/26/2014 0854   CREATININE 0.6 06/16/2014 0906      Component Value Date/Time   CALCIUM 9.0 07/26/2014 0854   CALCIUM 9.2 06/16/2014 0906   ALKPHOS 84 07/26/2014 0854   ALKPHOS 97 06/16/2014 0906   AST 22 07/26/2014 0854   AST 19 06/16/2014 0906   ALT 15 07/26/2014 0854   ALT 13 06/16/2014 0906   BILITOT 0.60 07/26/2014 0854   BILITOT 0.5 06/16/2014 0906         Impression and Plan: [name] is 80 year old white female. She has past history of DCIS of the right breast. She has a stage I carcinoma of the left breast. She completed radiation to the right breast in January 2014. The left breast cancer is in remission now over 10 years.  Again, I do not see any issues with respect to recurrent disease. Her quality of life is doing quite well right now.  Her prognosis is clearly reflective of her COPD.  We will plan to get her back in 8 months.  Volanda Napoleon, MD 11/3/201511:47 AM

## 2014-08-22 ENCOUNTER — Telehealth: Payer: Self-pay | Admitting: Pulmonary Disease

## 2014-08-22 NOTE — Telephone Encounter (Signed)
I spoke with the pt and advised that we need her to bring [place] the form. She states she will drop it off. I will send this message to [place] to follow. The form needs to be completed and mailed asap because the pt power will be turned off on 08-30-14. [place] Bing, CMA

## 2014-08-23 NOTE — Telephone Encounter (Signed)
Form has been faxed to [facility] at the # provided on form. Called and made pt aware. Copy made and placed in [facility] scan folder. Original copy mailed to pt address (pt aware as well). Nothing further needed

## 2014-08-23 NOTE — Telephone Encounter (Signed)
Form now in [facility] green folder. Thanks

## 2014-08-23 NOTE — Telephone Encounter (Signed)
Not in my folder

## 2014-08-23 NOTE — Telephone Encounter (Signed)
Pt dropped off form for [name]. Gave form to Mindy.

## 2014-08-23 NOTE — Telephone Encounter (Signed)
Form placed in [facility] look at folder. Please advise thanks

## 2014-09-29 ENCOUNTER — Ambulatory Visit (INDEPENDENT_AMBULATORY_CARE_PROVIDER_SITE_OTHER): Payer: Medicare Other | Admitting: Pulmonary Disease

## 2014-09-29 ENCOUNTER — Encounter: Payer: Self-pay | Admitting: Pulmonary Disease

## 2014-09-29 VITALS — BP 152/62 | HR 81 | Temp 97.0°F | Ht 65.5 in | Wt 139.2 lb

## 2014-09-29 DIAGNOSIS — J438 Other emphysema: Secondary | ICD-10-CM

## 2014-09-29 NOTE — Progress Notes (Signed)
   Subjective:    Patient ID: [name], female    DOB: [birth year], 81 y.o.   MRN: [id number]  HPI The patient comes in today for follow-up of her known COPD with an element of chronic respiratory failure. She is not wearing her oxygen compliantly with exertion, and this is primarily because of the weight of her POC. She has not staying active like she used to, and her conditioning has certainly suffered. She has a mild cough with intermittent discolored mucus, but does not feel like she has congestion or an acute infection. She is continuing on her bronchodilator regimen.   Review of Systems  Constitutional: Negative for fever and unexpected weight change.  HENT: Negative for congestion, dental problem, ear pain, nosebleeds, postnasal drip, rhinorrhea, sinus pressure, sneezing, sore throat and trouble swallowing.   Eyes: Negative for redness and itching.  Respiratory: Positive for cough and shortness of breath. Negative for chest tightness and wheezing.   Cardiovascular: Negative for palpitations and leg swelling.  Gastrointestinal: Negative for nausea and vomiting.  Genitourinary: Negative for dysuria.  Musculoskeletal: Negative for joint swelling.  Skin: Negative for rash.  Neurological: Negative for headaches.  Hematological: Does not bruise/bleed easily.  Psychiatric/Behavioral: Negative for dysphoric mood. The patient is not nervous/anxious.        Objective:   Physical Exam Well-developed female in no acute distress Nose without purulence or discharge noted Neck without lymphadenopathy or thyromegaly Chest with decreased breath sounds, no active wheezing Cardiac exam with regular rate and rhythm Lower extremities with 1+ edema, no cyanosis Alert and oriented, moves all 4 extremities.       Assessment & Plan:

## 2014-09-29 NOTE — Assessment & Plan Note (Signed)
The patient appears to be stable from a COPD standpoint overall, but is not staying active enough, and is having significant dyspnea on exertion because she is not wearing oxygen compliantly. She feels her portable oxygen concentrator is too heavy, and would like to look at a lighter model.  I also have recommended that she go back to pulmonary rehabilitation, and she is willing to do this this summer. She is to continue on her current bronchodilator regimen.

## 2014-09-29 NOTE — Patient Instructions (Signed)
No change in breathing medications Will send you back to pulmonary rehab in the summer.  Work on some type of exercise this winter until then Will get you a lighter and better functioning portable concentrator. followup with me again in 107mos.

## 2014-11-02 ENCOUNTER — Other Ambulatory Visit: Payer: Self-pay | Admitting: Internal Medicine

## 2014-11-07 NOTE — Telephone Encounter (Signed)
Patient states that she is in need of these RX. Requesting    ramipril (ALTACE) 10 MG capsule [[id number]]      atorvastatin (LIPITOR) 10 MG tablet [[id number]]  amLODipine (NORVASC) 5 MG tablet [[id number]]   [facility] [place]

## 2014-11-08 NOTE — Telephone Encounter (Signed)
refills done.

## 2015-01-03 ENCOUNTER — Telehealth: Payer: Self-pay | Admitting: Pulmonary Disease

## 2015-01-03 MED ORDER — LEVOFLOXACIN 750 MG PO TABS: 750.0000 mg | ORAL_TABLET | Freq: Every day | ORAL | Status: DC

## 2015-01-03 NOTE — Telephone Encounter (Signed)
Pt c/o prod cough (green) for 4 days.  SOB and wheezing, sinus drainage.  Denies fever.  Requesting rx for Levaquin.  Please advise.

## 2015-01-03 NOTE — Telephone Encounter (Signed)
Ok to call in levaquin 750mg  one a day for 5 days.

## 2015-01-03 NOTE — Telephone Encounter (Signed)
Spoke with pt and advised that rx for Levaquin was sent to pharmacy.

## 2015-02-02 ENCOUNTER — Encounter: Payer: Self-pay | Admitting: Pulmonary Disease

## 2015-02-02 ENCOUNTER — Ambulatory Visit (INDEPENDENT_AMBULATORY_CARE_PROVIDER_SITE_OTHER): Payer: Medicare Other | Admitting: Pulmonary Disease

## 2015-02-02 VITALS — BP 110/62 | HR 73 | Temp 97.1°F | Ht 65.5 in | Wt 138.2 lb

## 2015-02-02 DIAGNOSIS — J438 Other emphysema: Secondary | ICD-10-CM

## 2015-02-02 NOTE — Patient Instructions (Signed)
No change in meds. Work on conditioning as we discussed, but think about getting back to pulmonary rehab if you are not improving. followup with Dr. Lamonte Sakai in 70mos.

## 2015-02-02 NOTE — Progress Notes (Signed)
   Subjective:    Patient ID: [name], female    DOB: [birth year], 81 y.o.   MRN: [id number]  HPI Patient comes in today for follow-up of her known COPD. She has been staying on her bronchodilator regimen, and has not had any recent acute exacerbation. She did have an episode of bronchitis last month which responded well to antibiotics. She has gotten a Conservation officer, nature, but still does not use her oxygen enough with heavier exertional activities.  She is having some dry cough with a tickle in her throat, and it should be noted that she is on an ACE inhibitor.   Review of Systems  Constitutional: Negative for fever and unexpected weight change.  HENT: Negative for congestion, dental problem, ear pain, nosebleeds, postnasal drip, rhinorrhea, sinus pressure, sneezing, sore throat and trouble swallowing.   Eyes: Negative for redness and itching.  Respiratory: Positive for cough and shortness of breath. Negative for chest tightness and wheezing.   Cardiovascular: Negative for palpitations and leg swelling.  Gastrointestinal: Negative for nausea and vomiting.  Genitourinary: Negative for dysuria.  Musculoskeletal: Negative for joint swelling.  Skin: Negative for rash.  Neurological: Negative for headaches.  Hematological: Does not bruise/bleed easily.  Psychiatric/Behavioral: Negative for dysphoric mood. The patient is not nervous/anxious.        Objective:   Physical Exam Thin female in no acute distress Nose without purulence or discharge noted Neck without lymphadenopathy or thyromegaly Chest with decreased breath sounds, but no wheezing Cardiac exam with regular rate and rhythm Lower extremities without edema, no cyanosis Alert and oriented, moves all 4 extremities.       Assessment & Plan:

## 2015-02-02 NOTE — Assessment & Plan Note (Addendum)
The patient has done well since her last visit, although did have an episode of acute bronchitis last month that responded well to abx.  She is staying on her bronchodilator regimen, and I have stressed to her the importance of some type of conditioning program. She did get a new portable concentrator, and I have asked her to try and use it is much as possible with more moderate to heavier exertional activities.  She is having some increased cough, and I talked with her about getting off of her ACE inhibitor. She will discuss this with her primary physician.

## 2015-03-28 ENCOUNTER — Other Ambulatory Visit (HOSPITAL_BASED_OUTPATIENT_CLINIC_OR_DEPARTMENT_OTHER): Payer: Medicare Other

## 2015-03-28 ENCOUNTER — Encounter: Payer: Self-pay | Admitting: Hematology & Oncology

## 2015-03-28 ENCOUNTER — Ambulatory Visit (HOSPITAL_BASED_OUTPATIENT_CLINIC_OR_DEPARTMENT_OTHER): Payer: Medicare Other | Admitting: Hematology & Oncology

## 2015-03-28 VITALS — BP 144/64 | HR 80 | Temp 97.8°F | Resp 16 | Ht 65.0 in | Wt 138.0 lb

## 2015-03-28 DIAGNOSIS — M810 Age-related osteoporosis without current pathological fracture: Secondary | ICD-10-CM

## 2015-03-28 DIAGNOSIS — C50112 Malignant neoplasm of central portion of left female breast: Secondary | ICD-10-CM | POA: Diagnosis not present

## 2015-03-28 DIAGNOSIS — C50119 Malignant neoplasm of central portion of unspecified female breast: Secondary | ICD-10-CM | POA: Diagnosis not present

## 2015-03-28 DIAGNOSIS — C801 Malignant (primary) neoplasm, unspecified: Secondary | ICD-10-CM | POA: Diagnosis not present

## 2015-03-28 DIAGNOSIS — D0590 Unspecified type of carcinoma in situ of unspecified breast: Secondary | ICD-10-CM | POA: Diagnosis not present

## 2015-03-28 DIAGNOSIS — Z86000 Personal history of in-situ neoplasm of breast: Secondary | ICD-10-CM

## 2015-03-28 LAB — CBC WITH DIFFERENTIAL (CANCER CENTER ONLY)
BASO#: 0 10*3/uL (ref 0.0–0.2)
BASO%: 0.4 % (ref 0.0–2.0)
EOS%: 3 % (ref 0.0–7.0)
Eosinophils Absolute: 0.2 10*3/uL (ref 0.0–0.5)
HCT: 36.7 % (ref 34.8–46.6)
HGB: 11.9 g/dL (ref 11.6–15.9)
LYMPH#: 1.3 10*3/uL (ref 0.9–3.3)
LYMPH%: 16.6 % (ref 14.0–48.0)
MCH: 29.9 pg (ref 26.0–34.0)
MCHC: 32.4 g/dL (ref 32.0–36.0)
MCV: 92 fL (ref 81–101)
MONO#: 0.4 10*3/uL (ref 0.1–0.9)
MONO%: 5.7 % (ref 0.0–13.0)
NEUT#: 5.6 10*3/uL (ref 1.5–6.5)
NEUT%: 74.3 % (ref 39.6–80.0)
Platelets: 197 10*3/uL (ref 145–400)
RBC: 3.98 10*6/uL (ref 3.70–5.32)
RDW: 13.4 % (ref 11.1–15.7)
WBC: 7.6 10*3/uL (ref 3.9–10.0)

## 2015-03-28 LAB — COMPREHENSIVE METABOLIC PANEL
ALT: 12 U/L (ref 0–35)
AST: 16 U/L (ref 0–37)
Albumin: 3.6 g/dL (ref 3.5–5.2)
Alkaline Phosphatase: 92 U/L (ref 39–117)
BILIRUBIN TOTAL: 0.3 mg/dL (ref 0.2–1.2)
BUN: 15 mg/dL (ref 6–23)
CO2: 32 mEq/L (ref 19–32)
CREATININE: 0.54 mg/dL (ref 0.50–1.10)
Calcium: 9.2 mg/dL (ref 8.4–10.5)
Chloride: 102 mEq/L (ref 96–112)
Glucose, Bld: 116 mg/dL — ABNORMAL HIGH (ref 70–99)
Potassium: 3.7 mEq/L (ref 3.5–5.3)
Sodium: 142 mEq/L (ref 135–145)
Total Protein: 6.7 g/dL (ref 6.0–8.3)

## 2015-03-28 NOTE — Progress Notes (Signed)
Hematology and Oncology Follow Up Visit  [name] [id number] 12-27-1929 81 y.o. 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 is back for followup. We last saw her back in November. She got through the wintertime okay.  Because of her COPD, she really does not do anything during the summertime. She really has sustained side and uses her inhalers and nebulizer so that she does not have an exacerbation.  Overall, she is had no proms with poor appetite. His been no change in bowel or bladder habits. She's had no leg swelling. She's had no rashes.  She is incredibly irritated over the current presidential race.  Overall, her performance status is ECOG 2. Medications:  Current outpatient prescriptions:  .  ADVAIR DISKUS 250-50 MCG/DOSE AEPB, INHALE 1 PUFF INTO THE LUNGS EVERY 12 HOURS, Disp: 180 each, Rfl: 3 .  albuterol (PROVENTIL HFA) 108 (90 BASE) MCG/ACT inhaler, Inhale 2 puffs into the lungs every 6 (six) hours as needed., Disp: 1 Inhaler, Rfl: 0 .  albuterol (PROVENTIL) (2.5 MG/3ML) 0.083% nebulizer solution, Take 2.5 mg by nebulization every 6 (six) hours as needed.  , Disp: , Rfl:  .  ALPRAZolam (XANAX) 0.5 MG tablet, Take 0.5 mg by mouth as needed. , Disp: , Rfl:  .  amLODipine (NORVASC) 5 MG tablet, TAKE 1 TABLET (5 MG TOTAL) BY MOUTH DAILY., Disp: 90 tablet, Rfl: 1 .  aspirin 81 MG tablet, Take 81 mg by mouth daily.  , Disp: , Rfl:  .  atorvastatin (LIPITOR) 10 MG tablet, TAKE 1 TABLET (10 MG TOTAL) BY MOUTH DAILY., Disp: 90 tablet, Rfl: 1 .  Cholecalciferol (VITAMIN D) 2000 UNITS tablet, Take 2,000 Units by mouth daily., Disp: , Rfl:  .  HYDROcodone-homatropine (HYCODAN) 5-1.5 MG/5ML syrup, Take 5 mLs by mouth every 6 (six) hours as needed for cough., Disp: 120 mL, Rfl: 0 .  Multiple Vitamin  (MULTI-VITAMIN DAILY PO), Take by mouth., Disp: , Rfl:  .  omeprazole (PRILOSEC) 20 MG capsule, Take 20 mg by mouth daily.  , Disp: , Rfl:  .  ramipril (ALTACE) 10 MG capsule, TAKE 1 CAPSULE (10 MG TOTAL) BY MOUTH DAILY., Disp: 90 capsule, Rfl: 1 .  saccharomyces boulardii (FLORASTOR) 250 MG capsule, Take 250 mg by mouth every morning., Disp: , Rfl:  .  SPIRIVA HANDIHALER 18 MCG inhalation capsule, PLACE 1 CAPSULE (18 MCG TOTAL) INTO INHALER AND INHALE DAILY., Disp: 90 capsule, Rfl: 3  Allergies:  Allergies  Allergen Reactions  . Neosporin [Neomycin-Polymyxin B Gu] Itching and Rash  . Codeine Nausea Only  . Irbesartan Hives    Avapro  . Adhesive [Tape] Itching and Rash    To paper tape only. Rash and itching only where applied.    Past Medical History, Surgical history, Social history, and Family History were reviewed and updated.  Review of Systems: As above  Physical Exam:  height is 5\' 5"  (1.651 m) and weight is 138 lb (62.596 kg). Her oral temperature is 97.8 F (36.6 C). Her blood pressure is 144/64 and her pulse is 80. Her respiration is 16.   Elderly white female in no obvious distress. Her head and neck exam shows no adenopathy in the neck. She has no ocular or oral lesions. Lungs show wheezes on the right side.  She has good air movement. Cardiac exam regular in rhythm with no murmurs rubs or bruits. Breast exam shows left breast with lumpectomy of the 7:00 position. No distinct mass is noted in the left breast. There is no left axillary adenopathy. Right breast shows a well-healed lumpectomy at the 8:00 position. The some slight contraction of the right breast from radiation. No mass is noted. There is no right axillary adenopathy. Abdomen is soft. She has good bowel sounds. There is no fluid wave. There is no palpable hepato- splenomegaly. Back exam shows scoliosis. No tenderness is noted over the spine. Extremities shows no clubbing cyanosis or edema. She has age related  arthritic changes. Skin exam shows numerous hyperpigmented lesions. I do not see anything that is suspicious.  Lab Results  Component Value Date   WBC 7.6 03/28/2015   HGB 11.9 03/28/2015   HCT 36.7 03/28/2015   MCV 92 03/28/2015   PLT 197 03/28/2015     Chemistry      Component Value Date/Time   NA 142 07/26/2014 0854   NA 137 06/16/2014 0906   K 3.5 07/26/2014 0854   K 3.9 06/16/2014 0906   CL 98 07/26/2014 0854   CL 100 06/16/2014 0906   CO2 30 07/26/2014 0854   CO2 30 06/16/2014 0906   BUN 13 07/26/2014 0854   BUN 12 06/16/2014 0906   CREATININE 0.5* 07/26/2014 0854   CREATININE 0.6 06/16/2014 0906      Component Value Date/Time   CALCIUM 9.0 07/26/2014 0854   CALCIUM 9.2 06/16/2014 0906   ALKPHOS 84 07/26/2014 0854   ALKPHOS 97 06/16/2014 0906   AST 22 07/26/2014 0854   AST 19 06/16/2014 0906   ALT 15 07/26/2014 0854   ALT 13 06/16/2014 0906   BILITOT 0.60 07/26/2014 0854   BILITOT 0.5 06/16/2014 0906         Impression and Plan: [name] is 81 year old white female. She has past history of DCIS of the right breast. She has a stage I carcinoma of the left breast. She completed radiation to the right breast in January 2014. The left breast cancer is in remission now over 10 years.  Again, I do not see any issues with respect to recurrent disease. Her quality of life is doing quite well right now.  Her prognosis is clearly reflective of her COPD.  We will plan to get her back in 1 year.  Erika Napoleon, MD 7/5/20162:55 PM

## 2015-03-29 ENCOUNTER — Telehealth: Payer: Self-pay | Admitting: *Deleted

## 2015-03-29 LAB — VITAMIN D 25 HYDROXY (VIT D DEFICIENCY, FRACTURES): VIT D 25 HYDROXY: 64 ng/mL (ref 30–100)

## 2015-03-29 NOTE — Telephone Encounter (Addendum)
Patient aware of results.   ----- Message from Volanda Napoleon, MD sent at 03/29/2015  7:02 AM EDT ----- Call - labs and vit D are ok!  pete

## 2015-03-31 ENCOUNTER — Telehealth: Payer: Self-pay | Admitting: Pulmonary Disease

## 2015-03-31 MED ORDER — TIOTROPIUM BROMIDE MONOHYDRATE 18 MCG IN CAPS: ORAL_CAPSULE | RESPIRATORY_TRACT | Status: DC

## 2015-03-31 NOTE — Telephone Encounter (Signed)
Spoke with pt. She needed a refill on her spiriva. Refill sent in. Nothing further needed

## 2015-04-22 ENCOUNTER — Emergency Department (HOSPITAL_COMMUNITY)
Admission: EM | Admit: 2015-04-22 | Discharge: 2015-04-22 | Disposition: A | Discharge status: Home / Self Care | Payer: Medicare Other | Attending: Emergency Medicine | Admitting: Emergency Medicine

## 2015-04-22 ENCOUNTER — Encounter (HOSPITAL_COMMUNITY): Payer: Self-pay | Admitting: Emergency Medicine

## 2015-04-22 ENCOUNTER — Emergency Department (HOSPITAL_COMMUNITY): Payer: Medicare Other

## 2015-04-22 DIAGNOSIS — M1990 Unspecified osteoarthritis, unspecified site: Secondary | ICD-10-CM | POA: Insufficient documentation

## 2015-04-22 DIAGNOSIS — Z9849 Cataract extraction status, unspecified eye: Secondary | ICD-10-CM | POA: Insufficient documentation

## 2015-04-22 DIAGNOSIS — Y9289 Other specified places as the place of occurrence of the external cause: Secondary | ICD-10-CM | POA: Insufficient documentation

## 2015-04-22 DIAGNOSIS — E785 Hyperlipidemia, unspecified: Secondary | ICD-10-CM | POA: Insufficient documentation

## 2015-04-22 DIAGNOSIS — R079 Chest pain, unspecified: Secondary | ICD-10-CM | POA: Diagnosis not present

## 2015-04-22 DIAGNOSIS — Z9981 Dependence on supplemental oxygen: Secondary | ICD-10-CM | POA: Diagnosis not present

## 2015-04-22 DIAGNOSIS — S6991XA Unspecified injury of right wrist, hand and finger(s), initial encounter: Secondary | ICD-10-CM | POA: Insufficient documentation

## 2015-04-22 DIAGNOSIS — Y998 Other external cause status: Secondary | ICD-10-CM | POA: Diagnosis not present

## 2015-04-22 DIAGNOSIS — M810 Age-related osteoporosis without current pathological fracture: Secondary | ICD-10-CM | POA: Insufficient documentation

## 2015-04-22 DIAGNOSIS — M25531 Pain in right wrist: Secondary | ICD-10-CM | POA: Diagnosis not present

## 2015-04-22 DIAGNOSIS — Z79899 Other long term (current) drug therapy: Secondary | ICD-10-CM | POA: Diagnosis not present

## 2015-04-22 DIAGNOSIS — Z7982 Long term (current) use of aspirin: Secondary | ICD-10-CM | POA: Diagnosis not present

## 2015-04-22 DIAGNOSIS — Y9389 Activity, other specified: Secondary | ICD-10-CM | POA: Insufficient documentation

## 2015-04-22 DIAGNOSIS — I10 Essential (primary) hypertension: Secondary | ICD-10-CM | POA: Diagnosis not present

## 2015-04-22 DIAGNOSIS — S79911A Unspecified injury of right hip, initial encounter: Secondary | ICD-10-CM | POA: Insufficient documentation

## 2015-04-22 DIAGNOSIS — M25551 Pain in right hip: Secondary | ICD-10-CM

## 2015-04-22 DIAGNOSIS — M79641 Pain in right hand: Secondary | ICD-10-CM

## 2015-04-22 DIAGNOSIS — Z87891 Personal history of nicotine dependence: Secondary | ICD-10-CM | POA: Diagnosis not present

## 2015-04-22 DIAGNOSIS — S20211A Contusion of right front wall of thorax, initial encounter: Secondary | ICD-10-CM

## 2015-04-22 DIAGNOSIS — S299XXA Unspecified injury of thorax, initial encounter: Secondary | ICD-10-CM | POA: Diagnosis present

## 2015-04-22 DIAGNOSIS — Z853 Personal history of malignant neoplasm of breast: Secondary | ICD-10-CM | POA: Diagnosis not present

## 2015-04-22 DIAGNOSIS — F329 Major depressive disorder, single episode, unspecified: Secondary | ICD-10-CM | POA: Diagnosis not present

## 2015-04-22 DIAGNOSIS — W19XXXA Unspecified fall, initial encounter: Secondary | ICD-10-CM

## 2015-04-22 DIAGNOSIS — J45909 Unspecified asthma, uncomplicated: Secondary | ICD-10-CM | POA: Diagnosis not present

## 2015-04-22 DIAGNOSIS — K219 Gastro-esophageal reflux disease without esophagitis: Secondary | ICD-10-CM | POA: Diagnosis not present

## 2015-04-22 DIAGNOSIS — Z923 Personal history of irradiation: Secondary | ICD-10-CM | POA: Insufficient documentation

## 2015-04-22 DIAGNOSIS — F419 Anxiety disorder, unspecified: Secondary | ICD-10-CM | POA: Insufficient documentation

## 2015-04-22 MED ORDER — OXYCODONE-ACETAMINOPHEN 5-325 MG PO TABS: 1.0000 | ORAL_TABLET | Freq: Four times a day (QID) | ORAL | Status: DC | PRN

## 2015-04-22 MED ORDER — OXYCODONE-ACETAMINOPHEN 5-325 MG PO TABS
1.0000 | ORAL_TABLET | Freq: Once | ORAL | Status: AC
Start: 2015-04-22 — End: 2015-04-22
  Administered 2015-04-22: 1 via ORAL
  Filled 2015-04-22: qty 1

## 2015-04-22 NOTE — ED Notes (Signed)
Patient states that she fell while getting out of a car last night.  She landed in the grass on her right side.  States that it hurts to breathe. C/O Right hip, Right hand, right chest pain.  States that she did not hit her head.  Denies anticoagulant use. Brusing noted on her right medial hand.  States that her right hip hurts when she walks.

## 2015-04-22 NOTE — ED Notes (Signed)
Pt. Stated, I was getting out of the back seat and fell onto the groud last night.  My rt. Side hurts and my rt. Hip. Pt. Normally walks ok.

## 2015-04-22 NOTE — ED Provider Notes (Signed)
CSN: [id number]     Arrival date & time 04/22/15  1010 History   First MD Initiated Contact with Patient 04/22/15 1020     Chief Complaint  Patient presents with  . Fall  . Rib Injury     (Consider location/radiation/quality/duration/timing/severity/associated sxs/prior Treatment) HPI  Pt presenting with c/o pain in rigth ribs, right wrist and hand and right hip after mechanical fall last night.  Pain has been constant.  Worse with movement and palpation.  Pt was getting out of the front seat of a car and was getting out of the way of the back door opening causing her to lose her balance and fall to the ground on her right side.  No head injury.  Denies neck or back pain.  Has not had any treatment prior to arrival.  There are no other associated systemic symptoms, there are no other alleviating or modifying factors.   Past Medical History  Diagnosis Date  . Other emphysema   . Unspecified essential hypertension   . Osteoporosis, unspecified   . Other and unspecified hyperlipidemia   . Esophageal reflux   . Unspecified asthma(493.90)   . Cancer   . Arthritis   . Emphysema of lung   . PONV (postoperative nausea and vomiting)   . On home oxygen therapy     at night  . Full dentures   . Wears glasses   . History of radiation therapy 07/01/06-08/13/06    left breast  total dose 6100 cGy  . Breast cancer 04/11/06 bx    left breast invasive cductal ca  . 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 Anxiety   . Depression   . Emphysema   . Cataract     extraction  . Allergy   . Hx of radiation therapy 08/31/12-09/28/12    right breast    Past Surgical History  Procedure Laterality Date  . Partial hysterectomy    . Cataract extraction    . Hip surgery  1993    left  . Breast surgery    . Cyst on face  6 months ago  . Mass excision  02/18/2012    Procedure: MINOR EXCISION OF MASS;  Surgeon: Adin Hector, MD;   Location: [facility];  Service: General;  Laterality: Left;  excise 2.5 cm. mass left chest wall  . Mastectomy  05/14/06    lt lumpectomy-node dissDr.Leone  . Breast lumpectomy  07/13/12    right -DCIS 7 o'clock stage Tis,Nx  . Abdominal hysterectomy      partial   Family History  Problem Relation Age of Onset  . Coronary artery disease Mother   . Diabetes Father   . Cancer Father     prostate cancer  . Breast cancer Sister   . Cancer Sister   . Kidney cancer Brother   . COPD     History  Substance Use Topics  . Smoking status: Former Smoker -- 1.50 packs/day for 43 years    Types: Cigarettes    Start date: 11/23/1953    Quit date: 09/24/1995  . Smokeless tobacco: Never Used     Comment: quit 16 years ago  . Alcohol Use: No   OB History    No data available     Review of Systems  ROS reviewed and all otherwise negative except for mentioned in HPI    Allergies  Neosporin; Codeine; Irbesartan; and Adhesive  Home Medications   Prior to Admission medications   Medication Sig Start Date End Date Taking? Authorizing Provider  ADVAIR DISKUS 250-50 MCG/DOSE AEPB INHALE 1 PUFF INTO THE LUNGS EVERY 12 HOURS 05/24/14   Kathee Delton, MD  albuterol (PROVENTIL HFA) 108 (90 BASE) MCG/ACT inhaler Inhale 2 puffs into the lungs every 6 (six) hours as needed. 09/03/12   Kathee Delton, MD  albuterol (PROVENTIL) (2.5 MG/3ML) 0.083% nebulizer solution Take 2.5 mg by nebulization every 6 (six) hours as needed.      Historical Provider, MD  ALPRAZolam Duanne Moron) 0.5 MG tablet Take 0.5 mg by mouth as needed.  03/31/12   Historical Provider, MD  amLODipine (NORVASC) 5 MG tablet TAKE 1 TABLET (5 MG TOTAL) BY MOUTH DAILY. 11/08/14   Biagio Borg, MD  aspirin 81 MG tablet Take 81 mg by mouth daily.      Historical Provider, MD  atorvastatin (LIPITOR) 10 MG tablet TAKE 1 TABLET (10 MG TOTAL) BY MOUTH DAILY. 11/08/14   Biagio Borg, MD  Cholecalciferol (VITAMIN D) 2000 UNITS tablet Take  2,000 Units by mouth daily.    Historical Provider, MD  HYDROcodone-homatropine (HYCODAN) 5-1.5 MG/5ML syrup Take 5 mLs by mouth every 6 (six) hours as needed for cough. 05/25/14   Kathee Delton, MD  Multiple Vitamin (MULTI-VITAMIN DAILY PO) Take by mouth.    Historical Provider, MD  omeprazole (PRILOSEC) 20 MG capsule Take 20 mg by mouth daily.      Historical Provider, MD  oxyCODONE-acetaminophen (PERCOCET/ROXICET) 5-325 MG per tablet Take 1-2 tablets by mouth every 6 (six) hours as needed for severe pain. 04/22/15   Alfonzo Beers, MD  ramipril (ALTACE) 10 MG capsule TAKE 1 CAPSULE (10 MG TOTAL) BY MOUTH DAILY. 11/08/14   Biagio Borg, MD  saccharomyces boulardii (FLORASTOR) 250 MG capsule Take 250 mg by mouth every morning.    Historical Provider, MD  tiotropium (SPIRIVA HANDIHALER) 18 MCG inhalation capsule PLACE 1 CAPSULE (18 MCG TOTAL) INTO INHALER AND INHALE DAILY. 03/31/15   Collene Gobble, MD   BP 149/61 mmHg  Pulse 80  Temp(Src) 98.1 F (36.7 C) (Oral)  Resp 23  Ht 5\' 6"  (1.676 m)  Wt 138 lb (62.596 kg)  BMI 22.28 kg/m2  SpO2 96%  Vitals reviewed Physical Exam  Physical Examination: General appearance - alert, well appearing, and in no distress Mental status - alert, oriented to person, place, and time Eyes - PERRL, EOMI Mouth - mucous membranes moist, pharynx normal without lesions Neck - no midline tenderness of cspine, FROM without pain Chest - clear to auscultation, no wheezes, rales or rhonchi, symmetric air entry, ttp over right chest wall at midaxillary line, no crepitus, or overlying bruising Heart - normal rate, regular rhythm, normal S1, S2, no murmurs, rubs, clicks or gallops Abdomen - soft, nontender, nondistended, no masses or organomegaly Back exam - no midline tenderness to palpation of c/t/l spine, no CVA tenderness Neurological - alert, oriented x 3, normal speech, strength 5/5 in extremities x 4 Musculoskeletal - ttp over right lateral hip, some pain with ROM of  right hip, no deformity, othewise  no joint tenderness, deformity or swelling, bruising and ttp over dorsum of right hand at ulnar aspect as well as dorsum of wrist Extremities - peripheral pulses normal, no pedal edema, no clubbing or cyanosis Skin - normal coloration and turgor, no rashes  ED Course  Procedures (including critical care time) Labs Review Labs Reviewed - No data to  display  Imaging Review No results found.   EKG Interpretation None      MDM   Final diagnoses:  Fall, initial encounter  Contusion of rib, right, initial encounter  Right hip pain  Right wrist pain  Right hand pain    Pt presenting after mechanical fall with pain in right chest wall, right hand and wrist and right hip. Xray imaging shows irregularity of 5th metacarpal which could represent fracture- this is her area of tenderness.  Also some scapholunate widening representing possible ligamentous injury.  Pt placed in splint and given information for hand followup.  Percocet worked well for her pain in the ED.  Given rx for short course of this. Discharged with strict return precautions.  Pt agreeable with plan.    Alfonzo Beers, MD 04/24/15 [phone]

## 2015-04-22 NOTE — Discharge Instructions (Signed)
Return to the ED with any concerns including difficulty breathing, worsening pain, numbness/discoloration/swelling of fingers, decreased level of alertness/lethargy, or any other alarming symptoms  You should use the incentive spirometer approx 10 times per hour while awake, wear the wrist splint until you followup with hand surgery

## 2015-04-26 ENCOUNTER — Telehealth: Payer: Self-pay | Admitting: Emergency Medicine

## 2015-04-26 DIAGNOSIS — J438 Other emphysema: Secondary | ICD-10-CM

## 2015-04-26 NOTE — Telephone Encounter (Signed)
Spoke with pt's sister Tye [place], states pt fell Friday, broke her hand.  This afternoon started coughing up dark brown mucus.  Notes chest soreness worse on R side.   Denies fever, sinus congestion.  Pt had xray of her ribs on  7/30 when seen in ED after her fall.  This xray is in [place].  Pt taking percocet prn for her hand.   Pt uses [facility] on [address].   Has appt with RB on 05/02/15.   Sending to doc of day as RB is on vacation. Tammy please advise.  Thanks!

## 2015-04-26 NOTE — Telephone Encounter (Signed)
Pt's sister aware of RA's recs.  cxr ordered.  Nothing further needed at this time.

## 2015-04-26 NOTE — Telephone Encounter (Signed)
Sent to TP in error: she is not in the office this afternoon. RA please advise.  Thanks!

## 2015-04-26 NOTE — Telephone Encounter (Signed)
She can get another CXR to look for  lung trauma before OV on 8/9

## 2015-04-27 ENCOUNTER — Ambulatory Visit (INDEPENDENT_AMBULATORY_CARE_PROVIDER_SITE_OTHER)
Admission: RE | Admit: 2015-04-27 | Discharge: 2015-04-27 | Disposition: A | Discharge status: Home / Self Care | Payer: Medicare Other | Source: Ambulatory Visit | Attending: Pulmonary Disease | Admitting: Pulmonary Disease

## 2015-04-27 DIAGNOSIS — S62344A Nondisplaced fracture of base of fourth metacarpal bone, right hand, initial encounter for closed fracture: Secondary | ICD-10-CM | POA: Diagnosis not present

## 2015-04-27 DIAGNOSIS — S60212A Contusion of left wrist, initial encounter: Secondary | ICD-10-CM | POA: Diagnosis not present

## 2015-04-27 DIAGNOSIS — R05 Cough: Secondary | ICD-10-CM | POA: Diagnosis not present

## 2015-04-27 DIAGNOSIS — J438 Other emphysema: Secondary | ICD-10-CM

## 2015-04-27 DIAGNOSIS — S62346A Nondisplaced fracture of base of fifth metacarpal bone, right hand, initial encounter for closed fracture: Secondary | ICD-10-CM | POA: Diagnosis not present

## 2015-04-28 ENCOUNTER — Telehealth: Payer: Self-pay | Admitting: Emergency Medicine

## 2015-04-28 ENCOUNTER — Other Ambulatory Visit: Payer: Self-pay

## 2015-04-28 MED ORDER — AZITHROMYCIN 250 MG PO TABS: ORAL_TABLET | ORAL | Status: DC

## 2015-04-28 NOTE — Telephone Encounter (Signed)
Patient notified of appt date and time. Patient notified that CXR results will be discussed at [place]. Nothing further needed.

## 2015-05-01 ENCOUNTER — Other Ambulatory Visit: Payer: Self-pay | Admitting: Internal Medicine

## 2015-05-02 ENCOUNTER — Encounter: Payer: Self-pay | Admitting: Emergency Medicine

## 2015-05-02 ENCOUNTER — Ambulatory Visit (INDEPENDENT_AMBULATORY_CARE_PROVIDER_SITE_OTHER): Payer: Medicare Other | Admitting: Emergency Medicine

## 2015-05-02 VITALS — BP 136/62 | HR 88 | Ht 66.0 in | Wt 138.0 lb

## 2015-05-02 DIAGNOSIS — J438 Other emphysema: Secondary | ICD-10-CM | POA: Diagnosis not present

## 2015-05-02 NOTE — Progress Notes (Signed)
Subjective:    Patient ID: [name], female    DOB: [birth year], 81 y.o.   MRN: [id number]  HPI 81 year old woman, former smoker (60 pack years), with a history of COPD, also breast cancer, hypertension, GERD. She has been followed by Dr Gwenette Greet and is on Advair and Spiriva. Her dyspnea has been somewhat worse with the hot humid weather. She has a daily cough prod of white. She uses albuterol rarely. She does have some exertional wheeze. She sleeps with oxygen and with exertion. No flare ups since last time. She did suffer a fall last week to cause her to have some right flank and right hip bruising, no rib fx   Review of Systems As per HPI   Past Medical History  Diagnosis Date  . Other emphysema   . Unspecified essential hypertension   . Osteoporosis, unspecified   . Other and unspecified hyperlipidemia   . Esophageal reflux   . Unspecified asthma(493.90)   . Cancer   . Arthritis   . Emphysema of lung   . PONV (postoperative nausea and vomiting)   . On home oxygen therapy     at night  . Full dentures   . Wears glasses   . History of radiation therapy 07/01/06-08/13/06    left breast  total dose 6100 cGy  . Breast cancer 04/11/06 bx    left breast invasive cductal ca  . 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 Anxiety   . Depression   . Emphysema   . Cataract     extraction  . Allergy   . Hx of radiation therapy 08/31/12-09/28/12    right breast      Family History  Problem Relation Age of Onset  . Coronary artery disease Mother   . Diabetes Father   . Cancer Father     prostate cancer  . Breast cancer Sister   . Cancer Sister   . Kidney cancer Brother   . COPD       History   Social History  . Marital Status: Widowed    Spouse Name: N/A  . Number of Children: 1  . Years of Education: N/A   Occupational History  . Retired     [employer] Teacher, early years/pre   Social History Main  Topics  . Smoking status: Former Smoker -- 1.50 packs/day for 43 years    Types: Cigarettes    Start date: 11/23/1953    Quit date: 09/24/1995  . Smokeless tobacco: Never Used     Comment: quit 16 years ago  . Alcohol Use: No  . Drug Use: No  . Sexual Activity: No   Other Topics Concern  . Not on file   Social History Narrative     Allergies  Allergen Reactions  . Neosporin [Neomycin-Polymyxin B Gu] Itching and Rash  . Codeine Nausea Only  . Irbesartan Hives    Avapro  . Adhesive [Tape] Itching and Rash    To paper tape only. Rash and itching only where applied.     Outpatient Prescriptions Prior to Visit  Medication Sig Dispense Refill  . ADVAIR DISKUS 250-50 MCG/DOSE AEPB INHALE 1 PUFF INTO THE LUNGS EVERY 12 HOURS 180 each 3  . albuterol (PROVENTIL HFA) 108 (90 BASE) MCG/ACT inhaler Inhale 2 puffs into the lungs every 6 (six) hours as needed. 1 Inhaler 0  . albuterol (PROVENTIL) (2.5 MG/3ML) 0.083% nebulizer solution Take 2.5  mg by nebulization every 6 (six) hours as needed.      . ALPRAZolam (XANAX) 0.5 MG tablet Take 0.5 mg by mouth as needed.     Marland Kitchen amLODipine (NORVASC) 5 MG tablet TAKE 1 TABLET (5 MG TOTAL) BY MOUTH DAILY. 90 tablet 1  . aspirin 81 MG tablet Take 81 mg by mouth daily.      Marland Kitchen atorvastatin (LIPITOR) 10 MG tablet TAKE 1 TABLET (10 MG TOTAL) BY MOUTH DAILY. 90 tablet 0  . Cholecalciferol (VITAMIN D) 2000 UNITS tablet Take 2,000 Units by mouth daily.    Marland Kitchen HYDROcodone-homatropine (HYCODAN) 5-1.5 MG/5ML syrup Take 5 mLs by mouth every 6 (six) hours as needed for cough. 120 mL 0  . Multiple Vitamin (MULTI-VITAMIN DAILY PO) Take by mouth.    Marland Kitchen omeprazole (PRILOSEC) 20 MG capsule Take 20 mg by mouth daily.      . ramipril (ALTACE) 10 MG capsule TAKE 1 CAPSULE (10 MG TOTAL) BY MOUTH DAILY. 90 capsule 1  . saccharomyces boulardii (FLORASTOR) 250 MG capsule Take 250 mg by mouth every morning.    . tiotropium (SPIRIVA HANDIHALER) 18 MCG inhalation capsule PLACE 1  CAPSULE (18 MCG TOTAL) INTO INHALER AND INHALE DAILY. 90 capsule 1  . azithromycin (ZITHROMAX Z-PAK) 250 MG tablet Take 2 tabs today, then 1 daily until gone. 6 each 0  . oxyCODONE-acetaminophen (PERCOCET/ROXICET) 5-325 MG per tablet Take 1-2 tablets by mouth every 6 (six) hours as needed for severe pain. 6 tablet 0   No facility-administered medications prior to visit.        Objective:   Physical Exam Filed Vitals:   05/02/15 0947  BP: 136/62  Pulse: 88  Height: 5\' 6"  (1.676 m)  Weight: 138 lb (62.596 kg)  SpO2: 92%   Gen: Pleasant, elderly woman in a wheelchair, in no distress,  normal affect  ENT: No lesions,  mouth clear,  oropharynx clear, no postnasal drip  Neck: No JVD, no TMG, no carotid bruits  Lungs: No use of accessory muscles, distant but clear, no wheezes  Cardiovascular: RRR, heart sounds normal, no murmur or gallops, trace ankle edema bilaterally  Musculoskeletal: No deformities, no cyanosis or clubbing  Neuro: alert, non focal  Skin: Warm, no lesions or rashes      Assessment & Plan:  COPD (chronic obstructive pulmonary disease) with emphysema  No exacerbation since last time. She does still have some exertional wheezing but her breathing is stable. She does not use albuterol frequently. We will continue Spiriva and Advair, albuterol when necessary, follow in 4 months

## 2015-05-02 NOTE — Patient Instructions (Signed)
Please continue your Advair and Spiriva as you are taking them Use albuterol as needed.  Wear your oxygen with exertion and while sleeping.  Follow with Dr Lamonte Sakai in 4 months or sooner if you have any problems.

## 2015-05-02 NOTE — Assessment & Plan Note (Signed)
No exacerbation since last time. She does still have some exertional wheezing but her breathing is stable. She does not use albuterol frequently. We will continue Spiriva and Advair, albuterol when necessary, follow in 4 months

## 2015-05-11 DIAGNOSIS — S60212A Contusion of left wrist, initial encounter: Secondary | ICD-10-CM | POA: Diagnosis not present

## 2015-05-11 DIAGNOSIS — S62346A Nondisplaced fracture of base of fifth metacarpal bone, right hand, initial encounter for closed fracture: Secondary | ICD-10-CM | POA: Diagnosis not present

## 2015-05-17 ENCOUNTER — Encounter: Payer: Self-pay | Admitting: Internal Medicine

## 2015-05-17 ENCOUNTER — Ambulatory Visit (INDEPENDENT_AMBULATORY_CARE_PROVIDER_SITE_OTHER): Payer: Medicare Other | Admitting: Internal Medicine

## 2015-05-17 ENCOUNTER — Other Ambulatory Visit (INDEPENDENT_AMBULATORY_CARE_PROVIDER_SITE_OTHER): Payer: Medicare Other

## 2015-05-17 ENCOUNTER — Ambulatory Visit (INDEPENDENT_AMBULATORY_CARE_PROVIDER_SITE_OTHER)
Admission: RE | Admit: 2015-05-17 | Discharge: 2015-05-17 | Disposition: A | Discharge status: Home / Self Care | Payer: Medicare Other | Source: Ambulatory Visit | Attending: Internal Medicine | Admitting: Internal Medicine

## 2015-05-17 VITALS — BP 148/62 | HR 87 | Temp 97.9°F | Ht 66.0 in | Wt 135.0 lb

## 2015-05-17 DIAGNOSIS — M545 Low back pain, unspecified: Secondary | ICD-10-CM

## 2015-05-17 DIAGNOSIS — I10 Essential (primary) hypertension: Secondary | ICD-10-CM | POA: Diagnosis not present

## 2015-05-17 DIAGNOSIS — Z9889 Other specified postprocedural states: Secondary | ICD-10-CM | POA: Diagnosis not present

## 2015-05-17 DIAGNOSIS — R739 Hyperglycemia, unspecified: Secondary | ICD-10-CM

## 2015-05-17 DIAGNOSIS — E785 Hyperlipidemia, unspecified: Secondary | ICD-10-CM

## 2015-05-17 DIAGNOSIS — M5136 Other intervertebral disc degeneration, lumbar region: Secondary | ICD-10-CM | POA: Diagnosis not present

## 2015-05-17 DIAGNOSIS — M25551 Pain in right hip: Secondary | ICD-10-CM | POA: Insufficient documentation

## 2015-05-17 DIAGNOSIS — M898X8 Other specified disorders of bone, other site: Secondary | ICD-10-CM | POA: Diagnosis not present

## 2015-05-17 LAB — BASIC METABOLIC PANEL
BUN: 13 mg/dL (ref 6–23)
CO2: 32 meq/L (ref 19–32)
Calcium: 9.4 mg/dL (ref 8.4–10.5)
Chloride: 99 mEq/L (ref 96–112)
Creatinine, Ser: 0.57 mg/dL (ref 0.40–1.20)
GFR: 107.69 mL/min (ref >60.00)
GLUCOSE: 91 mg/dL (ref 70–99)
Potassium: 4 mEq/L (ref 3.5–5.1)
SODIUM: 137 meq/L (ref 135–145)

## 2015-05-17 LAB — HEPATIC FUNCTION PANEL
ALBUMIN: 3.7 g/dL (ref 3.5–5.2)
ALK PHOS: 130 U/L — AB (ref 39–117)
ALT: 10 U/L (ref 0–35)
AST: 15 U/L (ref 0–37)
BILIRUBIN DIRECT: 0 mg/dL (ref 0.0–0.3)
TOTAL PROTEIN: 7.5 g/dL (ref 6.0–8.3)
Total Bilirubin: 0.3 mg/dL (ref 0.2–1.2)

## 2015-05-17 LAB — LIPID PANEL
CHOLESTEROL: 143 mg/dL (ref 0–200)
HDL: 50.4 mg/dL (ref >39.00)
LDL Cholesterol: 66 mg/dL (ref 0–99)
NonHDL: 92.33
Total CHOL/HDL Ratio: 3
Triglycerides: 131 mg/dL (ref 0.0–149.0)
VLDL: 26.2 mg/dL (ref 0.0–40.0)

## 2015-05-17 LAB — URINALYSIS, ROUTINE W REFLEX MICROSCOPIC
Bilirubin Urine: NEGATIVE
Hgb urine dipstick: NEGATIVE
KETONES UR: NEGATIVE
Nitrite: NEGATIVE
PH: 6.5 (ref 5.0–8.0)
SPECIFIC GRAVITY, URINE: 1.015 (ref 1.000–1.030)
Total Protein, Urine: NEGATIVE
Urine Glucose: NEGATIVE
Urobilinogen, UA: 0.2 (ref 0.0–1.0)

## 2015-05-17 LAB — CBC WITH DIFFERENTIAL/PLATELET
BASOS ABS: 0 10*3/uL (ref 0.0–0.1)
Basophils Relative: 0.3 % (ref 0.0–3.0)
Eosinophils Absolute: 0.1 10*3/uL (ref 0.0–0.7)
Eosinophils Relative: 1.1 % (ref 0.0–5.0)
HCT: 39 % (ref 36.0–46.0)
Hemoglobin: 12.8 g/dL (ref 12.0–15.0)
Lymphocytes Relative: 23 % (ref 12.0–46.0)
Lymphs Abs: 2 10*3/uL (ref 0.7–4.0)
MCHC: 32.9 g/dL (ref 30.0–36.0)
MCV: 88.9 fl (ref 78.0–100.0)
MONO ABS: 0.6 10*3/uL (ref 0.1–1.0)
Monocytes Relative: 6.6 % (ref 3.0–12.0)
NEUTROS ABS: 5.9 10*3/uL (ref 1.4–7.7)
Neutrophils Relative %: 69 % (ref 43.0–77.0)
PLATELETS: 278 10*3/uL (ref 150.0–400.0)
RBC: 4.39 Mil/uL (ref 3.87–5.11)
RDW: 14.7 % (ref 11.5–15.5)
WBC: 8.5 10*3/uL (ref 4.0–10.5)

## 2015-05-17 LAB — HEMOGLOBIN A1C: HEMOGLOBIN A1C: 5.9 % (ref 4.6–6.5)

## 2015-05-17 LAB — TSH: TSH: 1.37 u[IU]/mL (ref 0.35–4.50)

## 2015-05-17 NOTE — Assessment & Plan Note (Signed)
stable overall by history and exam, recent data reviewed with pt, and pt to continue medical treatment as before,  to f/u any worsening symptoms or concerns BP Readings from Last 3 Encounters:  05/17/15 148/62  05/02/15 136/62  04/22/15 149/61

## 2015-05-17 NOTE — Progress Notes (Signed)
Subjective:    Patient ID: [name], female    DOB: [birth year], 81 y.o.   MRN: [id number]  HPI  Here to f/u, overall doing ok but fell several days ago, walks with walker but tripped on hard ground outside the home, no other falls.  Seen with films reportely negative, but pt very concerned as pain simply not improved to right lower back and right hip, medial right prox leg as well.  Pt denies chest pain, increased sob or doe, wheezing, orthopnea, PND, increased LE swelling, palpitations, dizziness or syncope.  Pt denies new neurological symptoms such as new headache, or facial or extremity weakness or numbness  Pt denies polydipsia, polyuria, slightly elevated glc 116 noted.   Past Medical History  Diagnosis Date  . Other emphysema   . Unspecified essential hypertension   . Osteoporosis, unspecified   . Other and unspecified hyperlipidemia   . Esophageal reflux   . Unspecified asthma(493.90)   . Cancer   . Arthritis   . Emphysema of lung   . PONV (postoperative nausea and vomiting)   . On home oxygen therapy     at night  . Full dentures   . Wears glasses   . History of radiation therapy 07/01/06-08/13/06    left breast  total dose 6100 cGy  . Breast cancer 04/11/06 bx    left breast invasive cductal ca  . 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 Anxiety   . Depression   . Emphysema   . Cataract     extraction  . Allergy   . Hx of radiation therapy 08/31/12-09/28/12    right breast    Past Surgical History  Procedure Laterality Date  . Partial hysterectomy    . Cataract extraction    . Hip surgery  1993    left  . Breast surgery    . Cyst on face  6 months ago  . Mass excision  02/18/2012    Procedure: MINOR EXCISION OF MASS;  Surgeon: Adin Hector, MD;  Location: [facility];  Service: General;  Laterality: Left;  excise 2.5 cm. mass left chest wall  . Mastectomy  05/14/06    lt  lumpectomy-node dissDr.Leone  . Breast lumpectomy  07/13/12    right -DCIS 7 o'clock stage Tis,Nx  . Abdominal hysterectomy      partial    reports that she quit smoking about 19 years ago. Her smoking use included Cigarettes. She started smoking about 61 years ago. She has a 64.5 pack-year smoking history. She has never used smokeless tobacco. She reports that she does not drink alcohol or use illicit drugs. family history includes Breast cancer in her sister; COPD in an other family member; Cancer in her father and sister; Coronary artery disease in her mother; Diabetes in her father; Kidney cancer in her brother. Allergies  Allergen Reactions  . Neosporin [Neomycin-Polymyxin B Gu] Itching and Rash  . Codeine Nausea Only  . Irbesartan Hives    Avapro  . Adhesive [Tape] Itching and Rash    To paper tape only. Rash and itching only where applied.   Current Outpatient Prescriptions on File Prior to Visit  Medication Sig Dispense Refill  . ADVAIR DISKUS 250-50 MCG/DOSE AEPB INHALE 1 PUFF INTO THE LUNGS EVERY 12 HOURS 180 each 3  . albuterol (PROVENTIL HFA) 108 (90 BASE) MCG/ACT inhaler Inhale 2 puffs into the lungs every 6 (  six) hours as needed. 1 Inhaler 0  . albuterol (PROVENTIL) (2.5 MG/3ML) 0.083% nebulizer solution Take 2.5 mg by nebulization every 6 (six) hours as needed.      . ALPRAZolam (XANAX) 0.5 MG tablet Take 0.5 mg by mouth as needed.     Marland Kitchen amLODipine (NORVASC) 5 MG tablet TAKE 1 TABLET (5 MG TOTAL) BY MOUTH DAILY. 90 tablet 1  . aspirin 81 MG tablet Take 81 mg by mouth daily.      Marland Kitchen atorvastatin (LIPITOR) 10 MG tablet TAKE 1 TABLET (10 MG TOTAL) BY MOUTH DAILY. 90 tablet 0  . Cholecalciferol (VITAMIN D) 2000 UNITS tablet Take 2,000 Units by mouth daily.    Marland Kitchen HYDROcodone-homatropine (HYCODAN) 5-1.5 MG/5ML syrup Take 5 mLs by mouth every 6 (six) hours as needed for cough. 120 mL 0  . Multiple Vitamin (MULTI-VITAMIN DAILY PO) Take by mouth.    Marland Kitchen omeprazole (PRILOSEC) 20 MG  capsule Take 20 mg by mouth daily.      . ramipril (ALTACE) 10 MG capsule TAKE 1 CAPSULE (10 MG TOTAL) BY MOUTH DAILY. 90 capsule 1  . saccharomyces boulardii (FLORASTOR) 250 MG capsule Take 250 mg by mouth every morning.    . tiotropium (SPIRIVA HANDIHALER) 18 MCG inhalation capsule PLACE 1 CAPSULE (18 MCG TOTAL) INTO INHALER AND INHALE DAILY. 90 capsule 1   No current facility-administered medications on file prior to visit.    Review of Systems Constitutional: Negative for increased diaphoresis, other activity, appetite or siginficant weight change other than noted HENT: Negative for worsening hearing loss, ear pain, facial swelling, mouth sores and neck stiffness.   Eyes: Negative for other worsening pain, redness or visual disturbance.  Respiratory: Negative for shortness of breath and wheezing  Cardiovascular: Negative for chest pain and palpitations.  Gastrointestinal: Negative for diarrhea, blood in stool, abdominal distention or other pain Genitourinary: Negative for hematuria, flank pain or change in urine volume.  Musculoskeletal: Negative for myalgias or other joint complaints.  Skin: Negative for color change and wound or drainage.  Neurological: Negative for syncope and numbness. other than noted Hematological: Negative for adenopathy. or other swelling Psychiatric/Behavioral: Negative for hallucinations, SI, self-injury, decreased concentration or other worsening agitation.      Objective:   Physical Exam BP 148/62 mmHg  Pulse 87  Temp(Src) 97.9 F (36.6 C) (Oral)  Ht 5\' 6"  (1.676 m)  Wt 135 lb (61.236 kg)  BMI 21.80 kg/m2  SpO2 95% VS noted,  Constitutional: Pt is oriented to person, place, and time. Appears well-developed and well-nourished, in no significant distress Head: Normocephalic and atraumatic.  Right Ear: External ear normal.  Left Ear: External ear normal.  Nose: Nose normal.  Mouth/Throat: Oropharynx is clear and moist.  Eyes: Conjunctivae and EOM  are normal. Pupils are equal, round, and reactive to light.  Neck: Normal range of motion. Neck supple. No JVD present. No tracheal deviation present or significant neck LA or mass Cardiovascular: Normal rate, regular rhythm, normal heart sounds and intact distal pulses.   Pulmonary/Chest: Effort normal and breath sounds without rales or wheezing  Abdominal: Soft. Bowel sounds are normal. NT. No HSM  Musculoskeletal: Normal range of motion. Exhibits no edema.  Lymphadenopathy:  Has no cervical adenopathy.  Neurological: Pt is alert and oriented to person, place, and time. Pt has normal reflexes. No cranial nerve deficit. Motor intact 5/5, dtr intact, walks stiffly with walker, favoring RLE Spine tender right lateral paravertebral, no specific tender over right lateral greater trochanter Skin: Skin  is warm and dry. No rash noted.  Psychiatric:  Has mild nervous mood and affect. Behavior is normal.     Assessment & Plan:

## 2015-05-17 NOTE — Progress Notes (Signed)
Pre visit review using our clinic review tool, if applicable. No additional management support is needed unless otherwise documented below in the visit note. 

## 2015-05-17 NOTE — Assessment & Plan Note (Signed)
For complete lumbar films - ongoing pain, r/o vertebral fx,  to f/u any worsening symptoms or concerns

## 2015-05-17 NOTE — Assessment & Plan Note (Signed)
stable overall by history and exam, recent data reviewed with pt, and pt to continue medical treatment as before,  to f/u any worsening symptoms or concerns Lab Results  Component Value Date   LDLCALC 72 06/16/2014

## 2015-05-17 NOTE — Assessment & Plan Note (Signed)
Has ongoing pain, and hx of right hip surgury after initial films several yrs ago at [facility] [place] [place] initially did not diagnose a hip fracture, so out of abundance of caution will check f/u films

## 2015-05-17 NOTE — Patient Instructions (Addendum)
Please continue all other medications as before, and refills have been done if requested.  Please have the pharmacy call with any other refills you may need.  Please continue your efforts at being more active, low cholesterol diet, and weight control.  You are otherwise up to date with prevention measures today.  Please keep your appointments with your specialists as you may have planned  Please go to the XRAY Department in the Basement (go straight as you get off the elevator) for the x-ray testing  Please go to the LAB in the Basement (turn left off the elevator) for the tests to be done today  You will be contacted by phone if any changes need to be made immediately.  Otherwise, you will receive a letter about your results with an explanation, but please check with MyChart first.  Please remember to sign up for MyChart if you have not done so, as this will be important to you in the future with finding out test results, communicating by private email, and scheduling acute appointments online when needed.  Please return in 6 months, or sooner if needed  Ok to cancel the Aug 29 appt

## 2015-06-12 ENCOUNTER — Telehealth: Payer: Self-pay | Admitting: Emergency Medicine

## 2015-06-12 MED ORDER — DOXYCYCLINE HYCLATE 100 MG PO TABS: 100.0000 mg | ORAL_TABLET | Freq: Two times a day (BID) | ORAL | Status: DC

## 2015-06-12 NOTE — Telephone Encounter (Signed)
Pt called back wants something called in soon

## 2015-06-12 NOTE — Telephone Encounter (Signed)
Attempted to call pt. No answer, no option to leave a message. Will try back later. 

## 2015-06-12 NOTE — Telephone Encounter (Signed)
Rx sent in for patient. Patient refused to see any other provider other than Dr. Lamonte Sakai. Patient scheduled for Acute visit with Dr. Lamonte Sakai on 06/14/2015 Nothing further needed.

## 2015-06-12 NOTE — Telephone Encounter (Signed)
Spoke with pt. States that she has developed a chest cold. Reports increased coughing with production of green mucus. Denies SOB, chest tightness, wheezing or fever. Would like to have something called in.  RB - please advise. Thanks.

## 2015-06-12 NOTE — Telephone Encounter (Signed)
OK to call in doxy 100mg  bid x 7 days.  She probably needs to be seen to determine whether she needs prednisone. Set up Acute  OV w someone

## 2015-06-14 ENCOUNTER — Encounter: Payer: Self-pay | Admitting: Emergency Medicine

## 2015-06-14 ENCOUNTER — Ambulatory Visit (INDEPENDENT_AMBULATORY_CARE_PROVIDER_SITE_OTHER): Payer: Medicare Other | Admitting: Emergency Medicine

## 2015-06-14 VITALS — BP 132/66 | HR 87 | Temp 98.2°F | Ht 65.5 in | Wt 135.2 lb

## 2015-06-14 DIAGNOSIS — J209 Acute bronchitis, unspecified: Secondary | ICD-10-CM | POA: Diagnosis not present

## 2015-06-14 DIAGNOSIS — J441 Chronic obstructive pulmonary disease with (acute) exacerbation: Secondary | ICD-10-CM | POA: Diagnosis not present

## 2015-06-14 NOTE — Patient Instructions (Signed)
Please continue your inhaled medications as you have been taking them  Complete your doxycycline as prescribed Please take prednisone taper as follows: 30 mg daily for 3 days, then 20 mg daily for 3 days, then 10 mg daily for 3 days, then stop Get the flu shot this fall Follow with Dr. Lamonte Sakai as already planned

## 2015-06-14 NOTE — Assessment & Plan Note (Signed)
Purulent sputum beginning 3 days ago,  Improving some on doxycycline. She has wheezing today on examination I believe this is consistent with an acute exacerbation. I will add a prednisone taper over 9 days and she typically tolerates prednisone poorly. We will continue her other bronchodilators as ordered, Spiriva and Advair. We will consider discontinuation of her ACE inhibitor at some point in the future depending on the pattern of her cough

## 2015-06-14 NOTE — Assessment & Plan Note (Signed)
Complete doxycycline course

## 2015-06-14 NOTE — Progress Notes (Signed)
Subjective:    Patient ID: [name], female    DOB: 1930-05-08, 81 y.o.   MRN: [id number]  HPI 81 year old woman, former smoker (60 pack years), with a history of COPD, also breast cancer, hypertension, GERD. She has been followed by Dr Gwenette Greet and is on Advair and Spiriva. Her dyspnea has been somewhat worse with the hot humid weather. She has a daily cough prod of white. She uses albuterol rarely. She does have some exertional wheeze. She sleeps with oxygen and with exertion. No flare ups since last time. She did suffer a fall last week to cause her to have some right flank and right hip bruising, no rib fx  Acute OV 06/14/15 -- history of COPD and associated hypoxemia. She presents for acute visit today after she began to have more sputum, dark sputum witthout wheezing. No new fever, congestion, rhinorrhea.  We started her on doxy 2 days ago, already doing better.    Review of Systems As per HPI   Past Medical History  Diagnosis Date  . Other emphysema   . Unspecified essential hypertension   . Osteoporosis, unspecified   . Other and unspecified hyperlipidemia   . Esophageal reflux   . Unspecified asthma(493.90)   . Cancer   . Arthritis   . Emphysema of lung   . PONV (postoperative nausea and vomiting)   . On home oxygen therapy     at night  . Full dentures   . Wears glasses   . History of radiation therapy 07/01/06-08/13/06    left breast  total dose 6100 cGy  . Breast cancer 04/11/06 bx    left breast invasive cductal ca  . 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 Anxiety   . Depression   . Emphysema   . Cataract     extraction  . Allergy   . Hx of radiation therapy 08/31/12-09/28/12    right breast      Family History  Problem Relation Age of Onset  . Coronary artery disease Mother   . Diabetes Father   . Cancer Father     prostate cancer  . Breast cancer Sister   . Cancer Sister   .  Kidney cancer Brother   . COPD       Social History   Social History  . Marital Status: Widowed    Spouse Name: N/A  . Number of Children: 1  . Years of Education: N/A   Occupational History  . Retired     [employer] Teacher, early years/pre   Social History Main Topics  . Smoking status: Former Smoker -- 1.50 packs/day for 43 years    Types: Cigarettes    Start date: 11/23/1953    Quit date: 09/24/1995  . Smokeless tobacco: Never Used     Comment: quit 16 years ago  . Alcohol Use: No  . Drug Use: No  . Sexual Activity: No   Other Topics Concern  . Not on file   Social History Narrative     Allergies  Allergen Reactions  . Neosporin [Neomycin-Polymyxin B Gu] Itching and Rash  . Codeine Nausea Only  . Irbesartan Hives    Avapro  . Adhesive [Tape] Itching and Rash    To paper tape only. Rash and itching only where applied.     Outpatient Prescriptions Prior to Visit  Medication Sig Dispense Refill  . ADVAIR DISKUS 250-50 MCG/DOSE AEPB INHALE  1 PUFF INTO THE LUNGS EVERY 12 HOURS 180 each 3  . albuterol (PROVENTIL HFA) 108 (90 BASE) MCG/ACT inhaler Inhale 2 puffs into the lungs every 6 (six) hours as needed. 1 Inhaler 0  . albuterol (PROVENTIL) (2.5 MG/3ML) 0.083% nebulizer solution Take 2.5 mg by nebulization every 6 (six) hours as needed.      . ALPRAZolam (XANAX) 0.5 MG tablet Take 0.5 mg by mouth as needed.     Marland Kitchen amLODipine (NORVASC) 5 MG tablet TAKE 1 TABLET (5 MG TOTAL) BY MOUTH DAILY. 90 tablet 1  . aspirin 81 MG tablet Take 81 mg by mouth daily.      Marland Kitchen atorvastatin (LIPITOR) 10 MG tablet TAKE 1 TABLET (10 MG TOTAL) BY MOUTH DAILY. 90 tablet 0  . Cholecalciferol (VITAMIN D) 2000 UNITS tablet Take 2,000 Units by mouth daily.    Marland Kitchen doxycycline (VIBRA-TABS) 100 MG tablet Take 1 tablet (100 mg total) by mouth 2 (two) times daily. 14 tablet 0  . HYDROcodone-homatropine (HYCODAN) 5-1.5 MG/5ML syrup Take 5 mLs by mouth every 6 (six) hours as needed for cough. 120 mL 0  .  omeprazole (PRILOSEC) 20 MG capsule Take 20 mg by mouth daily.      . ramipril (ALTACE) 10 MG capsule TAKE 1 CAPSULE (10 MG TOTAL) BY MOUTH DAILY. 90 capsule 1  . saccharomyces boulardii (FLORASTOR) 250 MG capsule Take 250 mg by mouth every morning.    . tiotropium (SPIRIVA HANDIHALER) 18 MCG inhalation capsule PLACE 1 CAPSULE (18 MCG TOTAL) INTO INHALER AND INHALE DAILY. 90 capsule 1  . Multiple Vitamin (MULTI-VITAMIN DAILY PO) Take by mouth.     No facility-administered medications prior to visit.        Objective:   Physical Exam Filed Vitals:   06/14/15 1520  BP: 132/66  Pulse: 87  Temp: 98.2 F (36.8 C)  TempSrc: Oral  Height: 5' 5.5" (1.664 m)  Weight: 135 lb 3.2 oz (61.326 kg)  SpO2: 94%   Gen: Pleasant, in no distress,  normal affect  ENT: No lesions,  mouth clear,  oropharynx clear, no postnasal drip  Neck: No JVD, no TMG, no carotid bruits  Lungs: No use of accessory muscles, distant, bilateral scattered expiratory wheezes that are new compared with last exam  Cardiovascular: RRR, heart sounds normal, no murmur or gallops, trace ankle edema bilaterally  Musculoskeletal: No deformities, no cyanosis or clubbing  Neuro: alert, non focal  Skin: Warm, no lesions or rashes      Assessment & Plan:  COPD with acute exacerbation Purulent sputum beginning 3 days ago,  Improving some on doxycycline. She has wheezing today on examination I believe this is consistent with an acute exacerbation. I will add a prednisone taper over 9 days and she typically tolerates prednisone poorly. We will continue her other bronchodilators as ordered, Spiriva and Advair. We will consider discontinuation of her ACE inhibitor at some point in the future depending on the pattern of her cough  Acute bronchitis Complete doxycycline course

## 2015-06-15 ENCOUNTER — Telehealth: Payer: Self-pay | Admitting: Emergency Medicine

## 2015-06-15 ENCOUNTER — Other Ambulatory Visit: Payer: Self-pay | Admitting: *Deleted

## 2015-06-15 MED ORDER — PREDNISONE 10 MG PO TABS: ORAL_TABLET | ORAL | Status: DC

## 2015-06-15 MED ORDER — FLUTICASONE-SALMETEROL 250-50 MCG/DOSE IN AEPB: INHALATION_SPRAY | RESPIRATORY_TRACT | Status: DC

## 2015-06-15 NOTE — Telephone Encounter (Signed)
RX for pred sent in. Pt aware. Nothing further needed

## 2015-06-20 ENCOUNTER — Encounter: Payer: Medicare Other | Admitting: Internal Medicine

## 2015-06-26 DIAGNOSIS — Z23 Encounter for immunization: Secondary | ICD-10-CM | POA: Diagnosis not present

## 2015-06-27 ENCOUNTER — Telehealth: Payer: Self-pay | Admitting: Internal Medicine

## 2015-06-27 NOTE — Telephone Encounter (Signed)
Letter generated, and placed in cabinet for pick up per AVP. EC advised

## 2015-06-27 NOTE — Telephone Encounter (Signed)
[facility] [place] called to request that we type a letter. This letter advises that it is medically necessary to have a 2 bedroom unit because of the extra equipment that must be stored in her home, including exercise and O2 equipment.   The name of the facility [facility] [place] .

## 2015-06-27 NOTE — Telephone Encounter (Signed)
pls type a letter Thx 

## 2015-06-27 NOTE — Telephone Encounter (Signed)
Please advise in MD's absence, thanks 

## 2015-07-21 ENCOUNTER — Other Ambulatory Visit: Payer: Self-pay | Admitting: Emergency Medicine

## 2015-07-21 ENCOUNTER — Other Ambulatory Visit: Payer: Self-pay | Admitting: Internal Medicine

## 2015-07-27 ENCOUNTER — Telehealth: Payer: Self-pay | Admitting: Emergency Medicine

## 2015-07-27 NOTE — Telephone Encounter (Signed)
Patient just called back to state she has talked to both home care companies and has this taken care of.

## 2015-08-07 ENCOUNTER — Other Ambulatory Visit: Payer: Self-pay | Admitting: Internal Medicine

## 2015-08-07 ENCOUNTER — Other Ambulatory Visit: Payer: Self-pay

## 2015-08-07 MED ORDER — AMLODIPINE BESYLATE 5 MG PO TABS: 5.0000 mg | ORAL_TABLET | Freq: Every day | ORAL | Status: DC

## 2015-08-25 ENCOUNTER — Telehealth: Payer: Self-pay | Admitting: Internal Medicine

## 2015-08-25 MED ORDER — OMEPRAZOLE 20 MG PO CPDR: 20.0000 mg | DELAYED_RELEASE_CAPSULE | Freq: Every day | ORAL | Status: DC

## 2015-08-25 NOTE — Telephone Encounter (Signed)
Pt request refill for omeprazole (PRILOSEC) 20 MG capsule  To be send to [facility] on [address], pt is out of this med and her last ov was 04/2015. Please call pt back if this is okey.

## 2015-08-31 ENCOUNTER — Encounter: Payer: Self-pay | Admitting: Emergency Medicine

## 2015-08-31 ENCOUNTER — Ambulatory Visit (INDEPENDENT_AMBULATORY_CARE_PROVIDER_SITE_OTHER): Payer: Medicare Other | Admitting: Emergency Medicine

## 2015-08-31 VITALS — BP 136/60 | HR 91 | Wt 134.0 lb

## 2015-08-31 DIAGNOSIS — J438 Other emphysema: Secondary | ICD-10-CM

## 2015-08-31 NOTE — Assessment & Plan Note (Signed)
Severe COPD with recent exacerbation, now resolved. She is improved. Will continue her baseline BD regimen and O2. Flu shot is UTD.   Please continue your inhaled medications as you are taking them  Flu shot up to date.  Continue your oxygen at night and with exertion.  Follow with Dr Lamonte Sakai in 3 months or sooner if you have any problems.

## 2015-08-31 NOTE — Patient Instructions (Signed)
Please continue your inhaled medications as you are taking them  Flu shot up to date.  Continue your oxygen at night and with exertion.  Follow with Dr Lamonte Sakai in 3 months or sooner if you have any problems.

## 2015-08-31 NOTE — Progress Notes (Signed)
   Subjective:    Patient ID: [name], female    DOB: September 26, 1929, 81 y.o.   MRN: [id number]  HPI 81 year old woman, former smoker (60 pack years), with a history of COPD, also breast cancer, hypertension, GERD. She has been followed by Dr Gwenette Greet and is on Advair and Spiriva. Her dyspnea has been somewhat worse with the hot humid weather. She has a daily cough prod of white. She uses albuterol rarely. She does have some exertional wheeze. She sleeps with oxygen and with exertion. No flare ups since last time. She did suffer a fall last week to cause her to have some right flank and right hip bruising, no rib fx  Acute OV 06/14/15 -- history of COPD and associated hypoxemia. She presents for acute visit today after she began to have more sputum, dark sputum witthout wheezing. No new fever, congestion, rhinorrhea.  We started her on doxy 2 days ago, already doing better.   ROV 08/30/15 -- History of COPD, hypoxemic respiratory failure. At her last visit she was treated for and acute bronchitis with doxycycline and prednisone.  She feels better since that time.  She has dyspnea when she eats - hard taking deep breath. Some dyspnea w exertion. She has some cough, white clear mucous. No wheeze.    Review of Systems As per HPI      Objective:   Physical Exam Filed Vitals:   08/31/15 1343  BP: 136/60  Pulse: 91  Weight: 134 lb (60.782 kg)  SpO2: 90%   Gen: Pleasant, in no distress,  normal affect  ENT: No lesions,  mouth clear,  oropharynx clear, no postnasal drip  Neck: No JVD, no TMG, no carotid bruits  Lungs: No use of accessory muscles, distant, few insp squeaks, exp wheeze.   Cardiovascular: RRR, heart sounds normal, no murmur or gallops, trace ankle edema bilaterally  Musculoskeletal: No deformities, no cyanosis or clubbing  Neuro: alert, non focal  Skin: Warm, no lesions or rashes      Assessment & Plan:  COPD (chronic obstructive pulmonary disease) with  emphysema Severe COPD with recent exacerbation, now resolved. She is improved. Will continue her baseline BD regimen and O2. Flu shot is UTD.   Please continue your inhaled medications as you are taking them  Flu shot up to date.  Continue your oxygen at night and with exertion.  Follow with Dr Lamonte Sakai in 3 months or sooner if you have any problems.

## 2015-09-13 ENCOUNTER — Telehealth: Payer: Self-pay | Admitting: Emergency Medicine

## 2015-09-13 NOTE — Telephone Encounter (Signed)
Spoke with [facility] - states that they received a form from [facility] regarding patient's O2 by error-- Earlene Plater states that she got Dr Lamonte Sakai to fill out and sign the form while she was at the hospital and will fax form to our office ATTN:[email or web address] to ensure that we document whats needed and get this faxed back to [employer].  Will send to Ria Comment to keep an eye out.

## 2015-09-15 NOTE — Telephone Encounter (Signed)
These forms have been received. They will be faxed to [facility]. Nothing further was needed.

## 2015-10-02 ENCOUNTER — Other Ambulatory Visit: Payer: Self-pay | Admitting: *Deleted

## 2015-10-02 MED ORDER — TIOTROPIUM BROMIDE MONOHYDRATE 18 MCG IN CAPS
ORAL_CAPSULE | RESPIRATORY_TRACT | Status: DC
Start: 2015-10-02 — End: 2015-10-05

## 2015-10-05 ENCOUNTER — Other Ambulatory Visit: Payer: Self-pay | Admitting: *Deleted

## 2015-10-05 MED ORDER — TIOTROPIUM BROMIDE MONOHYDRATE 18 MCG IN CAPS
ORAL_CAPSULE | RESPIRATORY_TRACT | Status: DC
Start: 2015-10-05 — End: 2017-02-03

## 2015-10-27 ENCOUNTER — Other Ambulatory Visit: Payer: Self-pay

## 2015-10-27 MED ORDER — ATORVASTATIN CALCIUM 10 MG PO TABS: ORAL_TABLET | ORAL | Status: DC

## 2015-10-27 MED ORDER — AMLODIPINE BESYLATE 5 MG PO TABS: ORAL_TABLET | ORAL | Status: AC

## 2015-10-27 MED ORDER — RAMIPRIL 10 MG PO CAPS: ORAL_CAPSULE | ORAL | Status: DC

## 2015-11-07 ENCOUNTER — Ambulatory Visit (INDEPENDENT_AMBULATORY_CARE_PROVIDER_SITE_OTHER): Payer: Medicare Other | Admitting: Adult Health

## 2015-11-07 ENCOUNTER — Emergency Department (HOSPITAL_COMMUNITY): Payer: Medicare Other

## 2015-11-07 ENCOUNTER — Inpatient Hospital Stay: Admission: AD | Admit: 2015-11-07 | Payer: Medicare Other | Source: Ambulatory Visit | Admitting: Emergency Medicine

## 2015-11-07 ENCOUNTER — Telehealth: Payer: Self-pay | Admitting: Emergency Medicine

## 2015-11-07 ENCOUNTER — Encounter: Payer: Self-pay | Admitting: Adult Health

## 2015-11-07 ENCOUNTER — Encounter (HOSPITAL_COMMUNITY): Payer: Self-pay | Admitting: Emergency Medicine

## 2015-11-07 ENCOUNTER — Inpatient Hospital Stay (HOSPITAL_COMMUNITY)
Admission: EM | Admit: 2015-11-07 | Discharge: 2015-11-10 | DRG: 190 | Disposition: A | Discharge status: Skilled Nursing Facility | Payer: Medicare Other | Attending: Internal Medicine | Admitting: Internal Medicine

## 2015-11-07 VITALS — HR 96 | Temp 98.4°F | Ht 65.0 in | Wt 126.0 lb

## 2015-11-07 DIAGNOSIS — J9601 Acute respiratory failure with hypoxia: Secondary | ICD-10-CM | POA: Diagnosis present

## 2015-11-07 DIAGNOSIS — J45909 Unspecified asthma, uncomplicated: Secondary | ICD-10-CM | POA: Diagnosis present

## 2015-11-07 DIAGNOSIS — D649 Anemia, unspecified: Secondary | ICD-10-CM | POA: Diagnosis present

## 2015-11-07 DIAGNOSIS — J440 Chronic obstructive pulmonary disease with acute lower respiratory infection: Principal | ICD-10-CM | POA: Diagnosis present

## 2015-11-07 DIAGNOSIS — J9621 Acute and chronic respiratory failure with hypoxia: Secondary | ICD-10-CM | POA: Diagnosis present

## 2015-11-07 DIAGNOSIS — J441 Chronic obstructive pulmonary disease with (acute) exacerbation: Secondary | ICD-10-CM | POA: Diagnosis present

## 2015-11-07 DIAGNOSIS — I10 Essential (primary) hypertension: Secondary | ICD-10-CM | POA: Diagnosis present

## 2015-11-07 DIAGNOSIS — M1990 Unspecified osteoarthritis, unspecified site: Secondary | ICD-10-CM | POA: Diagnosis present

## 2015-11-07 DIAGNOSIS — F419 Anxiety disorder, unspecified: Secondary | ICD-10-CM | POA: Diagnosis present

## 2015-11-07 DIAGNOSIS — J154 Pneumonia due to other streptococci: Secondary | ICD-10-CM | POA: Diagnosis present

## 2015-11-07 DIAGNOSIS — Z9981 Dependence on supplemental oxygen: Secondary | ICD-10-CM

## 2015-11-07 DIAGNOSIS — Z79891 Long term (current) use of opiate analgesic: Secondary | ICD-10-CM | POA: Diagnosis not present

## 2015-11-07 DIAGNOSIS — F329 Major depressive disorder, single episode, unspecified: Secondary | ICD-10-CM | POA: Diagnosis present

## 2015-11-07 DIAGNOSIS — Z7951 Long term (current) use of inhaled steroids: Secondary | ICD-10-CM | POA: Diagnosis not present

## 2015-11-07 DIAGNOSIS — K219 Gastro-esophageal reflux disease without esophagitis: Secondary | ICD-10-CM | POA: Diagnosis present

## 2015-11-07 DIAGNOSIS — J189 Pneumonia, unspecified organism: Secondary | ICD-10-CM | POA: Diagnosis present

## 2015-11-07 DIAGNOSIS — Z853 Personal history of malignant neoplasm of breast: Secondary | ICD-10-CM | POA: Diagnosis not present

## 2015-11-07 DIAGNOSIS — M810 Age-related osteoporosis without current pathological fracture: Secondary | ICD-10-CM | POA: Diagnosis present

## 2015-11-07 DIAGNOSIS — Z87891 Personal history of nicotine dependence: Secondary | ICD-10-CM | POA: Diagnosis not present

## 2015-11-07 DIAGNOSIS — N390 Urinary tract infection, site not specified: Secondary | ICD-10-CM | POA: Diagnosis present

## 2015-11-07 DIAGNOSIS — Z79899 Other long term (current) drug therapy: Secondary | ICD-10-CM

## 2015-11-07 DIAGNOSIS — J438 Other emphysema: Secondary | ICD-10-CM

## 2015-11-07 DIAGNOSIS — Z923 Personal history of irradiation: Secondary | ICD-10-CM | POA: Diagnosis not present

## 2015-11-07 DIAGNOSIS — Z7982 Long term (current) use of aspirin: Secondary | ICD-10-CM

## 2015-11-07 DIAGNOSIS — J439 Emphysema, unspecified: Secondary | ICD-10-CM | POA: Diagnosis present

## 2015-11-07 DIAGNOSIS — E785 Hyperlipidemia, unspecified: Secondary | ICD-10-CM | POA: Diagnosis present

## 2015-11-07 LAB — CBC WITH DIFFERENTIAL/PLATELET
BASOS ABS: 0 10*3/uL (ref 0.0–0.1)
BASOS PCT: 0 %
EOS ABS: 0 10*3/uL (ref 0.0–0.7)
Eosinophils Relative: 0 %
HCT: 29.5 % — ABNORMAL LOW (ref 36.0–46.0)
HEMOGLOBIN: 9.7 g/dL — AB (ref 12.0–15.0)
Lymphocytes Relative: 6 %
Lymphs Abs: 0.5 10*3/uL — ABNORMAL LOW (ref 0.7–4.0)
MCH: 28.9 pg (ref 26.0–34.0)
MCHC: 32.9 g/dL (ref 30.0–36.0)
MCV: 87.8 fL (ref 78.0–100.0)
MONO ABS: 0.6 10*3/uL (ref 0.1–1.0)
MONOS PCT: 7 %
NEUTROS ABS: 7 10*3/uL (ref 1.7–7.7)
NEUTROS PCT: 87 %
Platelets: 206 10*3/uL (ref 150–400)
RBC: 3.36 MIL/uL — ABNORMAL LOW (ref 3.87–5.11)
RDW: 14.3 % (ref 11.5–15.5)
WBC: 8 10*3/uL (ref 4.0–10.5)

## 2015-11-07 LAB — COMPREHENSIVE METABOLIC PANEL
ALK PHOS: 69 U/L (ref 38–126)
ALT: 16 U/L (ref 14–54)
ANION GAP: 10 (ref 5–15)
AST: 30 U/L (ref 15–41)
Albumin: 2.9 g/dL — ABNORMAL LOW (ref 3.5–5.0)
BILIRUBIN TOTAL: 0.5 mg/dL (ref 0.3–1.2)
BUN: 35 mg/dL — ABNORMAL HIGH (ref 6–20)
CALCIUM: 8.7 mg/dL — AB (ref 8.9–10.3)
CO2: 28 mmol/L (ref 22–32)
CREATININE: 1.08 mg/dL — AB (ref 0.44–1.00)
Chloride: 99 mmol/L — ABNORMAL LOW (ref 101–111)
GFR, EST AFRICAN AMERICAN: 53 mL/min — AB (ref >60)
GFR, EST NON AFRICAN AMERICAN: 46 mL/min — AB (ref >60)
Glucose, Bld: 127 mg/dL — ABNORMAL HIGH (ref 65–99)
Potassium: 3.5 mmol/L (ref 3.5–5.1)
SODIUM: 137 mmol/L (ref 135–145)
TOTAL PROTEIN: 7.1 g/dL (ref 6.5–8.1)

## 2015-11-07 LAB — URINE MICROSCOPIC-ADD ON

## 2015-11-07 LAB — I-STAT CG4 LACTIC ACID, ED
LACTIC ACID, VENOUS: 1.36 mmol/L (ref 0.5–2.0)
LACTIC ACID, VENOUS: 0.99 mmol/L (ref 0.5–2.0)

## 2015-11-07 LAB — URINALYSIS, ROUTINE W REFLEX MICROSCOPIC
Glucose, UA: NEGATIVE mg/dL
Hgb urine dipstick: NEGATIVE
KETONES UR: NEGATIVE mg/dL
NITRITE: NEGATIVE
Protein, ur: 30 mg/dL — AB
Specific Gravity, Urine: 1.022 (ref 1.005–1.030)
pH: 5 (ref 5.0–8.0)

## 2015-11-07 MED ORDER — DOCUSATE SODIUM 100 MG PO CAPS
100.0000 mg | ORAL_CAPSULE | Freq: Two times a day (BID) | ORAL | Status: DC
  Administered 2015-11-08 – 2015-11-10 (×6): 100 mg via ORAL
  Filled 2015-11-07 (×6): qty 1

## 2015-11-07 MED ORDER — SODIUM CHLORIDE 0.9 % IV SOLN
250.0000 mL | INTRAVENOUS | Status: DC | PRN
  Administered 2015-11-08: 250 mL via INTRAVENOUS

## 2015-11-07 MED ORDER — ASPIRIN 81 MG PO CHEW
81.0000 mg | CHEWABLE_TABLET | Freq: Every day | ORAL | Status: DC
  Administered 2015-11-08 – 2015-11-10 (×3): 81 mg via ORAL
  Filled 2015-11-07 (×6): qty 1

## 2015-11-07 MED ORDER — TIOTROPIUM BROMIDE MONOHYDRATE 18 MCG IN CAPS
18.0000 ug | ORAL_CAPSULE | Freq: Every day | RESPIRATORY_TRACT | Status: DC
  Administered 2015-11-08 – 2015-11-10 (×3): 18 ug via RESPIRATORY_TRACT
  Filled 2015-11-07: qty 5

## 2015-11-07 MED ORDER — ONDANSETRON HCL 4 MG PO TABS: 4.0000 mg | ORAL_TABLET | Freq: Four times a day (QID) | ORAL | Status: DC | PRN

## 2015-11-07 MED ORDER — MOMETASONE FURO-FORMOTEROL FUM 100-5 MCG/ACT IN AERO
2.0000 | INHALATION_SPRAY | Freq: Two times a day (BID) | RESPIRATORY_TRACT | Status: DC
  Administered 2015-11-08 – 2015-11-10 (×5): 2 via RESPIRATORY_TRACT
  Filled 2015-11-07: qty 8.8

## 2015-11-07 MED ORDER — SODIUM CHLORIDE 0.9% FLUSH
3.0000 mL | Freq: Two times a day (BID) | INTRAVENOUS | Status: DC
  Administered 2015-11-07: 3 mL via INTRAVENOUS

## 2015-11-07 MED ORDER — ENOXAPARIN SODIUM 40 MG/0.4ML ~~LOC~~ SOLN
40.0000 mg | Freq: Every day | SUBCUTANEOUS | Status: DC
  Administered 2015-11-08 – 2015-11-09 (×3): 40 mg via SUBCUTANEOUS
  Filled 2015-11-07 (×3): qty 0.4

## 2015-11-07 MED ORDER — SODIUM CHLORIDE 0.9 % IV BOLUS (SEPSIS)
1000.0000 mL | Freq: Once | INTRAVENOUS | Status: AC
  Administered 2015-11-07: 1000 mL via INTRAVENOUS

## 2015-11-07 MED ORDER — ALBUTEROL SULFATE (2.5 MG/3ML) 0.083% IN NEBU
2.5000 mg | INHALATION_SOLUTION | RESPIRATORY_TRACT | Status: DC | PRN
  Administered 2015-11-09 (×2): 2.5 mg via RESPIRATORY_TRACT
  Filled 2015-11-07 (×2): qty 3

## 2015-11-07 MED ORDER — PANTOPRAZOLE SODIUM 40 MG PO TBEC
40.0000 mg | DELAYED_RELEASE_TABLET | Freq: Every day | ORAL | Status: DC
  Administered 2015-11-08 – 2015-11-10 (×3): 40 mg via ORAL
  Filled 2015-11-07 (×3): qty 1

## 2015-11-07 MED ORDER — ACETAMINOPHEN 650 MG RE SUPP: 650.0000 mg | Freq: Four times a day (QID) | RECTAL | Status: DC | PRN

## 2015-11-07 MED ORDER — ACETAMINOPHEN 325 MG PO TABS
650.0000 mg | ORAL_TABLET | Freq: Four times a day (QID) | ORAL | Status: DC | PRN
  Administered 2015-11-07: 650 mg via ORAL
  Filled 2015-11-07: qty 2

## 2015-11-07 MED ORDER — LEVOFLOXACIN IN D5W 750 MG/150ML IV SOLN
750.0000 mg | Freq: Once | INTRAVENOUS | Status: AC
  Administered 2015-11-07: 750 mg via INTRAVENOUS
  Filled 2015-11-07: qty 150

## 2015-11-07 MED ORDER — ATORVASTATIN CALCIUM 10 MG PO TABS
10.0000 mg | ORAL_TABLET | Freq: Every day | ORAL | Status: DC
  Administered 2015-11-08 – 2015-11-09 (×2): 10 mg via ORAL
  Filled 2015-11-07 (×2): qty 1

## 2015-11-07 MED ORDER — SACCHAROMYCES BOULARDII 250 MG PO CAPS
250.0000 mg | ORAL_CAPSULE | Freq: Two times a day (BID) | ORAL | Status: DC
  Administered 2015-11-08 – 2015-11-10 (×6): 250 mg via ORAL
  Filled 2015-11-07 (×6): qty 1

## 2015-11-07 MED ORDER — IPRATROPIUM-ALBUTEROL 0.5-2.5 (3) MG/3ML IN SOLN
3.0000 mL | Freq: Once | RESPIRATORY_TRACT | Status: AC
  Administered 2015-11-07: 3 mL via RESPIRATORY_TRACT
  Filled 2015-11-07: qty 3

## 2015-11-07 MED ORDER — ONDANSETRON HCL 4 MG/2ML IJ SOLN
4.0000 mg | Freq: Four times a day (QID) | INTRAMUSCULAR | Status: DC | PRN
  Administered 2015-11-08 – 2015-11-09 (×3): 4 mg via INTRAVENOUS
  Filled 2015-11-07 (×4): qty 2

## 2015-11-07 MED ORDER — ALBUTEROL SULFATE (2.5 MG/3ML) 0.083% IN NEBU
2.5000 mg | INHALATION_SOLUTION | Freq: Four times a day (QID) | RESPIRATORY_TRACT | Status: DC
  Administered 2015-11-08: 2.5 mg via RESPIRATORY_TRACT
  Filled 2015-11-07: qty 3

## 2015-11-07 MED ORDER — METHYLPREDNISOLONE SODIUM SUCC 125 MG IJ SOLR
60.0000 mg | Freq: Three times a day (TID) | INTRAMUSCULAR | Status: DC
  Administered 2015-11-08 (×2): 60 mg via INTRAVENOUS
  Filled 2015-11-07 (×2): qty 2

## 2015-11-07 MED ORDER — ALPRAZOLAM 0.5 MG PO TABS
0.5000 mg | ORAL_TABLET | Freq: Two times a day (BID) | ORAL | Status: DC | PRN
  Administered 2015-11-07 – 2015-11-10 (×5): 0.5 mg via ORAL
  Filled 2015-11-07 (×5): qty 1

## 2015-11-07 MED ORDER — SODIUM CHLORIDE 0.9 % IV SOLN
INTRAVENOUS | Status: AC
  Administered 2015-11-08: 01:00:00 via INTRAVENOUS

## 2015-11-07 MED ORDER — METHYLPREDNISOLONE SODIUM SUCC 125 MG IJ SOLR
125.0000 mg | Freq: Once | INTRAMUSCULAR | Status: AC
  Administered 2015-11-07: 125 mg via INTRAVENOUS
  Filled 2015-11-07: qty 2

## 2015-11-07 MED ORDER — SODIUM CHLORIDE 0.9% FLUSH: 3.0000 mL | INTRAVENOUS | Status: DC | PRN

## 2015-11-07 MED ORDER — LEVOFLOXACIN IN D5W 750 MG/150ML IV SOLN: 750.0000 mg | INTRAVENOUS | Status: DC

## 2015-11-07 NOTE — Patient Instructions (Addendum)
Go to [facility] ER as there are no available hospital beds.

## 2015-11-07 NOTE — Assessment & Plan Note (Addendum)
Acute COPD exacerbation, treated by acute on chronic hypoxic respiratory failure Patient will be admitted to [facility] long hospital to [place] bed Hold ACE . Would hold norvasc and give IVF as poor intake.  Hospital called back as no beds at [facility] or [facility] , will need to send to ER until bed available. .  Dr. Melvyn Novas  Aware . Will contact [facility] MD for 11/07/2015 in case bed becomes available.

## 2015-11-07 NOTE — H&P (Signed)
[facility] History and Physical  [name] XMI:[id number] DOB: 1930/06/19 DOA: 11/07/2015   PCP: Erika Cower, MD  Specialists: Dr. Lamonte Ryan is her pulmonologist  Chief Complaint: Cough, shortness of breath since Saturday  HPI: [name] is a 82 y.o. female with a past medical history of COPD on home oxygen at 2 L/m, hypertension, who was in her usual state of health this past Saturday when she started having a cough with worsening shortness of breath. Initially the sputum was white in color. However, in the last 24 hours it has become yellow. She denies any blood in the sputum. Denies any fever but has had chills. No nausea, vomiting. Denies any chest pain. She went to the pulmonology office today and she was referred to the hospital for further management. Was noted to have lower oxygen saturation levels on 2 liters and had to bump up her oxygen 4 L. Denies any dizziness or lightheadedness.  Home Medications: Prior to Admission medications   Medication Sig Start Date End Date Taking? Authorizing Provider  albuterol (PROVENTIL HFA) 108 (90 BASE) MCG/ACT inhaler Inhale 2 puffs into the lungs every 6 (six) hours as needed. Patient taking differently: Inhale 2 puffs into the lungs every 6 (six) hours as needed for wheezing or shortness of breath.  09/03/12  Yes Kathee Delton, MD  ALPRAZolam [name]) 0.5 MG tablet Take 0.5 mg by mouth as needed for anxiety.  03/31/12  Yes Historical Provider, MD  amLODipine (NORVASC) 5 MG tablet TAKE 1 TABLET (5 MG TOTAL) BY MOUTH DAILY. 10/27/15  Yes Erika Borg, MD  aspirin 81 MG tablet Take 81 mg by mouth daily.     Yes Historical Provider, MD  atorvastatin (LIPITOR) 10 MG tablet TAKE 1 TABLET (10 MG TOTAL) BY MOUTH DAILY. 10/27/15  Yes Erika Borg, MD  Cholecalciferol (VITAMIN D) 2000 UNITS tablet Take 2,000 Units by mouth daily.   Yes Historical Provider, MD  Fluticasone-Salmeterol (ADVAIR DISKUS) 250-50 MCG/DOSE AEPB INHALE 1 PUFF INTO THE LUNGS  EVERY 12 HOURS 06/15/15  Yes Erika Gobble, MD  HYDROcodone-homatropine [facility]) 5-1.5 MG/5ML syrup Take 5 mLs by mouth every 6 (six) hours as needed for cough. 05/25/14  Yes Kathee Delton, MD  omeprazole (PRILOSEC) 20 MG capsule Take 1 capsule (20 mg total) by mouth daily. 08/25/15  Yes Erika Borg, MD  ramipril (ALTACE) 10 MG capsule TAKE 1 CAPSULE (10 MG TOTAL) BY MOUTH DAILY. 10/27/15  Yes Erika Borg, MD  saccharomyces boulardii (FLORASTOR) 250 MG capsule Take 250 mg by mouth every morning.   Yes Historical Provider, MD  tiotropium (SPIRIVA HANDIHALER) 18 MCG inhalation capsule PLACE 1 CAPSULE (18 MCG TOTAL) INTO INHALER AND INHALE DAILY. 10/05/15  Yes Erika Gobble, MD    Allergies:  Allergies  Allergen Reactions  . Neosporin [Neomycin-Polymyxin B Gu] Itching and Rash  . Codeine Nausea Only  . Irbesartan Hives    Avapro  . Adhesive [Tape] Itching and Rash    To paper tape only. Rash and itching only where applied.    Past Medical History: Past Medical History  Diagnosis Date  . Other emphysema ([place])   . Unspecified essential hypertension   . Osteoporosis, unspecified   . Other and unspecified hyperlipidemia   . Esophageal reflux   . Unspecified asthma(493.90)   . Cancer ([place])   . Arthritis   . Emphysema of lung ([place])   . PONV (postoperative nausea and vomiting)   . On home oxygen  therapy     at night  . Full dentures   . Wears glasses   . History of radiation therapy 07/01/06-08/13/06    left breast  total dose 6100 cGy  . Breast cancer ([place]) 04/11/06 bx    left breast invasive cductal ca  . 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 Anxiety   . Depression   . Emphysema   . Cataract     extraction  . Allergy   . Hx of radiation therapy 08/31/12-09/28/12    right breast     Past Surgical History  Procedure Laterality Date  . Partial hysterectomy    . Cataract extraction    . Hip surgery   1993    left  . Breast surgery    . Cyst on face  6 months ago  . Mass excision  02/18/2012    Procedure: MINOR EXCISION OF MASS;  Surgeon: Erika Hector, MD;  Location: [place];  Service: General;  Laterality: Left;  excise 2.5 cm. mass left chest wall  . Mastectomy  05/14/06    lt lumpectomy-node dissDr.Leone  . Breast lumpectomy  07/13/12    right -DCIS 7 o'clock stage Tis,Nx  . Abdominal hysterectomy      partial    Social History: Patient lives alone in [place]. Quit smoking 20 years ago. No alcohol use. Uses a cane to ambulate. Usually independent otherwise. Drives.   Family History:  Family History  Problem Relation Age of Onset  . Coronary artery disease Mother   . Diabetes Father   . Cancer Father     prostate cancer  . Breast cancer Sister   . Cancer Sister   . Kidney cancer Brother   . COPD       Review of Systems - History obtained from the patient General ROS: positive for  - fatigue Psychological ROS: negative Ophthalmic ROS: negative ENT ROS: negative Allergy and Immunology ROS: negative Hematological and Lymphatic ROS: negative Endocrine ROS: negative Respiratory ROS: as in hpi Cardiovascular ROS: no chest pain or dyspnea on exertion Gastrointestinal ROS: no abdominal pain, change in bowel habits, or black or bloody stools Genito-Urinary ROS: no dysuria, trouble voiding, or hematuria Musculoskeletal ROS: negative Neurological ROS: no TIA or stroke symptoms Dermatological ROS: negative  Physical Examination  Filed Vitals:   11/07/15 1255 11/07/15 1306 11/07/15 1549 11/07/15 1613  BP: 96/45  88/70   Pulse:  84 86   Temp: 98.8 F (37.1 C)   101.5 F (38.6 C)  TempSrc: Oral   Rectal  Resp: 16  31   SpO2: 91%  93%     BP 88/70 mmHg  Pulse 86  Temp(Src) 101.5 F (38.6 C) (Rectal)  Resp 31  SpO2 93%  General appearance: alert, cooperative, appears stated age and no distress Head: Normocephalic, without obvious  abnormality, atraumatic Eyes: conjunctivae/corneas clear. PERRL, EOM's intact.  Throat: lips, mucosa, and tongue normal; teeth and gums normal Neck: no adenopathy, no carotid bruit, no JVD, supple, symmetrical, trachea midline and thyroid not enlarged, symmetric, no tenderness/mass/nodules Resp: Coarse breath sounds bilaterally. Crackles noted at the bases. end expiratory wheezes bilaterally. Cardio: regular rate and rhythm, S1, S2 normal, no murmur, click, rub or gallop GI: soft, non-tender; bowel sounds normal; no masses,  no organomegaly Extremities: extremities normal, atraumatic, no cyanosis or edema Pulses: 2+ and symmetric Skin: Skin color, texture, turgor normal. No rashes or lesions Lymph  nodes: Cervical, supraclavicular, and axillary nodes normal. Neurologic: Awake and alert. Oriented 3. No focal neurological deficits.  Laboratory Data: Results for orders placed or performed during the hospital encounter of 11/07/15 (from the past 48 hour(s))  Comprehensive metabolic panel     Status: Abnormal   Collection Time: 11/07/15  1:51 PM  Result Value Ref Range   Sodium 137 135 - 145 mmol/L   Potassium 3.5 3.5 - 5.1 mmol/L   Chloride 99 (L) 101 - 111 mmol/L   CO2 28 22 - 32 mmol/L   Glucose, Bld 127 (H) 65 - 99 mg/dL   BUN 35 (H) 6 - 20 mg/dL   Creatinine, Ser 1.08 (H) 0.44 - 1.00 mg/dL   Calcium 8.7 (L) 8.9 - 10.3 mg/dL   Total Protein 7.1 6.5 - 8.1 g/dL   Albumin 2.9 (L) 3.5 - 5.0 g/dL   AST 30 15 - 41 U/L   ALT 16 14 - 54 U/L   Alkaline Phosphatase 69 38 - 126 U/L   Total Bilirubin 0.5 0.3 - 1.2 mg/dL   GFR calc non Af Amer 46 (L) >60 mL/min   GFR calc Af Amer 53 (L) >60 mL/min    Comment: (NOTE) The eGFR has been calculated using the CKD EPI equation. This calculation has not been validated in all clinical situations. eGFR's persistently <60 mL/min signify possible Chronic Kidney Disease.    Anion gap 10 5 - 15  CBC with Differential     Status: Abnormal   Collection  Time: 11/07/15  1:51 PM  Result Value Ref Range   WBC 8.0 4.0 - 10.5 K/uL   RBC 3.36 (L) 3.87 - 5.11 MIL/uL   Hemoglobin 9.7 (L) 12.0 - 15.0 g/dL   HCT 29.5 (L) 36.0 - 46.0 %   MCV 87.8 78.0 - 100.0 fL   MCH 28.9 26.0 - 34.0 pg   MCHC 32.9 30.0 - 36.0 g/dL   RDW 14.3 11.5 - 15.5 %   Platelets 206 150 - 400 K/uL   Neutrophils Relative % 87 %   Neutro Abs 7.0 1.7 - 7.7 K/uL   Lymphocytes Relative 6 %   Lymphs Abs 0.5 (L) 0.7 - 4.0 K/uL   Monocytes Relative 7 %   Monocytes Absolute 0.6 0.1 - 1.0 K/uL   Eosinophils Relative 0 %   Eosinophils Absolute 0.0 0.0 - 0.7 K/uL   Basophils Relative 0 %   Basophils Absolute 0.0 0.0 - 0.1 K/uL  I-Stat CG4 Lactic Acid, ED (Not at [facility])     Status: None   Collection Time: 11/07/15  2:10 PM  Result Value Ref Range   Lactic Acid, Venous 1.36 0.5 - 2.0 mmol/L  I-Stat CG4 Lactic Acid, ED (Not at [facility])     Status: None   Collection Time: 11/07/15  4:03 PM  Result Value Ref Range   Lactic Acid, Venous 0.99 0.5 - 2.0 mmol/L    Radiology Reports: Dg Chest 2 View  11/07/2015  CLINICAL DATA:  Shortness breath, productive cough 4 days EXAM: CHEST  2 VIEW COMPARISON:  04/27/2015 FINDINGS: The lungs are hyperinflated likely secondary to COPD. There is chronic elevation of the right diaphragm. There is a small left pleural effusion. There is hazy left lower lobe airspace disease which may reflect atelectasis versus pneumonia. There is no right pleural effusion. The heart and mediastinal contours are unremarkable. There is a large hiatal hernia. The osseous structures are unremarkable. IMPRESSION: There is mild hazy left lower lobe airspace disease  concerning for pneumonia versus atelectasis. Electronically Signed   By: Kathreen Devoid   On: 11/07/2015 14:17    My interpretation of Electrocardiogram: Sinus rhythm in the 80s. Normal axis. Left bundle branch block. Nonspecific T wave changes. Similar to previous EKG.  Problem List  Principal Problem:   CAP  (community acquired pneumonia) Active Problems:   Essential hypertension   COPD (chronic obstructive pulmonary disease) with emphysema (HCC)   BREAST CANCER, HX OF   COPD exacerbation (HCC)   Acute respiratory failure with hypoxia [facility])   Assessment: 82 year old Caucasian female with a past medical history of COPD on home oxygen, and hypertension presents with 3 day history of worsening cough, shortness of breath. Chest x-ray raises concern for pneumonia. She also appears to have mild COPD exacerbation.  Plan: #1 Community-acquired pneumonia: She'll be admitted to the hospital. She'll be placed on Levaquin. Follow-up on urine for strep and legionella antigens. Sputum cultures.  #2 COPD with mild exacerbation: She'll be placed on nebulizer treatments, steroids. Antibiotics as mentioned above. Continue with her inhaled steroids.  #3 history of essential hypertension: Blood pressure noted to be low here. Lactic acid level is normal. Give IV fluids. Hold her antihypertensive agents for now. Patient appears to be asymptomatic. Blood pressure is improved when I checked it. Systolic was 183.  #4 Normocytic anemia: Hemoglobin slightly lower than her usual baseline of about 12. Patient denies any overt bleeding. Continue to monitor for now. FOBT. Anemia panel.  DVT Prophylaxis: Lovenox Code Status: Limited code. No intubation. Everything else is okay. Family Communication: Discussed with the patient and her sister  Disposition Plan: management as above  Further management decisions will depend on results of further testing and patient's response to treatment.   [facility]  [facility] Pager [phone]  If 7PM-7AM, please contact night-coverage [email or web address] Password [facility]  11/07/2015, 4:25 PM

## 2015-11-07 NOTE — Progress Notes (Signed)
Chart and office note reviewed in detail  > agree with a/p as outlined / very congested cough would benefit from flutter valve and move away from MDI's and ACEi at least in the short term.

## 2015-11-07 NOTE — Telephone Encounter (Signed)
Called spoke with pt. She c/o body aches all over, prod cough (white phlem), chest tightness, increase SOB, chills (? Fever). No vomiting. appt scheduled to see TP this AM at 11. Nothing further needed

## 2015-11-07 NOTE — Progress Notes (Signed)
Subjective:    Patient ID: [name], female    DOB: 1929/11/30, 82 y.o.   MRN: [id number]  HPI  82 year old female former smoker with GOLD II COPD and chronic hypoxic respiratory failure on 2 L of oxygen with act and At bedtime    TEST  PFT's 1998:  FEV1 1.40 (55%), ratio 50, +airtrapping, DLCO 53%   11/07/2015 Acute OV  Patient presents for an acute office visit. Patient complains over the last 4 days, that she's had increased cough, congestion with thick mucus, wheezing, shortness of breath, body aches and decreased appetite. On arrival to the office. O2 saturations were 80% on 2 L pulsing. Patient was placed on continuous flow oxygen at 3 L with O2 saturations at 94%. Patient says that she's been very weak and short of breath. Patient is accompanied by her friend. Patient had to be brought in on wheelchair.  She says she had a flu shot. She says she does not know if she's been running a fever.Marland Kitchen He denies any nausea, vomiting or diarrhea. She is maintained on Advair and Spiriva. She is also on an ACE inhibitor. Says she was doing well prior to 4 days ago. She feels that she is too weak to go home.. She denies any chest pain, hemoptysis, orthopnea, PND, or increased leg swelling..   Past Medical History  Diagnosis Date  . Other emphysema ([place])   . Unspecified essential hypertension   . Osteoporosis, unspecified   . Other and unspecified hyperlipidemia   . Esophageal reflux   . Unspecified asthma(493.90)   . Cancer ([place])   . Arthritis   . Emphysema of lung ([place])   . PONV (postoperative nausea and vomiting)   . On home oxygen therapy     at night  . Full dentures   . Wears glasses   . History of radiation therapy 07/01/06-08/13/06    left breast  total dose 6100 cGy  . Breast cancer ([place]) 04/11/06 bx    left breast invasive cductal ca  . 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 Anxiety   .  Depression   . Emphysema   . Cataract     extraction  . Allergy   . Hx of radiation therapy 08/31/12-09/28/12    right breast    Current Outpatient Prescriptions on File Prior to Visit  Medication Sig Dispense Refill  . albuterol (PROVENTIL HFA) 108 (90 BASE) MCG/ACT inhaler Inhale 2 puffs into the lungs every 6 (six) hours as needed. 1 Inhaler 0  . ALPRAZolam (XANAX) 0.5 MG tablet Take 0.5 mg by mouth as needed.     Marland Kitchen amLODipine (NORVASC) 5 MG tablet TAKE 1 TABLET (5 MG TOTAL) BY MOUTH DAILY. 90 tablet 2  . aspirin 81 MG tablet Take 81 mg by mouth daily.      Marland Kitchen atorvastatin (LIPITOR) 10 MG tablet TAKE 1 TABLET (10 MG TOTAL) BY MOUTH DAILY. 90 tablet 1  . Cholecalciferol (VITAMIN D) 2000 UNITS tablet Take 2,000 Units by mouth daily.    . Fluticasone-Salmeterol (ADVAIR DISKUS) 250-50 MCG/DOSE AEPB INHALE 1 PUFF INTO THE LUNGS EVERY 12 HOURS 180 each 3  . HYDROcodone-homatropine (HYCODAN) 5-1.5 MG/5ML syrup Take 5 mLs by mouth every 6 (six) hours as needed for cough. 120 mL 0  . omeprazole (PRILOSEC) 20 MG capsule Take 1 capsule (20 mg total) by mouth daily. 180 capsule 1  . ramipril (ALTACE) 10 MG  capsule TAKE 1 CAPSULE (10 MG TOTAL) BY MOUTH DAILY. 90 capsule 1  . saccharomyces boulardii (FLORASTOR) 250 MG capsule Take 250 mg by mouth every morning.    . tiotropium (SPIRIVA HANDIHALER) 18 MCG inhalation capsule PLACE 1 CAPSULE (18 MCG TOTAL) INTO INHALER AND INHALE DAILY. 90 capsule 3  . albuterol (PROVENTIL) (2.5 MG/3ML) 0.083% nebulizer solution Take 2.5 mg by nebulization every 6 (six) hours as needed. Reported on 11/07/2015     No current facility-administered medications on file prior to visit.      Review of Systems Constitutional:   No  weight loss, night sweats,  Fevers, chills,  +fatigue, or  lassitude.  HEENT:   No headaches,  Difficulty swallowing,  Tooth/dental problems, or  Sore throat,                No sneezing, itching, ear ache, + nasal congestion, post nasal drip,    CV:  No chest pain,  Orthopnea, PND, swelling in lower extremities, anasarca, dizziness, palpitations, syncope.   GI  No heartburn, indigestion, abdominal pain, nausea, vomiting, diarrhea, change in bowel habits, loss of appetite, bloody stools.   Resp:   No chest wall deformity  Skin: no rash or lesions.  GU: no dysuria, change in color of urine, no urgency or frequency.  No flank pain, no hematuria   MS:  No joint pain or swelling.  No decreased range of motion.  No back pain.  Psych:  No change in mood or affect. No depression or anxiety.  No memory loss.          Objective:   Physical Exam Filed Vitals:   11/07/15 1051  Pulse: 96  Temp: 98.4 F (36.9 C)  TempSrc: Oral  Height: 5\' 5"  (1.651 m)  Weight: 126 lb (57.153 kg)  SpO2: 92%   GEN: A/Ox3; pleasant , NAD, frail and elderly in a wheelchair on oxygen  HEENT:  [place]/AT,  EACs-clear, TMs-wnl, NOSE-clear, THROAT-clear, no lesions, no postnasal drip or exudate noted.   NECK:  Supple w/ fair ROM; no JVD; normal carotid impulses w/o bruits; no thyromegaly or nodules palpated; no lymphadenopathy.  RESP  coarse rhonchi bilaterally no accessory muscle use, no dullness to percussion  CARD:  RRR, no m/r/g  , trace peripheral edema, pulses intact, no cyanosis or clubbing.  GI:   Soft & nt; nml bowel sounds; no organomegaly or masses detected.  Musco: Warm bil, no deformities or joint swelling noted.   Neuro: alert, no focal deficits noted.    Skin: Warm, no lesions or rashes         Assessment & Plan:

## 2015-11-07 NOTE — Progress Notes (Signed)
EDCM spoke to patient at bedside.  Patient reports she lives home alone.  Patient has never had home health services before.  Patient confirms her pcp is Dr. Cathlean Cower.  Patient reports she wears oxygen 2 liters at night and will wear during the day if she is doing something strenuous.  Patient reports she is able to perform her own ADL's at home and is not home bound.  EDCM provided patient with list of home health agencies in [facility], explained services if patient needs in future.  Patient thankful for resources.  No further EDCM needs at this time.

## 2015-11-07 NOTE — ED Provider Notes (Signed)
CSN: [id number]     Arrival date & time 11/07/15  1201 History   First MD Initiated Contact with Patient 11/07/15 1426     Chief Complaint  Patient presents with  . Cough  . Weakness     (Consider location/radiation/quality/duration/timing/severity/associated sxs/prior Treatment) The history is provided by the patient.     Pt with hx COPD sent from pulmonology office for admission for Acute on Chronic Hypoxic failure, Acute COPD exacerbation.  Pt has had 3 days of chills, SOB, cough productive of yellow sputum.  SOB with even sitting up on the side of the bed.  Usually wears 2L O2 [place] at home, has required more for comfort.  O2 on home oxygen was in the 80s at home.   Denies CP, leg swelling.    Past Medical History  Diagnosis Date  . Other emphysema ([place])   . Unspecified essential hypertension   . Osteoporosis, unspecified   . Other and unspecified hyperlipidemia   . Esophageal reflux   . Unspecified asthma(493.90)   . Cancer ([place])   . Arthritis   . Emphysema of lung ([place])   . PONV (postoperative nausea and vomiting)   . On home oxygen therapy     at night  . Full dentures   . Wears glasses   . History of radiation therapy 07/01/06-08/13/06    left breast  total dose 6100 cGy  . Breast cancer ([place]) 04/11/06 bx    left breast invasive cductal ca  . 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 Anxiety   . Depression   . Emphysema   . Cataract     extraction  . Allergy   . Hx of radiation therapy 08/31/12-09/28/12    right breast    Past Surgical History  Procedure Laterality Date  . Partial hysterectomy    . Cataract extraction    . Hip surgery  1993    left  . Breast surgery    . Cyst on face  6 months ago  . Mass excision  02/18/2012    Procedure: MINOR EXCISION OF MASS;  Surgeon: Adin Hector, MD;  Location: [facility];  Service: General;  Laterality: Left;  excise 2.5 cm.  mass left chest wall  . Mastectomy  05/14/06    lt lumpectomy-node dissDr.Leone  . Breast lumpectomy  07/13/12    right -DCIS 7 o'clock stage Tis,Nx  . Abdominal hysterectomy      partial   Family History  Problem Relation Age of Onset  . Coronary artery disease Mother   . Diabetes Father   . Cancer Father     prostate cancer  . Breast cancer Sister   . Cancer Sister   . Kidney cancer Brother   . COPD     Social History  Substance Use Topics  . Smoking status: Former Smoker -- 1.50 packs/day for 43 years    Types: Cigarettes    Start date: 11/23/1953    Quit date: 09/24/1995  . Smokeless tobacco: Never Used     Comment: quit 16 years ago  . Alcohol Use: No   OB History    No data available     Review of Systems  All other systems reviewed and are negative.     Allergies  Neosporin; Codeine; Irbesartan; and Adhesive  Home Medications   Prior to Admission medications   Medication Sig Start Date End Date Taking? Authorizing  Provider  albuterol (PROVENTIL HFA) 108 (90 BASE) MCG/ACT inhaler Inhale 2 puffs into the lungs every 6 (six) hours as needed. Patient taking differently: Inhale 2 puffs into the lungs every 6 (six) hours as needed for wheezing or shortness of breath.  09/03/12  Yes Kathee Delton, MD  ALPRAZolam Duanne Moron) 0.5 MG tablet Take 0.5 mg by mouth as needed for anxiety.  03/31/12  Yes Historical Provider, MD  amLODipine (NORVASC) 5 MG tablet TAKE 1 TABLET (5 MG TOTAL) BY MOUTH DAILY. 10/27/15  Yes Biagio Borg, MD  aspirin 81 MG tablet Take 81 mg by mouth daily.     Yes Historical Provider, MD  atorvastatin (LIPITOR) 10 MG tablet TAKE 1 TABLET (10 MG TOTAL) BY MOUTH DAILY. 10/27/15  Yes Biagio Borg, MD  Cholecalciferol (VITAMIN D) 2000 UNITS tablet Take 2,000 Units by mouth daily.   Yes Historical Provider, MD  Fluticasone-Salmeterol (ADVAIR DISKUS) 250-50 MCG/DOSE AEPB INHALE 1 PUFF INTO THE LUNGS EVERY 12 HOURS 06/15/15  Yes Collene Gobble, MD   HYDROcodone-homatropine [facility]) 5-1.5 MG/5ML syrup Take 5 mLs by mouth every 6 (six) hours as needed for cough. 05/25/14  Yes Kathee Delton, MD  omeprazole (PRILOSEC) 20 MG capsule Take 1 capsule (20 mg total) by mouth daily. 08/25/15  Yes Biagio Borg, MD  ramipril (ALTACE) 10 MG capsule TAKE 1 CAPSULE (10 MG TOTAL) BY MOUTH DAILY. 10/27/15  Yes Biagio Borg, MD  saccharomyces boulardii (FLORASTOR) 250 MG capsule Take 250 mg by mouth every morning.   Yes Historical Provider, MD  tiotropium (SPIRIVA HANDIHALER) 18 MCG inhalation capsule PLACE 1 CAPSULE (18 MCG TOTAL) INTO INHALER AND INHALE DAILY. 10/05/15  Yes Collene Gobble, MD   BP 96/45 mmHg  Pulse 84  Temp(Src) 98.8 F (37.1 C) (Oral)  Resp 16  SpO2 91% Physical Exam  Constitutional: She appears well-developed and well-nourished. No distress.  HENT:  Head: Normocephalic and atraumatic.  Neck: Neck supple.  Cardiovascular: Normal rate and regular rhythm.   Pulmonary/Chest: She is in respiratory distress. She has wheezes. She has no rales.  Abdominal: Soft. She exhibits no distension. There is no tenderness. There is no rebound and no guarding.  Musculoskeletal: She exhibits no edema.  Neurological: She is alert. She exhibits normal muscle tone.  Skin: She is not diaphoretic.  Nursing note and vitals reviewed.   ED Course  Procedures (including critical care time) Labs Review Labs Reviewed  COMPREHENSIVE METABOLIC PANEL - Abnormal; Notable for the following:    Chloride 99 (*)    Glucose, Bld 127 (*)    BUN 35 (*)    Creatinine, Ser 1.08 (*)    Calcium 8.7 (*)    Albumin 2.9 (*)    GFR calc non Af Amer 46 (*)    GFR calc Af Amer 53 (*)    All other components within normal limits  CBC WITH DIFFERENTIAL/PLATELET - Abnormal; Notable for the following:    RBC 3.36 (*)    Hemoglobin 9.7 (*)    HCT 29.5 (*)    Lymphs Abs 0.5 (*)    All other components within normal limits  CULTURE, BLOOD (ROUTINE X 2)  CULTURE, BLOOD  (ROUTINE X 2)  URINE CULTURE  URINALYSIS, ROUTINE W REFLEX MICROSCOPIC (NOT AT [facility])  I-STAT CG4 LACTIC ACID, ED    Imaging Review Dg Chest 2 View  11/07/2015  CLINICAL DATA:  Shortness breath, productive cough 4 days EXAM: CHEST  2 VIEW COMPARISON:  04/27/2015  FINDINGS: The lungs are hyperinflated likely secondary to COPD. There is chronic elevation of the right diaphragm. There is a small left pleural effusion. There is hazy left lower lobe airspace disease which may reflect atelectasis versus pneumonia. There is no right pleural effusion. The heart and mediastinal contours are unremarkable. There is a large hiatal hernia. The osseous structures are unremarkable. IMPRESSION: There is mild hazy left lower lobe airspace disease concerning for pneumonia versus atelectasis. Electronically Signed   By: Kathreen Devoid   On: 11/07/2015 14:17   I have personally reviewed and evaluated these images and lab results as part of my medical decision-making.   EKG Interpretation None      3:10 PM  Discussed pt with Dr Maryland Pink who accepts patient for admission.    MDM   Final diagnoses:  Acute on chronic respiratory failure with hypoxia (HCC)  COPD with acute exacerbation (HCC)  CAP (community acquired pneumonia)   Pt with COPD with 4 days worsening SOB, increased sputum, chills.  Wheezing on exam, decreased air movement.  CXR concerning for pneumonia.  No recent hospitalizations or antibiotics.  Sent from pulmonologist office for admission.  Hypoxic in ED.  WBC normal and lactic acid normal.  Doubt sepsis.  Dr Kathrynn Humble made aware of the patient.  IV levaquin, solu medrol, duoneb ordered. Admitted to [facility].    Clayton Bibles, PA-C 11/07/15 [place], MD 11/08/15 [phone]

## 2015-11-07 NOTE — ED Notes (Signed)
Hx of COPD, Saturday began coughing more than normal, cough becoming productive yellow sputum, feeling very weak, and has lost her appetite. No pain. No obvious distress in triage, on 2L O2 at home, O2 sat in 80s at PCP office today, saturation improved by increasing home O2 to 3L. Denies chest pain, SOB, N/V/D

## 2015-11-07 NOTE — ED Notes (Signed)
Pt states that the steroid makes her anxious. It was explained to her that the steroid has already gone in to her IV but is very important to help her breathing. Pt will be given PRN xanax for anxiety.

## 2015-11-07 NOTE — Addendum Note (Signed)
Addended by: Melvenia Needles on: 11/07/2015 05:35 PM   Modules accepted: Level of Service

## 2015-11-08 DIAGNOSIS — J189 Pneumonia, unspecified organism: Secondary | ICD-10-CM

## 2015-11-08 LAB — COMPREHENSIVE METABOLIC PANEL
ALBUMIN: 2.5 g/dL — AB (ref 3.5–5.0)
ALK PHOS: 66 U/L (ref 38–126)
ALT: 15 U/L (ref 14–54)
ANION GAP: 8 (ref 5–15)
AST: 27 U/L (ref 15–41)
BUN: 20 mg/dL (ref 6–20)
CALCIUM: 8.7 mg/dL — AB (ref 8.9–10.3)
CHLORIDE: 107 mmol/L (ref 101–111)
CO2: 27 mmol/L (ref 22–32)
Creatinine, Ser: 0.67 mg/dL (ref 0.44–1.00)
GFR calc Af Amer: >60 mL/min (ref >60)
GFR calc non Af Amer: >60 mL/min (ref >60)
GLUCOSE: 154 mg/dL — AB (ref 65–99)
Potassium: 3.8 mmol/L (ref 3.5–5.1)
SODIUM: 142 mmol/L (ref 135–145)
Total Bilirubin: 0.2 mg/dL — ABNORMAL LOW (ref 0.3–1.2)
Total Protein: 6.5 g/dL (ref 6.5–8.1)

## 2015-11-08 LAB — IRON AND TIBC
Iron: 12 ug/dL — ABNORMAL LOW (ref 28–170)
SATURATION RATIOS: 5 % — AB (ref 10.4–31.8)
TIBC: 220 ug/dL — AB (ref 250–450)
UIBC: 208 ug/dL

## 2015-11-08 LAB — VITAMIN B12: VITAMIN B 12: 610 pg/mL (ref 180–914)

## 2015-11-08 LAB — CBC
HCT: 33 % — ABNORMAL LOW (ref 36.0–46.0)
HEMOGLOBIN: 10.3 g/dL — AB (ref 12.0–15.0)
MCH: 28.3 pg (ref 26.0–34.0)
MCHC: 31.2 g/dL (ref 30.0–36.0)
MCV: 90.7 fL (ref 78.0–100.0)
Platelets: 225 10*3/uL (ref 150–400)
RBC: 3.64 MIL/uL — AB (ref 3.87–5.11)
RDW: 14.6 % (ref 11.5–15.5)
WBC: 5.1 10*3/uL (ref 4.0–10.5)

## 2015-11-08 LAB — RETICULOCYTES
RBC.: 3.64 MIL/uL — AB (ref 3.87–5.11)
RETIC CT PCT: 0.6 % (ref 0.4–3.1)
Retic Count, Absolute: 21.8 10*3/uL (ref 19.0–186.0)

## 2015-11-08 LAB — FERRITIN: FERRITIN: 116 ng/mL (ref 11–307)

## 2015-11-08 LAB — HIV ANTIBODY (ROUTINE TESTING W REFLEX): HIV Screen 4th Generation wRfx: NONREACTIVE

## 2015-11-08 LAB — STREP PNEUMONIAE URINARY ANTIGEN: Strep Pneumo Urinary Antigen: POSITIVE — AB

## 2015-11-08 LAB — FOLATE: Folate: 14.4 ng/mL (ref >5.9)

## 2015-11-08 MED ORDER — SODIUM CHLORIDE 0.9 % IV SOLN
Freq: Three times a day (TID) | INTRAVENOUS | Status: DC
  Administered 2015-11-08: 14:00:00 via INTRAVENOUS
  Filled 2015-11-08 (×2): qty 50

## 2015-11-08 MED ORDER — SODIUM CHLORIDE 0.9 % IV SOLN
3.0000 g | Freq: Four times a day (QID) | INTRAVENOUS | Status: DC
  Administered 2015-11-08 – 2015-11-10 (×8): 3 g via INTRAVENOUS
  Filled 2015-11-08 (×10): qty 3

## 2015-11-08 MED ORDER — ALBUTEROL SULFATE (2.5 MG/3ML) 0.083% IN NEBU
2.5000 mg | INHALATION_SOLUTION | Freq: Three times a day (TID) | RESPIRATORY_TRACT | Status: DC
  Administered 2015-11-08 – 2015-11-10 (×7): 2.5 mg via RESPIRATORY_TRACT
  Filled 2015-11-08 (×8): qty 3

## 2015-11-08 MED ORDER — LEVOFLOXACIN IN D5W 750 MG/150ML IV SOLN: 750.0000 mg | INTRAVENOUS | Status: DC

## 2015-11-08 MED ORDER — SODIUM CHLORIDE 0.9 % IV SOLN
60.0000 mg | Freq: Two times a day (BID) | INTRAVENOUS | Status: DC
  Administered 2015-11-09: 60 mg via INTRAVENOUS
  Filled 2015-11-08 (×3): qty 50

## 2015-11-08 NOTE — Evaluation (Signed)
Physical Therapy Evaluation Patient Details Name: [name] MRN: [id number] DOB: 1930-07-02 Today's Date: 11/08/2015   History of Present Illness  [name] is a 82 y.o. female with a past medical history of COPD on home oxygen at 2 L/m, hypertension, admitted 11/07/15 with worsening shortness of breath and hypoxia. Significant for pneumonia.  Clinical Impression  Pleasant  Female who ambulated on 4 liters of oxygen x 90'. Patient will benefit from PT to address problems listed in the note below to Dc to next venue .Patient  Lives alone, does not have 24/7 caregivers.    Follow Up Recommendations SNF;Supervision/Assistance - 24 hour    Equipment Recommendations  None recommended by PT    Recommendations for Other Services       Precautions / Restrictions Precautions Precautions: Fall Precaution Comments: on oxygen, monitor sats      Mobility  Bed Mobility Overal bed mobility: Needs Assistance Bed Mobility: Supine to Sit     Supine to sit: Supervision     General bed mobility comments: extra time  Transfers Overall transfer level: Needs assistance Equipment used: Rolling walker (2 wheeled) Transfers: Sit to/from Stand Sit to Stand: Min assist            Ambulation/Gait Ambulation/Gait assistance: Min assist;+2 safety/equipment Ambulation Distance (Feet): 75 Feet Assistive device: Rolling walker (2 wheeled) Gait Pattern/deviations: Step-through pattern     General Gait Details: cues for pursed lip breathing.Sats >94 % on 4 liters [place] through out, dyspnea2-3/4  Stairs            Wheelchair Mobility    Modified Rankin (Stroke Patients Only)       Balance                                             Pertinent Vitals/Pain Pain Assessment: No/denies pain    Home Living Family/patient expects to be discharged to:: Skilled nursing facility Living Arrangements: Alone Available Help at Discharge: Family;Available  PRN/intermittently Type of Home: House Home Access: Stairs to enter   [employer] of Steps: 2 Home Layout: One level Home Equipment: Walker - 4 wheels      Prior Function Level of Independence: Independent         Comments: drives and independent     Hand Dominance        Extremity/Trunk Assessment   Upper Extremity Assessment: Generalized weakness           Lower Extremity Assessment: Generalized weakness      Cervical / Trunk Assessment: Kyphotic  Communication   Communication: No difficulties  Cognition Arousal/Alertness: Awake/alert Behavior During Therapy: WFL for tasks assessed/performed Overall Cognitive Status: Within Functional Limits for tasks assessed                      General Comments      Exercises        Assessment/Plan    PT Assessment Patient needs continued PT services  PT Diagnosis Difficulty walking;Generalized weakness   PT Problem List Decreased strength;Decreased activity tolerance;Decreased mobility;Cardiopulmonary status limiting activity;Decreased knowledge of precautions;Decreased safety awareness;Decreased knowledge of use of DME  PT Treatment Interventions DME instruction;Gait training;Functional mobility training;Therapeutic activities;Therapeutic exercise;Patient/family education   PT Goals (Current goals can be found in the Care Plan section) Acute Rehab PT Goals Patient Stated Goal: to go to rehab, get back to  my home PT Goal Formulation: With patient/family Time For Goal Achievement: 11/22/15 Potential to Achieve Goals: Good    Frequency Min 3X/week   Barriers to discharge Decreased caregiver support      Co-evaluation               End of Session Equipment Utilized During Treatment: Oxygen Activity Tolerance: Patient tolerated treatment well Patient left: in chair;with call bell/phone within reach;with chair alarm set;with family/visitor present Nurse Communication: Mobility  status         Time: 1535-1550 PT Time Calculation (min) (ACUTE ONLY): 15 min   Charges:   PT Evaluation $PT Eval Low Complexity: 1 Procedure     PT G CodesClaretha Cooper 11/08/2015, 4:50 PM Tresa Endo PT [phone]

## 2015-11-08 NOTE — Progress Notes (Signed)
Pharmacy Antibiotic Note  [name] is a 82 y.o. female admitted on 11/07/2015 with CAP and initially started on Levaquin.  Pharmacy has now been consulted for Unasyn dosing.  Plan:  Unasyn 3g IV q6h  Follow up renal fxn, culture results, and clinical course.   Height: 5' 5.5" (166.4 cm) Weight: 123 lb 14.4 oz (56.2 kg) IBW/kg (Calculated) : 58.15  Temp (24hrs), Avg:98.9 F (37.2 C), Min:97.5 F (36.4 C), Max:101.5 F (38.6 C)   Recent Labs Lab 11/07/15 1351 11/07/15 1410 11/07/15 1603 11/08/15 0648  WBC 8.0  --   --  5.1  CREATININE 1.08*  --   --  0.67  LATICACIDVEN  --  1.36 0.99  --     Estimated Creatinine Clearance: 47.3 mL/min (by C-G formula based on Cr of 0.67).    Allergies  Allergen Reactions  . Neosporin [Neomycin-Polymyxin B Gu] Itching and Rash  . Codeine Nausea Only  . Irbesartan Hives    Avapro  . Adhesive [Tape] Itching and Rash    To paper tape only. Rash and itching only where applied.    Antimicrobials this admission: 2/14 >> Levaquin >> 2/15 2/15 >> Unasyn >>  Dose adjustments this admission:  Microbiology results: 2/14 BCx: ngtd 2/14 UCx: sent  2/14 Strep pneumo Ur Ag: positive 2/14 Legionella Ur Ag: pending  Thank you for allowing pharmacy to be a part of this patient's care.  Gretta Arab PharmD, BCPS Pager [phone] 11/08/2015 2:47 PM

## 2015-11-08 NOTE — Progress Notes (Signed)
Patient Demographics  [name], is a 82 y.o. female, DOB - Apr 13, 1930, [id number]  Admit date - 11/07/2015   Admitting Physician Bonnielee Haff, MD  Outpatient Primary MD for the patient is Cathlean Cower, MD  LOS - 1   Chief Complaint  Patient presents with  . Cough  . Weakness       Admission HPI/Brief narrative: 82 year old female with past medical history of COPD on 2 L nasal cannula on exertion, hypertension, presents with cough and shortness of breath, significant for pneumonia, and COPD exacerbation, patient has urine antigen positive for strep pneumonia.  Subjective:   Kaneshia Fornshell today has, No headache, No chest pain, No abdominal pain - No Nausea, complaints of cough, clear color today, was yellow yesterday, reports dyspnea is improving, but much worse than her baseline . Assessment & Plan    Principal Problem:   CAP (community acquired pneumonia) Active Problems:   Essential hypertension   COPD (chronic obstructive pulmonary disease) with emphysema (HCC)   BREAST CANCER, HX OF   COPD exacerbation ([place])   Acute respiratory failure with hypoxia (HCC)  Community-acquired pneumonia - Patient with left lower lobe opacity on chest x-ray, treated initially with levofloxacin, blood cultures remains with no growth today, patient has strep pneumonia antigen positive, will transition to Unasyn.  COPD with mild exacerbation - Continue with nebulizer treatment, will taper down Solu-Medrol, will continue with her inhaled steroid, continue with antibiotics  Hypertension - Blood pressure remained soft, continue to hold home medication.  Normocytic anemia Hemoglobin slightly lower than her usual baseline of about 12. Patient denies any overt bleeding. Continue to monitor for now. FOBT. Anemia panel  Code Status: Partial  Family Communication:sister at bedside  Disposition Plan:  pending PT consult   Procedures none   Consults   none   Medications  Scheduled Meds: . albuterol  2.5 mg Nebulization TID  . aspirin  81 mg Oral Daily  . atorvastatin  10 mg Oral q1800  . docusate sodium  100 mg Oral BID  . enoxaparin (LOVENOX) injection  40 mg Subcutaneous QHS  . mometasone-formoterol  2 puff Inhalation BID  . pantoprazole  40 mg Oral Daily  . saccharomyces boulardii  250 mg Oral BID  . [START ON 11/09/2015] sodium chloride 0.9 % 50 mL with methylPREDNISolone sodium succinate (SOLU-MEDROL) 125 mg/2 mL 60 mg infusion   Intravenous Q12H  . sodium chloride flush  3 mL Intravenous Q12H  . tiotropium  18 mcg Inhalation Daily   Continuous Infusions:  PRN Meds:.sodium chloride, acetaminophen **OR** acetaminophen, albuterol, ALPRAZolam, ondansetron **OR** ondansetron (ZOFRAN) IV, sodium chloride flush  DVT Prophylaxis  Lovenox   Lab Results  Component Value Date   PLT 225 11/08/2015    Antibiotics    Anti-infectives    Start     Dose/Rate Route Frequency Ordered Stop   11/09/15 1600  levofloxacin (LEVAQUIN) IVPB 750 mg  Status:  Discontinued     750 mg 100 mL/hr over 90 Minutes Intravenous Every 48 hours 11/08/15 0033 11/08/15 1442   11/08/15 1000  levofloxacin (LEVAQUIN) IVPB 750 mg  Status:  Discontinued     750 mg 100 mL/hr over 90 Minutes Intravenous Every 24 hours 11/07/15 2340 11/08/15 0031   11/07/15  1445  levofloxacin (LEVAQUIN) IVPB 750 mg     750 mg 100 mL/hr over 90 Minutes Intravenous  Once 11/07/15 1441 11/07/15 1805          Objective:   Filed Vitals:   11/08/15 0249 11/08/15 0501 11/08/15 0926 11/08/15 0932  BP:  112/56    Pulse:  82    Temp:  97.6 F (36.4 C)    TempSrc:  Oral    Resp:  19    Height:      Weight:      SpO2: 96% 96% 96% 95%    Wt Readings from Last 3 Encounters:  11/08/15 56.2 kg (123 lb 14.4 oz)  11/07/15 57.153 kg (126 lb)  08/31/15 60.782 kg (134 lb)     Intake/Output Summary (Last 24 hours) at  11/08/15 1445 Last data filed at 11/08/15 1400  Gross per 24 hour  Intake   5.67 ml  Output    150 ml  Net -144.33 ml     Physical Exam  Awake Alert, Oriented X 3, No new F.N deficits, Normal affect [place].AT,PERRAL Supple Neck,No JVD, No cervical lymphadenopathy appriciated.  Symmetrical Chest wall movement, Good air movement bilaterally, scattered wheezing RRR,No Gallops,Rubs or new Murmurs, No Parasternal Heave +ve B.Sounds, Abd Soft, No tenderness, No organomegaly appriciated, No rebound - guarding or rigidity. No Cyanosis, Clubbing or edema, No new Rash or bruise     Data Review   Micro Results Recent Results (from the past 240 hour(s))  Culture, blood (routine x 2)     Status: None (Preliminary result)   Collection Time: 11/07/15  1:52 PM  Result Value Ref Range Status   Specimen Description BLOOD RIGHT FOREARM  Final   Special Requests BOTTLES DRAWN AEROBIC AND ANAEROBIC 5CC  Final   Culture   Final    NO GROWTH < 24 HOURS Performed at [facility]    Report Status PENDING  Incomplete  Culture, blood (routine x 2)     Status: None (Preliminary result)   Collection Time: 11/07/15  1:56 PM  Result Value Ref Range Status   Specimen Description BLOOD RIGHT ANTECUBITAL  Final   Special Requests BOTTLES DRAWN AEROBIC AND ANAEROBIC 5CC  Final   Culture   Final    NO GROWTH < 24 HOURS Performed at [facility]    Report Status PENDING  Incomplete    Radiology Reports Dg Chest 2 View  11/07/2015  CLINICAL DATA:  Shortness breath, productive cough 4 days EXAM: CHEST  2 VIEW COMPARISON:  04/27/2015 FINDINGS: The lungs are hyperinflated likely secondary to COPD. There is chronic elevation of the right diaphragm. There is a small left pleural effusion. There is hazy left lower lobe airspace disease which may reflect atelectasis versus pneumonia. There is no right pleural effusion. The heart and mediastinal contours are unremarkable. There is a large hiatal hernia.  The osseous structures are unremarkable. IMPRESSION: There is mild hazy left lower lobe airspace disease concerning for pneumonia versus atelectasis. Electronically Signed   By: Kathreen Devoid   On: 11/07/2015 14:17     CBC  Recent Labs Lab 11/07/15 1351 11/08/15 0648  WBC 8.0 5.1  HGB 9.7* 10.3*  HCT 29.5* 33.0*  PLT 206 225  MCV 87.8 90.7  MCH 28.9 28.3  MCHC 32.9 31.2  RDW 14.3 14.6  LYMPHSABS 0.5*  --   MONOABS 0.6  --   EOSABS 0.0  --   BASOSABS 0.0  --  Chemistries   Recent Labs Lab 11/07/15 1351 11/08/15 0648  NA 137 142  K 3.5 3.8  CL 99* 107  CO2 28 27  GLUCOSE 127* 154*  BUN 35* 20  CREATININE 1.08* 0.67  CALCIUM 8.7* 8.7*  AST 30 27  ALT 16 15  ALKPHOS 69 66  BILITOT 0.5 0.2*   ------------------------------------------------------------------------------------------------------------------ estimated creatinine clearance is 47.3 mL/min (by C-G formula based on Cr of 0.67). ------------------------------------------------------------------------------------------------------------------ No results for input(s): HGBA1C in the last 72 hours. ------------------------------------------------------------------------------------------------------------------ No results for input(s): CHOL, HDL, LDLCALC, TRIG, CHOLHDL, LDLDIRECT in the last 72 hours. ------------------------------------------------------------------------------------------------------------------ No results for input(s): TSH, T4TOTAL, T3FREE, THYROIDAB in the last 72 hours.  Invalid input(s): FREET3 ------------------------------------------------------------------------------------------------------------------  Recent Labs  11/08/15 0648  VITAMINB12 610  FOLATE 14.4  FERRITIN 116  TIBC 220*  IRON 12*  RETICCTPCT 0.6    Coagulation profile No results for input(s): INR, PROTIME in the last 168 hours.  No results for input(s): DDIMER in the last 72 hours.  Cardiac Enzymes No  results for input(s): CKMB, TROPONINI, MYOGLOBIN in the last 168 hours.  Invalid input(s): CK ------------------------------------------------------------------------------------------------------------------ Invalid input(s): POCBNP     Time Spent in minutes   30 minutes   ELGERGAWY, DAWOOD M.D on 11/08/2015 at 2:45 PM  Between 7am to 7pm - Pager - [phone]  After 7pm go to [email or web address] - password [email or web address]  [facility]   Office  [phone]

## 2015-11-09 DIAGNOSIS — I10 Essential (primary) hypertension: Secondary | ICD-10-CM

## 2015-11-09 DIAGNOSIS — J9601 Acute respiratory failure with hypoxia: Secondary | ICD-10-CM

## 2015-11-09 DIAGNOSIS — J441 Chronic obstructive pulmonary disease with (acute) exacerbation: Secondary | ICD-10-CM

## 2015-11-09 LAB — EXPECTORATED SPUTUM ASSESSMENT W REFEX TO RESP CULTURE

## 2015-11-09 LAB — CBC
HEMATOCRIT: 34.2 % — AB (ref 36.0–46.0)
Hemoglobin: 10.5 g/dL — ABNORMAL LOW (ref 12.0–15.0)
MCH: 28.5 pg (ref 26.0–34.0)
MCHC: 30.7 g/dL (ref 30.0–36.0)
MCV: 92.9 fL (ref 78.0–100.0)
PLATELETS: 230 10*3/uL (ref 150–400)
RBC: 3.68 MIL/uL — ABNORMAL LOW (ref 3.87–5.11)
RDW: 14.6 % (ref 11.5–15.5)
WBC: 6.2 10*3/uL (ref 4.0–10.5)

## 2015-11-09 LAB — BASIC METABOLIC PANEL
ANION GAP: 6 (ref 5–15)
BUN: 16 mg/dL (ref 6–20)
CALCIUM: 8.6 mg/dL — AB (ref 8.9–10.3)
CO2: 32 mmol/L (ref 22–32)
CREATININE: 0.52 mg/dL (ref 0.44–1.00)
Chloride: 106 mmol/L (ref 101–111)
GLUCOSE: 154 mg/dL — AB (ref 65–99)
Potassium: 3.9 mmol/L (ref 3.5–5.1)
Sodium: 144 mmol/L (ref 135–145)

## 2015-11-09 LAB — URINE CULTURE

## 2015-11-09 LAB — LEGIONELLA ANTIGEN, URINE

## 2015-11-09 LAB — EXPECTORATED SPUTUM ASSESSMENT W GRAM STAIN, RFLX TO RESP C

## 2015-11-09 MED ORDER — GUAIFENESIN ER 600 MG PO TB12
600.0000 mg | ORAL_TABLET | Freq: Two times a day (BID) | ORAL | Status: DC
  Administered 2015-11-09 – 2015-11-10 (×2): 600 mg via ORAL
  Filled 2015-11-09 (×3): qty 1

## 2015-11-09 MED ORDER — PREDNISONE 20 MG PO TABS
40.0000 mg | ORAL_TABLET | Freq: Every day | ORAL | Status: DC
  Administered 2015-11-10: 40 mg via ORAL
  Filled 2015-11-09: qty 2

## 2015-11-09 MED ORDER — HYDROCOD POLST-CPM POLST ER 10-8 MG/5ML PO SUER
5.0000 mL | Freq: Once | ORAL | Status: DC
  Filled 2015-11-09: qty 5

## 2015-11-09 MED ORDER — PROMETHAZINE HCL 25 MG/ML IJ SOLN
12.5000 mg | Freq: Once | INTRAMUSCULAR | Status: AC
  Administered 2015-11-09: 12.5 mg via INTRAVENOUS
  Filled 2015-11-09: qty 1

## 2015-11-09 NOTE — NC FL2 (Signed)
[place] LEVEL OF CARE SCREENING TOOL     IDENTIFICATION  Patient Name: RUCHIKA ESHBAUGH Birthdate: [birth year] Sex: female Admission Date (Current Location): 11/07/2015  [facility] and [place] Number:  Herbalist and Address:  [facility],  [place] [address], [place]      Provider Number: [id number]  Attending Physician Name and Address:  Albertine Patricia, MD  Relative Name and Phone Number:       Current Level of Care: Hospital Recommended Level of Care: [place] Prior Approval Number:    Date Approved/Denied:   PASRR Number: [id number]  Discharge Plan: SNF    Current Diagnoses: Patient Active Problem List   Diagnosis Date Noted  . COPD exacerbation ([place]) 11/07/2015  . CAP (community acquired pneumonia) 11/07/2015  . Acute respiratory failure with hypoxia ([place]) 11/07/2015  . Acute bronchitis 06/14/2015  . Lower back pain 05/17/2015  . Right hip pain 05/17/2015  . Preventative health care 09/10/2013  . Unspecified asthma(493.90)   . Arthritis   . On home oxygen therapy   . Anxiety   . Depression   . Allergy   . Hx of radiation therapy   . Cancer of central portion of female breast ([place]) 07/06/2012  . COPD (chronic obstructive pulmonary disease) with emphysema ([place]) 03/09/2008  . Essential hypertension 05/01/2007  . Hyperlipidemia 04/10/2007  . GERD 04/10/2007  . Osteoporosis 04/10/2007  . BREAST CANCER, HX OF 04/10/2007    Orientation RESPIRATION BLADDER Height & Weight     Self, Time, Place  O2 (2L O2) Continent Weight: 123 lb 14.4 oz (56.2 kg) Height:  5' 5.5" (166.4 cm)  BEHAVIORAL SYMPTOMS/MOOD NEUROLOGICAL BOWEL NUTRITION STATUS   (n/a)   Continent Diet (Diet 2 gram sodium, thin liquids)  AMBULATORY STATUS COMMUNICATION OF NEEDS Skin   Limited Assist (+2 for safety/equipment) Verbally Normal                       Personal Care Assistance Level of Assistance  Bathing, Feeding,  Dressing Bathing Assistance: Limited assistance Feeding assistance: Independent Dressing Assistance: Limited assistance     Functional Limitations Info  Sight, Hearing, Speech Sight Info: Impaired (wears glasses) Hearing Info: Adequate Speech Info: Adequate    SPECIAL CARE FACTORS FREQUENCY  PT (By licensed PT), OT (By licensed OT)     PT Frequency: 5 x a week OT Frequency: 5 x a week            Contractures Contractures Info: Not present    Additional Factors Info  Allergies   Allergies Info: Neosporin, Codeine, Irbesartan, Adhesive           Current Medications (11/09/2015):  This is the current hospital active medication list Current Facility-Administered Medications  Medication Dose Route Frequency Provider Last Rate Last Dose  . 0.9 %  sodium chloride infusion  250 mL Intravenous PRN Bonnielee Haff, MD 10 mL/hr at 11/08/15 1326 250 mL at 11/08/15 1326  . acetaminophen (TYLENOL) tablet 650 mg  650 mg Oral Q6H PRN Bonnielee Haff, MD   650 mg at 11/07/15 1704   Or  . acetaminophen (TYLENOL) suppository 650 mg  650 mg Rectal Q6H PRN Bonnielee Haff, MD      . albuterol (PROVENTIL) (2.5 MG/3ML) 0.083% nebulizer solution 2.5 mg  2.5 mg Nebulization Q2H PRN Bonnielee Haff, MD   2.5 mg at 11/09/15 1557  . albuterol (PROVENTIL) (2.5 MG/3ML) 0.083% nebulizer solution 2.5 mg  2.5 mg  Nebulization TID Bonnielee Haff, MD   2.5 mg at 11/09/15 0829  . ALPRAZolam Duanne Moron) tablet 0.5 mg  0.5 mg Oral BID PRN Bonnielee Haff, MD   0.5 mg at 11/09/15 1011  . Ampicillin-Sulbactam (UNASYN) 3 g in sodium chloride 0.9 % 100 mL IVPB  3 g Intravenous Q6H Christine E Shade, RPH   3 g at 11/09/15 1522  . aspirin chewable tablet 81 mg  81 mg Oral Daily Bonnielee Haff, MD   81 mg at 11/09/15 1005  . atorvastatin (LIPITOR) tablet 10 mg  10 mg Oral q1800 Bonnielee Haff, MD   10 mg at 11/08/15 1801  . chlorpheniramine-HYDROcodone (TUSSIONEX) 10-8 MG/5ML suspension 5 mL  5 mL Oral Once Jeryl Columbia, NP   5 mL at 11/09/15 0515  . docusate sodium (COLACE) capsule 100 mg  100 mg Oral BID Bonnielee Haff, MD   100 mg at 11/09/15 1005  . enoxaparin (LOVENOX) injection 40 mg  40 mg Subcutaneous QHS Bonnielee Haff, MD   40 mg at 11/08/15 2150  . guaiFENesin (MUCINEX) 12 hr tablet 600 mg  600 mg Oral BID Albertine Patricia, MD   600 mg at 11/09/15 1424  . mometasone-formoterol (DULERA) 100-5 MCG/ACT inhaler 2 puff  2 puff Inhalation BID Bonnielee Haff, MD   2 puff at 11/09/15 0829  . ondansetron (ZOFRAN) tablet 4 mg  4 mg Oral Q6H PRN Bonnielee Haff, MD       Or  . ondansetron (ZOFRAN) injection 4 mg  4 mg Intravenous Q6H PRN Bonnielee Haff, MD   4 mg at 11/09/15 1423  . pantoprazole (PROTONIX) EC tablet 40 mg  40 mg Oral Daily Bonnielee Haff, MD   40 mg at 11/09/15 1005  . [START ON 11/10/2015] predniSONE (DELTASONE) tablet 40 mg  40 mg Oral Q breakfast Albertine Patricia, MD      . saccharomyces boulardii (FLORASTOR) capsule 250 mg  250 mg Oral BID Bonnielee Haff, MD   250 mg at 11/09/15 1005  . sodium chloride flush (NS) 0.9 % injection 3 mL  3 mL Intravenous Q12H Bonnielee Haff, MD   3 mL at 11/07/15 2341  . sodium chloride flush (NS) 0.9 % injection 3 mL  3 mL Intravenous PRN Bonnielee Haff, MD      . tiotropium [facility]) inhalation capsule 18 mcg  18 mcg Inhalation Daily Bonnielee Haff, MD   18 mcg at 11/09/15 0830     Discharge Medications: Please see discharge summary for a list of discharge medications.  Relevant Imaging Results:  Relevant Lab Results:   Additional Information [id number]  KIDD, Hughes Better A, LCSW

## 2015-11-09 NOTE — Clinical Social Work Note (Signed)
Clinical Social Work Assessment  Patient Details  Name: [name] MRN: [id number] Date of Birth: 07/18/1930  Date of referral:  11/09/15               Reason for consult:  Discharge Planning                Permission sought to share information with:  Family Supports Permission granted to share information::  Yes, Verbal Permission Granted  Name::     Jim Desanctis  Agency::     Relationship::  sister  Contact Information:  [phone]  Housing/Transportation Living arrangements for the past 2 months:  Single Family Home Source of Information:  Patient, Other (Comment Required) (sister at bedside) Patient Interpreter Needed:  None Criminal Activity/Legal Involvement Pertinent to Current Situation/Hospitalization:  No - Comment as needed Significant Relationships:  Siblings, Adult Children Lives with:  Self Do you feel safe going back to the place where you live?  No Need for family participation in patient care:  Yes (Comment) (pt sister at bedside)  Care giving concerns:  Pt lives at home alone. PT recommending SNF.    Social Worker assessment / plan:  CSW received referral for New SNF.   CSW met with pt and pt sister, Tye [place] at bedside. CSW introduced self and explained role. Pt expressed having a difficult day. CSW discussed recommendation for short term rehab at [facility]. Pt sister discussed that pt family had looked into [facility] before anything ever happened medically and would be interested in [facility] for pt rehab stay. Pt agreeable to [facility].  CSW completed FL2 and initiated SNF search to [facility]. CSW contacted [facility] and facility reviewed pt information and confirmed that [facility] can offer pt a bed and bed available tomorrow.   CSW met with pt and pt sister to discuss and pt wants to secure bed at [facility].   CSW notified [facility] that pt wants to accept bed offer.  CSW to continue to follow to assist with pt discharge to  [facility] when pt medically ready for discharge.   Employment status:  Retired Nurse, adult PT Recommendations:  [place] / Referral to community resources:  [place]  Patient/Family's Response to care:  Pt alert and oriented x 3. Pt sister supportive and actively involve din pt care. Pt and pt sister pleased that pt can go to [facility].   Patient/Family's Understanding of and Emotional Response to Diagnosis, Current Treatment, and Prognosis:  Pt sister knowledgeable about pt diagnosis and treatment.   Emotional Assessment Appearance:  Appears stated age Attitude/Demeanor/Rapport:  Other (pt appropriate) Affect (typically observed):  Appropriate Orientation:  Oriented to Self, Oriented to Place, Oriented to  Time Alcohol / Substance use:  Not Applicable Psych involvement (Current and /or in the community):  No (Comment)  Discharge Needs  Concerns to be addressed:  Discharge Planning Concerns Readmission within the last 30 days:    Current discharge risk:  Lives alone Barriers to Discharge:  Continued Medical Work up   Ladell Pier, LCSW 11/09/2015, 4:43 PM  [phone]

## 2015-11-09 NOTE — Progress Notes (Signed)
Patient Demographics  [name], is a 82 y.o. female, DOB - 02-Aug-1930, [id number]  Admit date - 11/07/2015   Admitting Physician Bonnielee Haff, MD  Outpatient Primary MD for the patient is Cathlean Cower, MD  LOS - 2   Chief Complaint  Patient presents with  . Cough  . Weakness       Admission HPI/Brief narrative: 82 year old female with past medical history of COPD on 2 L nasal cannula on exertion, hypertension, presents with cough and shortness of breath, significant for pneumonia, and COPD exacerbation, patient has urine antigen positive for strep pneumonia.  Subjective:   [name] today has, No headache, No chest pain, No abdominal pain - No Nausea, complaints of cough, but significantly improved  Assessment & Plan    Principal Problem:   CAP (community acquired pneumonia) Active Problems:   Essential hypertension   COPD (chronic obstructive pulmonary disease) with emphysema (HCC)   BREAST CANCER, HX OF   COPD exacerbation (HCC)   Acute respiratory failure with hypoxia (HCC)  Community-acquired pneumonia - Patient with left lower lobe opacity on chest x-ray, treated initially with levofloxacin, blood cultures remains with no growth today, patient has strep pneumonia antigen positive,range to Unasyn 11/08/2015   Acute on chronic respiratory failure  - Baseline respiratory failure 2 L nasal cannula on exertion , currently continuous 2 L nasal cannula secondary to pneumonia and COPD , try to wean back to baseline .  COPD with mild exacerbation - Continue with nebulizer treatmentcontinues to improve, will change to by mouth prednisone in a.m. , will continue with her inhaled steroid, continue with antibiotics  UTI  - In culture with no growth, continue with Unasyn   Hypertension - Blood pressure remained soft, continue to hold home medication.  Normocytic anemia Hemoglobin  slightly lower than her usual baseline of about 12. Patient denies any overt bleeding.  most likely delusional effect  Code Status: Partial  Family Communication:sister at bedside  Dispositiom; SNF placement  Pocedures none   Consults   none   Medications  Scheduled Meds: . albuterol  2.5 mg Nebulization TID  . ampicillin-sulbactam (UNASYN) IV  3 g Intravenous Q6H  . aspirin  81 mg Oral Daily  . atorvastatin  10 mg Oral q1800  . chlorpheniramine-HYDROcodone  5 mL Oral Once  . docusate sodium  100 mg Oral BID  . enoxaparin (LOVENOX) injection  40 mg Subcutaneous QHS  . guaiFENesin  600 mg Oral BID  . mometasone-formoterol  2 puff Inhalation BID  . pantoprazole  40 mg Oral Daily  . saccharomyces boulardii  250 mg Oral BID  . sodium chloride 0.9 % 50 mL with methylPREDNISolone sodium succinate (SOLU-MEDROL) 125 mg/2 mL 60 mg infusion  60 mg Intravenous Q12H  . sodium chloride flush  3 mL Intravenous Q12H  . tiotropium  18 mcg Inhalation Daily   Continuous Infusions:  PRN Meds:.sodium chloride, acetaminophen **OR** acetaminophen, albuterol, ALPRAZolam, ondansetron **OR** ondansetron (ZOFRAN) IV, sodium chloride flush  DVT Prophylaxis  Lovenox   Lab Results  Component Value Date   PLT 230 11/09/2015    Antibiotics    Anti-infectives    Start     Dose/Rate Route Frequency Ordered Stop   11/09/15 1600  levofloxacin (LEVAQUIN)  IVPB 750 mg  Status:  Discontinued     750 mg 100 mL/hr over 90 Minutes Intravenous Every 48 hours 11/08/15 0033 11/08/15 1442   11/08/15 1600  Ampicillin-Sulbactam (UNASYN) 3 g in sodium chloride 0.9 % 100 mL IVPB     3 g 100 mL/hr over 60 Minutes Intravenous Every 6 hours 11/08/15 1505     11/08/15 1000  levofloxacin (LEVAQUIN) IVPB 750 mg  Status:  Discontinued     750 mg 100 mL/hr over 90 Minutes Intravenous Every 24 hours 11/07/15 2340 11/08/15 0031   11/07/15 1445  levofloxacin (LEVAQUIN) IVPB 750 mg     750 mg 100 mL/hr over 90 Minutes  Intravenous  Once 11/07/15 1441 11/07/15 1805          Objective:   Filed Vitals:   11/08/15 2117 11/09/15 0449 11/09/15 0501 11/09/15 0830  BP: 136/60 126/55    Pulse: 100 82    Temp: 98.5 F (36.9 C) 97.3 F (36.3 C)    TempSrc: Oral Oral    Resp: 20 20    Height:      Weight:      SpO2: 97% 91% 90% 91%    Wt Readings from Last 3 Encounters:  11/08/15 56.2 kg (123 lb 14.4 oz)  11/07/15 57.153 kg (126 lb)  08/31/15 60.782 kg (134 lb)     Intake/Output Summary (Last 24 hours) at 11/09/15 1407 Last data filed at 11/09/15 0626  Gross per 24 hour  Intake   1112 ml  Output    650 ml  Net    462 ml     Physical Exam  Awake Alert, Oriented X 3, No new F.N deficits, Normal affect [place].AT,PERRAL Supple Neck,No JVD, No cervical lymphadenopathy appriciated.  Symmetrical Chest wall movement, Good air movement bilaterally, scattered wheezing RRR,No Gallops,Rubs or new Murmurs, No Parasternal Heave +ve B.Sounds, Abd Soft, No tenderness, No organomegaly appriciated, No rebound - guarding or rigidity. No Cyanosis, Clubbing or edema, No new Rash or bruise     Data Review   Micro Results Recent Results (from the past 240 hour(s))  Culture, blood (routine x 2)     Status: None (Preliminary result)   Collection Time: 11/07/15  1:52 PM  Result Value Ref Range Status   Specimen Description BLOOD RIGHT FOREARM  Final   Special Requests BOTTLES DRAWN AEROBIC AND ANAEROBIC 5CC  Final   Culture   Final    NO GROWTH 2 DAYS Performed at [facility]    Report Status PENDING  Incomplete  Culture, blood (routine x 2)     Status: None (Preliminary result)   Collection Time: 11/07/15  1:56 PM  Result Value Ref Range Status   Specimen Description BLOOD RIGHT ANTECUBITAL  Final   Special Requests BOTTLES DRAWN AEROBIC AND ANAEROBIC 5CC  Final   Culture   Final    NO GROWTH 2 DAYS Performed at [facility]    Report Status PENDING  Incomplete  Urine culture      Status: None   Collection Time: 11/07/15  8:02 PM  Result Value Ref Range Status   Specimen Description URINE, CLEAN CATCH  Final   Special Requests NONE  Final   Culture   Final    MULTIPLE SPECIES PRESENT, SUGGEST RECOLLECTION Performed at [facility]    Report Status 11/09/2015 FINAL  Final    Radiology Reports Dg Chest 2 View  11/07/2015  CLINICAL DATA:  Shortness breath, productive cough 4 days  EXAM: CHEST  2 VIEW COMPARISON:  04/27/2015 FINDINGS: The lungs are hyperinflated likely secondary to COPD. There is chronic elevation of the right diaphragm. There is a small left pleural effusion. There is hazy left lower lobe airspace disease which may reflect atelectasis versus pneumonia. There is no right pleural effusion. The heart and mediastinal contours are unremarkable. There is a large hiatal hernia. The osseous structures are unremarkable. IMPRESSION: There is mild hazy left lower lobe airspace disease concerning for pneumonia versus atelectasis. Electronically Signed   By: Kathreen Devoid   On: 11/07/2015 14:17     CBC  Recent Labs Lab 11/07/15 1351 11/08/15 0648 11/09/15 0347  WBC 8.0 5.1 6.2  HGB 9.7* 10.3* 10.5*  HCT 29.5* 33.0* 34.2*  PLT 206 225 230  MCV 87.8 90.7 92.9  MCH 28.9 28.3 28.5  MCHC 32.9 31.2 30.7  RDW 14.3 14.6 14.6  LYMPHSABS 0.5*  --   --   MONOABS 0.6  --   --   EOSABS 0.0  --   --   BASOSABS 0.0  --   --     Chemistries   Recent Labs Lab 11/07/15 1351 11/08/15 0648 11/09/15 0347  NA 137 142 144  K 3.5 3.8 3.9  CL 99* 107 106  CO2 28 27 32  GLUCOSE 127* 154* 154*  BUN 35* 20 16  CREATININE 1.08* 0.67 0.52  CALCIUM 8.7* 8.7* 8.6*  AST 30 27  --   ALT 16 15  --   ALKPHOS 69 66  --   BILITOT 0.5 0.2*  --    ------------------------------------------------------------------------------------------------------------------ estimated creatinine clearance is 47.3 mL/min (by C-G formula based on Cr of  0.52). ------------------------------------------------------------------------------------------------------------------ No results for input(s): HGBA1C in the last 72 hours. ------------------------------------------------------------------------------------------------------------------ No results for input(s): CHOL, HDL, LDLCALC, TRIG, CHOLHDL, LDLDIRECT in the last 72 hours. ------------------------------------------------------------------------------------------------------------------ No results for input(s): TSH, T4TOTAL, T3FREE, THYROIDAB in the last 72 hours.  Invalid input(s): FREET3 ------------------------------------------------------------------------------------------------------------------  Recent Labs  11/08/15 0648  VITAMINB12 610  FOLATE 14.4  FERRITIN 116  TIBC 220*  IRON 12*  RETICCTPCT 0.6    Coagulation profile No results for input(s): INR, PROTIME in the last 168 hours.  No results for input(s): DDIMER in the last 72 hours.  Cardiac Enzymes No results for input(s): CKMB, TROPONINI, MYOGLOBIN in the last 168 hours.  Invalid input(s): CK ------------------------------------------------------------------------------------------------------------------ Invalid input(s): POCBNP     Time Spent in minutes   25 minutes   ELGERGAWY, DAWOOD M.D on 11/09/2015 at 2:07 PM  Between 7am to 7pm - Pager - [phone]  After 7pm go to [email or web address] - password [facility]  [facility]   Office  [phone]

## 2015-11-09 NOTE — Clinical Social Work Placement (Signed)
   CLINICAL SOCIAL WORK PLACEMENT  NOTE  Date:  11/09/2015  Patient Details  Name: [name] MRN: [id number] Date of Birth: 12-31-1929  Clinical Social Work is seeking post-discharge placement for this patient at the [place] level of care (*CSW will initial, date and re-position this form in  chart as items are completed):  Yes   Patient/family provided with [place] Work Department's list of facilities offering this level of care within the geographic area requested by the patient (or if unable, by the patient's family).  Yes   Patient/family informed of their freedom to choose among providers that offer the needed level of care, that participate in Medicare, Medicaid or managed care program needed by the patient, have an available bed and are willing to accept the patient.  Yes   Patient/family informed of [place]'s ownership interest in [facility] and [facility], as well as of the fact that they are under no obligation to receive care at these facilities.  PASRR submitted to EDS on 11/09/15     PASRR number received on 11/09/15     Existing PASRR number confirmed on       FL2 transmitted to all facilities in geographic area requested by pt/family on 11/09/15     FL2 transmitted to all facilities within larger geographic area on       Patient informed that his/her managed care company has contracts with or will negotiate with certain facilities, including the following:        Yes   Patient/family informed of bed offers received.  Patient chooses bed at [facility]     Physician recommends and patient chooses bed at      Patient to be transferred to [facility] on  .  Patient to be transferred to facility by       Patient family notified on   of transfer.  Name of family member notified:        PHYSICIAN Please sign FL2     Additional Comment:    _______________________________________________ Ladell Pier, LCSW 11/09/2015, 4:48 PM

## 2015-11-10 MED ORDER — GUAIFENESIN ER 600 MG PO TB12: 600.0000 mg | ORAL_TABLET | Freq: Two times a day (BID) | ORAL | Status: DC

## 2015-11-10 MED ORDER — DOCUSATE SODIUM 100 MG PO CAPS: 100.0000 mg | ORAL_CAPSULE | Freq: Two times a day (BID) | ORAL | Status: DC

## 2015-11-10 MED ORDER — ALPRAZOLAM 0.5 MG PO TABS: 0.5000 mg | ORAL_TABLET | Freq: Two times a day (BID) | ORAL | Status: DC | PRN

## 2015-11-10 MED ORDER — ACETAMINOPHEN 325 MG PO TABS: 650.0000 mg | ORAL_TABLET | Freq: Four times a day (QID) | ORAL | Status: AC | PRN

## 2015-11-10 MED ORDER — SACCHAROMYCES BOULARDII 250 MG PO CAPS: 250.0000 mg | ORAL_CAPSULE | Freq: Two times a day (BID) | ORAL | Status: DC

## 2015-11-10 MED ORDER — ALBUTEROL SULFATE (2.5 MG/3ML) 0.083% IN NEBU: 2.5000 mg | INHALATION_SOLUTION | Freq: Three times a day (TID) | RESPIRATORY_TRACT | Status: DC

## 2015-11-10 MED ORDER — HYDROCODONE-HOMATROPINE 5-1.5 MG/5ML PO SYRP: 5.0000 mL | ORAL_SOLUTION | Freq: Four times a day (QID) | ORAL | Status: DC | PRN

## 2015-11-10 MED ORDER — ONDANSETRON HCL 4 MG PO TABS: 4.0000 mg | ORAL_TABLET | Freq: Four times a day (QID) | ORAL | Status: AC | PRN

## 2015-11-10 MED ORDER — PREDNISONE 10 MG PO TABS: 10.0000 mg | ORAL_TABLET | Freq: Every day | ORAL | Status: DC

## 2015-11-10 MED ORDER — AMOXICILLIN-POT CLAVULANATE 875-125 MG PO TABS: 1.0000 | ORAL_TABLET | Freq: Two times a day (BID) | ORAL | Status: AC

## 2015-11-10 NOTE — Progress Notes (Signed)
Pharmacy Antibiotic Note  [name] is a 82 y.o. female admitted on 11/07/2015 with CAP and AECOPD and initially started on Levaquin. Strep pneumo urinary antigen positive and pharmacy consulted to change antibiotics to Unasyn.    2/17:  Sputum culture in process.  CrCl ~40 w/ rounded SCr 1  Plan:  Continue Unasyn 3g IV q6h  F/u renal fxn, sputum culture  F/u transition to PO antibiotics  Height: 5' 5.5" (166.4 cm) Weight: 123 lb 14.4 oz (56.2 kg) IBW/kg (Calculated) : 58.15  Temp (24hrs), Avg:97.6 F (36.4 C), Min:97.4 F (36.3 C), Max:97.9 F (36.6 C)   Recent Labs Lab 11/07/15 1351 11/07/15 1410 11/07/15 1603 11/08/15 0648 11/09/15 0347  WBC 8.0  --   --  5.1 6.2  CREATININE 1.08*  --   --  0.67 0.52  LATICACIDVEN  --  1.36 0.99  --   --     Estimated Creatinine Clearance: 47.3 mL/min (by C-G formula based on Cr of 0.52).    Allergies  Allergen Reactions  . Neosporin [Neomycin-Polymyxin B Gu] Itching and Rash  . Codeine Nausea Only  . Irbesartan Hives    Avapro  . Adhesive [Tape] Itching and Rash    To paper tape only. Rash and itching only where applied.    Antimicrobials this admission: 2/14 >> Levaquin >> 2/15 2/15 >> Unasyn >>  Dose adjustments this admission: N/A  Microbiology results: 2/14 BCx: NGTD 2/14 UCx: multiple species, suggest recollection  2/14 Strep pneumo Ur Ag: positive 2/14 Legionella Ur Ag: negative 2/16 Sputum: IP  Thank you for allowing pharmacy to be a part of this patient's care.  Ralene Bathe, PharmD, BCPS 11/10/2015, 8:27 AM  Pager: [phone]

## 2015-11-10 NOTE — Progress Notes (Signed)
Patient d/c to [facility], transferred via non emergency ambulance. Patient is stable, denied pain.

## 2015-11-10 NOTE — Care Management Important Message (Signed)
Important Message  Patient Details  Name: JOBETH FURTAW MRN: [id number] Date of Birth: [birth year]   Medicare Important Message Given:  Yes    Camillo Flaming 11/10/2015, 12:41 [place] Message  Patient Details  Name: RASHAUN CASSONE MRN: [id number] Date of Birth: 1930-04-07   Medicare Important Message Given:  Yes    Camillo Flaming 11/10/2015, 12:41 PM

## 2015-11-10 NOTE — Progress Notes (Signed)
Report given to LPN at [facility]. Patient will be transferred later this day.

## 2015-11-10 NOTE — Clinical Social Work Placement (Signed)
   CLINICAL SOCIAL WORK PLACEMENT  NOTE  Date:  11/10/2015  Patient Details  Name: [name] MRN: [id number] Date of Birth: 1930-05-04  Clinical Social Work is seeking post-discharge placement for this patient at the [place] level of care (*CSW will initial, date and re-position this form in  chart as items are completed):  Yes   Patient/family provided with [place] Work Department's list of facilities offering this level of care within the geographic area requested by the patient (or if unable, by the patient's family).  Yes   Patient/family informed of their freedom to choose among providers that offer the needed level of care, that participate in Medicare, Medicaid or managed care program needed by the patient, have an available bed and are willing to accept the patient.  Yes   Patient/family informed of [place]'s ownership interest in [facility] and [facility], as well as of the fact that they are under no obligation to receive care at these facilities.  PASRR submitted to EDS on 11/09/15     PASRR number received on 11/09/15     Existing PASRR number confirmed on       FL2 transmitted to all facilities in geographic area requested by pt/family on 11/09/15     FL2 transmitted to all facilities within larger geographic area on       Patient informed that his/her managed care company has contracts with or will negotiate with certain facilities, including the following:        Yes   Patient/family informed of bed offers received.  Patient chooses bed at [facility]     Physician recommends and patient chooses bed at      Patient to be transferred to [facility] on 11/10/15.  Patient to be transferred to facility by ambuance Corey Harold)     Patient family notified on 11/10/15 of transfer.  Name of family member notified:  pt and pt sister, Tye [place] notified at bedside     PHYSICIAN Please sign FL2     Additional Comment:     _______________________________________________ Ladell Pier, LCSW 11/10/2015, 1:46 PM

## 2015-11-10 NOTE — Discharge Summary (Signed)
[name], is a 82 y.o. female  DOB [birth year]  MRN [id number].  Admission date:  11/07/2015  Admitting Physician  Bonnielee Haff, MD  Discharge Date:  11/10/2015   Primary MD  Cathlean Cower, MD  Recommendations for primary care physician for things to follow:  - Please check CBC, BMP in 1 week. - Repeat 2 view chest x-ray in 3-4 weeks  CODE STATUS; DO NOT RESUSCITATE, confirmed by patient  Admission Diagnosis  CAP (community acquired pneumonia) [J18.9] COPD with acute exacerbation ([place]) [J44.1] Acute on chronic respiratory failure with hypoxia ([place]) [J96.21]   Discharge Diagnosis  CAP (community acquired pneumonia) [J18.9] COPD with acute exacerbation ([place]) [J44.1] Acute on chronic respiratory failure with hypoxia ([place]) [J96.21]   Principal Problem:   CAP (community acquired pneumonia) Active Problems:   Essential hypertension   COPD (chronic obstructive pulmonary disease) with emphysema (HCC)   BREAST CANCER, HX OF   COPD exacerbation ([place])   Acute respiratory failure with hypoxia ([place])      Past Medical History  Diagnosis Date  . Other emphysema ([place])   . Unspecified essential hypertension   . Osteoporosis, unspecified   . Other and unspecified hyperlipidemia   . Esophageal reflux   . Unspecified asthma(493.90)   . Cancer ([place])   . Arthritis   . Emphysema of lung ([place])   . PONV (postoperative nausea and vomiting)   . On home oxygen therapy     at night  . Full dentures   . Wears glasses   . History of radiation therapy 07/01/06-08/13/06    left breast  total dose 6100 cGy  . Breast cancer ([place]) 04/11/06 bx    left breast invasive cductal ca  . 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 Anxiety   . Depression   . Emphysema   . Cataract     extraction  . Allergy   . Hx of radiation therapy  08/31/12-09/28/12    right breast     Past Surgical History  Procedure Laterality Date  . Partial hysterectomy    . Cataract extraction    . Hip surgery  1993    left  . Breast surgery    . Cyst on face  6 months ago  . Mass excision  02/18/2012    Procedure: MINOR EXCISION OF MASS;  Surgeon: Adin Hector, MD;  Location: [facility];  Service: General;  Laterality: Left;  excise 2.5 cm. mass left chest wall  . Mastectomy  05/14/06    lt lumpectomy-node dissDr.Leone  . Breast lumpectomy  07/13/12    right -DCIS 7 o'clock stage Tis,Nx  . Abdominal hysterectomy      partial       History of present illness and  Hospital Course:     Kindly see H&P for history of present illness and admission details, please review complete Labs, Consult reports and Test reports for all details in brief  HPI  from the history and physical done on the day of admission 11/07/2015 HPI: [name] is a 82 y.o. female with a past medical history of COPD on home oxygen at 2 L/m, hypertension, who was in her usual state of health this past Saturday when she started having a cough with worsening shortness of breath. Initially the sputum was white in color. However, in the last 24 hours it has become yellow. She denies any blood in the sputum. Denies any fever but has had chills. No nausea, vomiting. Denies any chest pain. She went to the pulmonology office today and she was referred to the hospital for further management. Was noted to have lower oxygen saturation levels on 2 liters and had to bump up her oxygen 4 L. Denies any dizziness or lightheadedness.   Hospital Course  82 year old female with past medical history of COPD on 2 L nasal cannula on exertion, hypertension, presents with cough and shortness of breath, significant for pneumonia, and COPD exacerbation, patient has urine antigen positive for strep pneumonia  Community-acquired pneumonia - Patient with left lower lobe opacity on  chest x-ray, treated initially with levofloxacin, patient has strep pneumonia antigen positive, so transitioned to Unasyn 11/08/2015 , to finish 4 days of oral Augmentin as an outpatient, repeat 2 view chest x-ray in 3-4 weeks.  Acute on chronic respiratory failure  - Baseline respiratory failure 2 L nasal cannula on exertion , currently continuous 2 L nasal cannula secondary to pneumonia and COPD .  COPD with mild exacerbation - Continue with nebulizer treatment, continues to improve, positioned from IV steroids to by mouth prednisone, to be discharged on tapering dose, continue with home medication includes Symbicort and Spiriva, and nebs .  UTI  - Urine culture with no growth, initially on levofloxacin, then transitioned to Unasyn.  Hypertension -Initially blood pressure on the lower side, medication has been held, but blood pressure started to increase, we'll discharge on home medication  Normocytic anemia Hemoglobin slightly lower than her usual baseline of about 12. No evidence of any overt bleeding. most likely delusional effect, remains stable.    Discharge Condition:  stable   Follow UP  Follow-up Information    Follow up with [facility].   Specialty:  [place] information:   [address] [place] [phone]      Follow up with Cathlean Cower, MD.   Specialties:  Internal Medicine, Radiology   Why:  After discharge from SNF   Contact information:   [place] [address] [phone]         Discharge Instructions  and  Discharge Medications     Discharge Instructions    Diet - low sodium heart healthy    Complete by:  As directed      Discharge instructions    Complete by:  As directed   Follow with Primary MD Cathlean Cower, MD after discharge from SNF  Get CBC, CMP, 2 view Chest X ray checked  by Primary MD next visit.    Activity: As tolerated with Full fall  precautions use walker/cane & assistance as needed   Disposition Home    Diet: Heart Healthy  , with feeding assistance and aspiration precautions.  For Heart failure patients - Check your Weight same time everyday, if you gain over 2 pounds, or you develop in leg swelling, experience more shortness of breath or chest pain, call your Primary MD  immediately. Follow Cardiac Low Salt Diet and 1.5 lit/day fluid restriction.   On your next visit with your primary care physician please Get Medicines reviewed and adjusted.   Please request your Prim.MD to go over all Hospital Tests and Procedure/Radiological results at the follow up, please get all Hospital records sent to your Prim MD by signing hospital release before you go home.   If you experience worsening of your admission symptoms, develop shortness of breath, life threatening emergency, suicidal or homicidal thoughts you must seek medical attention immediately by calling 911 or calling your MD immediately  if symptoms less severe.  You Must read complete instructions/literature along with all the possible adverse reactions/side effects for all the Medicines you take and that have been prescribed to you. Take any new Medicines after you have completely understood and accpet all the possible adverse reactions/side effects.   Do not drive, operating heavy machinery, perform activities at heights, swimming or participation in water activities or provide baby sitting services if your were admitted for syncope or siezures until you have seen by Primary MD or a Neurologist and advised to do so again.  Do not drive when taking Pain medications.    Do not take more than prescribed Pain, Sleep and Anxiety Medications  Special Instructions: If you have smoked or chewed Tobacco  in the last 2 yrs please stop smoking, stop any regular Alcohol  and or any Recreational drug use.  Wear Seat belts while driving.   Please note  You were cared for  by a hospitalist during your hospital stay. If you have any questions about your discharge medications or the care you received while you were in the hospital after you are discharged, you can call the unit and asked to speak with the hospitalist on call if the hospitalist that took care of you is not available. Once you are discharged, your primary care physician will handle any further medical issues. Please note that NO REFILLS for any discharge medications will be authorized once you are discharged, as it is imperative that you return to your primary care physician (or establish a relationship with a primary care physician if you do not have one) for your aftercare needs so that they can reassess your need for medications and monitor your lab values.     Increase activity slowly    Complete by:  As directed             Medication List    TAKE these medications        acetaminophen 325 MG tablet  Commonly known as:  TYLENOL  Take 2 tablets (650 mg total) by mouth every 6 (six) hours as needed for mild pain (or Fever >/= 101).     albuterol 108 (90 Base) MCG/ACT inhaler  Commonly known as:  PROVENTIL HFA  Inhale 2 puffs into the lungs every 6 (six) hours as needed.     albuterol (2.5 MG/3ML) 0.083% nebulizer solution  Commonly known as:  PROVENTIL  Take 3 mLs (2.5 mg total) by nebulization 3 (three) times daily.     ALPRAZolam 0.5 MG tablet  Commonly known as:  XANAX  Take 1 tablet (0.5 mg total) by mouth 2 (two) times daily as needed for anxiety.     amLODipine 5 MG tablet  Commonly known as:  NORVASC  TAKE 1 TABLET (5 MG TOTAL) BY MOUTH DAILY.     amoxicillin-clavulanate 875-125 MG tablet  Commonly known as:  AUGMENTIN  Take 1  tablet by mouth 2 (two) times daily. Please take for 4 days then stop.     aspirin 81 MG tablet  Take 81 mg by mouth daily.     atorvastatin 10 MG tablet  Commonly known as:  LIPITOR  TAKE 1 TABLET (10 MG TOTAL) BY MOUTH DAILY.     docusate sodium  100 MG capsule  Commonly known as:  COLACE  Take 1 capsule (100 mg total) by mouth 2 (two) times daily.     Fluticasone-Salmeterol 250-50 MCG/DOSE Aepb  Commonly known as:  ADVAIR DISKUS  INHALE 1 PUFF INTO THE LUNGS EVERY 12 HOURS     guaiFENesin 600 MG 12 hr tablet  Commonly known as:  MUCINEX  Take 1 tablet (600 mg total) by mouth 2 (two) times daily. Please take for 10 days     HYDROcodone-homatropine 5-1.5 MG/5ML syrup  Commonly known as:  HYCODAN  Take 5 mLs by mouth every 6 (six) hours as needed for cough.     omeprazole 20 MG capsule  Commonly known as:  PRILOSEC  Take 1 capsule (20 mg total) by mouth daily.     ondansetron 4 MG tablet  Commonly known as:  ZOFRAN  Take 1 tablet (4 mg total) by mouth every 6 (six) hours as needed for nausea.     predniSONE 10 MG tablet  Commonly known as:  DELTASONE  Take 1 tablet (10 mg total) by mouth daily with breakfast. Please take 4 tablets daily for 2 days, then 3 tablets daily for 2 days, then 2 tablets daily for 2 days, then 1 tablet daily for 2 days, then stop.     ramipril 10 MG capsule  Commonly known as:  ALTACE  TAKE 1 CAPSULE (10 MG TOTAL) BY MOUTH DAILY.     saccharomyces boulardii 250 MG capsule  Commonly known as:  FLORASTOR  Take 250 mg by mouth every morning.     saccharomyces boulardii 250 MG capsule  Commonly known as:  FLORASTOR  Take 1 capsule (250 mg total) by mouth 2 (two) times daily. Please take for 10 days then stop     tiotropium 18 MCG inhalation capsule  Commonly known as:  SPIRIVA HANDIHALER  PLACE 1 CAPSULE (18 MCG TOTAL) INTO INHALER AND INHALE DAILY.     Vitamin D 2000 units tablet  Take 2,000 Units by mouth daily.          Diet and Activity recommendation: See Discharge Instructions above   Consults obtained -  none Major procedures and Radiology Reports - PLEASE review detailed and final reports for all details, in brief -      Dg Chest 2 View  11/07/2015  CLINICAL DATA:   Shortness breath, productive cough 4 days EXAM: CHEST  2 VIEW COMPARISON:  04/27/2015 FINDINGS: The lungs are hyperinflated likely secondary to COPD. There is chronic elevation of the right diaphragm. There is a small left pleural effusion. There is hazy left lower lobe airspace disease which may reflect atelectasis versus pneumonia. There is no right pleural effusion. The heart and mediastinal contours are unremarkable. There is a large hiatal hernia. The osseous structures are unremarkable. IMPRESSION: There is mild hazy left lower lobe airspace disease concerning for pneumonia versus atelectasis. Electronically Signed   By: Kathreen Devoid   On: 11/07/2015 14:17    Micro Results     Recent Results (from the past 240 hour(s))  Culture, blood (routine x 2)     Status: None (Preliminary result)  Collection Time: 11/07/15  1:52 PM  Result Value Ref Range Status   Specimen Description BLOOD RIGHT FOREARM  Final   Special Requests BOTTLES DRAWN AEROBIC AND ANAEROBIC 5CC  Final   Culture   Final    NO GROWTH 2 DAYS Performed at [facility]    Report Status PENDING  Incomplete  Culture, blood (routine x 2)     Status: None (Preliminary result)   Collection Time: 11/07/15  1:56 PM  Result Value Ref Range Status   Specimen Description BLOOD RIGHT ANTECUBITAL  Final   Special Requests BOTTLES DRAWN AEROBIC AND ANAEROBIC 5CC  Final   Culture   Final    NO GROWTH 2 DAYS Performed at [facility]    Report Status PENDING  Incomplete  Urine culture     Status: None   Collection Time: 11/07/15  8:02 PM  Result Value Ref Range Status   Specimen Description URINE, CLEAN CATCH  Final   Special Requests NONE  Final   Culture   Final    MULTIPLE SPECIES PRESENT, SUGGEST RECOLLECTION Performed at [facility]    Report Status 11/09/2015 FINAL  Final  Culture, sputum-assessment     Status: None   Collection Time: 11/09/15  3:01 PM  Result Value Ref Range Status   Specimen  Description SPUTUM  Final   Special Requests NONE  Final   Sputum evaluation THIS SPECIMEN IS ACCEPTABLE FOR SPUTUM CULTURE  Final   Report Status 11/09/2015 FINAL  Final  Culture, respiratory (NON-Expectorated)     Status: None (Preliminary result)   Collection Time: 11/09/15  3:01 PM  Result Value Ref Range Status   Specimen Description SPUTUM  Final   Special Requests NONE  Final   Gram Stain   Final    NO WBC SEEN ABUNDANT SQUAMOUS EPITHELIAL CELLS PRESENT RARE GRAM POSITIVE COCCI IN PAIRS RARE YEAST Performed at [facility]    Culture PENDING  Incomplete   Report Status PENDING  Incomplete       Today   Subjective:   [name] today has no headache,no chest abdominal pain,no new weakness tingling or numbness, feels much better  today  Objective:   Blood pressure 123/62, pulse 84, temperature 97.9 F (36.6 C), temperature source Oral, resp. rate 18, height 5' 5.5" (1.664 m), weight 56.2 kg (123 lb 14.4 oz), SpO2 95 %.   Intake/Output Summary (Last 24 hours) at 11/10/15 1105 Last data filed at 11/10/15 1022  Gross per 24 hour  Intake    682 ml  Output    400 ml  Net    282 ml    Exam Awake Alert, Oriented X 3, No new F.N deficits, Normal affect [place].AT,PERRAL Supple Neck,No JVD, No cervical lymphadenopathy appriciated.  Symmetrical Chest wall movement, Good air movement bilaterally, no wheezing RRR,No Gallops,Rubs or new Murmurs, No Parasternal Heave +ve B.Sounds, Abd Soft, No tenderness, No organomegaly appriciated, No rebound - guarding or rigidity. No Cyanosis, Clubbing or edema, No new Rash or bruise   Data Review   CBC w Diff: Lab Results  Component Value Date   WBC 6.2 11/09/2015   WBC 7.6 03/28/2015   WBC 6.6 11/06/2007   HGB 10.5* 11/09/2015   HGB 11.9 03/28/2015   HGB 13.2 11/06/2007   HCT 34.2* 11/09/2015   HCT 36.7 03/28/2015   HCT 41.5 11/06/2007   PLT 230 11/09/2015   PLT 197 03/28/2015   PLT 203 11/06/2007   LYMPHOPCT 6  11/07/2015   LYMPHOPCT 16.6 03/28/2015   LYMPHOPCT 14.6 11/06/2007   MONOPCT 7 11/07/2015   MONOPCT 5.7 03/28/2015   MONOPCT 4.4 11/06/2007   EOSPCT 0 11/07/2015   EOSPCT 3.0 03/28/2015   EOSPCT 1.9 11/06/2007   BASOPCT 0 11/07/2015   BASOPCT 0.4 03/28/2015   BASOPCT 0.1 11/06/2007    CMP: Lab Results  Component Value Date   NA 144 11/09/2015   NA 142 07/26/2014   K 3.9 11/09/2015   K 3.5 07/26/2014   CL 106 11/09/2015   CL 98 07/26/2014   CO2 32 11/09/2015   CO2 30 07/26/2014   BUN 16 11/09/2015   BUN 13 07/26/2014   CREATININE 0.52 11/09/2015   CREATININE 0.5* 07/26/2014   PROT 6.5 11/08/2015   PROT 7.1 07/26/2014   ALBUMIN 2.5* 11/08/2015   ALBUMIN 3.5 07/26/2014   BILITOT 0.2* 11/08/2015   BILITOT 0.60 07/26/2014   ALKPHOS 66 11/08/2015   ALKPHOS 84 07/26/2014   AST 27 11/08/2015   AST 22 07/26/2014   ALT 15 11/08/2015   ALT 15 07/26/2014  .   Total Time in preparing paper work, data evaluation and todays exam - 35 minutes  [name] M.D on 11/10/2015 at 11:05 AM  [facility]   Office  [phone]

## 2015-11-10 NOTE — Discharge Instructions (Signed)
Follow with Primary MD Cathlean Cower, MD after discharge from SNF  Get CBC, CMP, 2 view Chest X ray checked  by Primary MD next visit.    Activity: As tolerated with Full fall precautions use walker/cane & assistance as needed   Disposition Home    Diet: Heart Healthy  , with feeding assistance and aspiration precautions.  For Heart failure patients - Check your Weight same time everyday, if you gain over 2 pounds, or you develop in leg swelling, experience more shortness of breath or chest pain, call your Primary MD immediately. Follow Cardiac Low Salt Diet and 1.5 lit/day fluid restriction.   On your next visit with your primary care physician please Get Medicines reviewed and adjusted.   Please request your Prim.MD to go over all Hospital Tests and Procedure/Radiological results at the follow up, please get all Hospital records sent to your Prim MD by signing hospital release before you go home.   If you experience worsening of your admission symptoms, develop shortness of breath, life threatening emergency, suicidal or homicidal thoughts you must seek medical attention immediately by calling 911 or calling your MD immediately  if symptoms less severe.  You Must read complete instructions/literature along with all the possible adverse reactions/side effects for all the Medicines you take and that have been prescribed to you. Take any new Medicines after you have completely understood and accpet all the possible adverse reactions/side effects.   Do not drive, operating heavy machinery, perform activities at heights, swimming or participation in water activities or provide baby sitting services if your were admitted for syncope or siezures until you have seen by Primary MD or a Neurologist and advised to do so again.  Do not drive when taking Pain medications.    Do not take more than prescribed Pain, Sleep and Anxiety Medications  Special Instructions: If you have smoked or chewed  Tobacco  in the last 2 yrs please stop smoking, stop any regular Alcohol  and or any Recreational drug use.  Wear Seat belts while driving.   Please note  You were cared for by a hospitalist during your hospital stay. If you have any questions about your discharge medications or the care you received while you were in the hospital after you are discharged, you can call the unit and asked to speak with the hospitalist on call if the hospitalist that took care of you is not available. Once you are discharged, your primary care physician will handle any further medical issues. Please note that NO REFILLS for any discharge medications will be authorized once you are discharged, as it is imperative that you return to your primary care physician (or establish a relationship with a primary care physician if you do not have one) for your aftercare needs so that they can reassess your need for medications and monitor your lab values.

## 2015-11-10 NOTE — Progress Notes (Signed)
Pt for discharge to [facility].  CSW facilitated pt discharge needs including contacting facility, faxing pt discharge information via epic hub, discussing with pt and pt sister at bedside, providing RN phone number to call report, and arranging ambulance transport for pt to [facility].  Pt and pt sister appreciative of CSW support and assistance.   No further social work needs identified at this time.  CSW signing off.   Alison Murray, MSW, [place] Work [phone]

## 2015-11-12 LAB — CULTURE, BLOOD (ROUTINE X 2)
CULTURE: NO GROWTH
Culture: NO GROWTH

## 2015-11-12 LAB — CULTURE, RESPIRATORY: GRAM STAIN: NONE SEEN

## 2015-11-14 ENCOUNTER — Encounter: Payer: Self-pay | Admitting: Internal Medicine

## 2015-11-14 ENCOUNTER — Other Ambulatory Visit: Payer: Self-pay | Admitting: *Deleted

## 2015-11-14 ENCOUNTER — Non-Acute Institutional Stay (SKILLED_NURSING_FACILITY): Payer: Medicare Other | Admitting: Internal Medicine

## 2015-11-14 DIAGNOSIS — F419 Anxiety disorder, unspecified: Secondary | ICD-10-CM

## 2015-11-14 DIAGNOSIS — J441 Chronic obstructive pulmonary disease with (acute) exacerbation: Secondary | ICD-10-CM

## 2015-11-14 DIAGNOSIS — D638 Anemia in other chronic diseases classified elsewhere: Secondary | ICD-10-CM | POA: Diagnosis not present

## 2015-11-14 DIAGNOSIS — I10 Essential (primary) hypertension: Secondary | ICD-10-CM | POA: Diagnosis not present

## 2015-11-14 DIAGNOSIS — J189 Pneumonia, unspecified organism: Secondary | ICD-10-CM

## 2015-11-14 DIAGNOSIS — K219 Gastro-esophageal reflux disease without esophagitis: Secondary | ICD-10-CM | POA: Diagnosis not present

## 2015-11-14 DIAGNOSIS — J9621 Acute and chronic respiratory failure with hypoxia: Secondary | ICD-10-CM | POA: Diagnosis not present

## 2015-11-14 DIAGNOSIS — R5381 Other malaise: Secondary | ICD-10-CM

## 2015-11-14 DIAGNOSIS — E46 Unspecified protein-calorie malnutrition: Secondary | ICD-10-CM

## 2015-11-14 MED ORDER — ALPRAZOLAM 0.5 MG PO TABS: ORAL_TABLET | ORAL | Status: DC

## 2015-11-14 NOTE — Telephone Encounter (Signed)
[facility] 

## 2015-11-14 NOTE — Progress Notes (Signed)
Patient ID: [name], female   DOB: [birth year], 82 y.o.   MRN: [id number]    LOCATION: [facility]  PCP: Cathlean Cower, MD   Code Status: Full Code  Goals of care: Advanced Directive information Advanced Directives 11/07/2015  Does patient have an advance directive? Yes  Type of Paramedic of [place];Living will  Does patient want to make changes to advanced directive? No - Patient declined  Copy of advanced directive(s) in chart? No - copy requested       Extended Emergency Contact Information Primary Emergency Contact: McDaniel,Cathy Address: [address]          [place], [place] [address] [place] Phone: [phone] Mobile Phone: [phone] Relation: Sister Secondary Emergency Contact: Sheilah Pigeon [place] Phone: [phone] Relation: Son   Allergies  Allergen Reactions  . Neosporin [Neomycin-Polymyxin B Gu] Itching and Rash  . Codeine Nausea Only  . Irbesartan Hives    Avapro  . Adhesive [Tape] Itching and Rash    To paper tape only. Rash and itching only where applied.    Chief Complaint  Patient presents with  . New Admit To SNF    New Admission     HPI:  Patient is a 82 y.o. female seen today for short term rehabilitation post hospital admission from 11/07/15-11/10/15 with acute respiratory failure with hypoxia in setting of copd exacerbation and community acquired pneumonia. She received iv antibiotic for streptococcus pneumonia to left lower lobe and was transitioned to po antibiotic at discharge. She also received iv steroids and is now on tapering course of po prednisone but refuses to take her oral steroid. She mentions that she is very sensitive to prednisone and gets agitated and does not feel good with high doses above 10 mg. Her sister who is her POA is at bedside and agrees with this. She is currently dyspneic, using her accessory muscles and complaints of air hunger. She  has PMH of HTN, COPD, GERD, osteoporosis among other.  Review of Systems:  Constitutional: Negative for fever, chills, diaphoresis. has fatigue and generalized weakness.  HENT: Negative for headache, congestion, nasal discharge, sore throat, difficulty swallowing.   Eyes: Negative for blurred vision, double vision and discharge.  Respiratory: positive for cough, shortness of breath. Denies wheezing.   Cardiovascular: Negative for chest pain, leg swelling. positive for palpitation Gastrointestinal: Negative for heartburn, nausea, vomiting, abdominal pain Genitourinary: Negative for dysuria Musculoskeletal: Negative for fall Skin: Negative for itching, rash.  Neurological: positive for occasional dizziness Psychiatric/Behavioral: positive for anxiety   Past Medical History  Diagnosis Date  . Other emphysema ([facility])   . Unspecified essential hypertension   . Osteoporosis, unspecified   . Other and unspecified hyperlipidemia   . Esophageal reflux   . Unspecified asthma(493.90)   . Cancer ([place])   . Arthritis   . Emphysema of lung ([employer])   . PONV (postoperative nausea and vomiting)   . On home oxygen therapy     at night  . Full dentures   . Wears glasses   . History of radiation therapy 07/01/06-08/13/06    left breast  total dose 6100 cGy  . Breast cancer ([facility]) 04/11/06 bx    left breast invasive cductal ca  . 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 Anxiety   . Depression   . Emphysema   . Cataract     extraction  .  Allergy   . Hx of radiation therapy 08/31/12-09/28/12    right breast    Past Surgical History  Procedure Laterality Date  . Partial hysterectomy    . Cataract extraction    . Hip surgery  1993    left  . Breast surgery    . Cyst on face  6 months ago  . Mass excision  02/18/2012    Procedure: MINOR EXCISION OF MASS;  Surgeon: Adin Hector, MD;  Location: [facility];   Service: General;  Laterality: Left;  excise 2.5 cm. mass left chest wall  . Mastectomy  05/14/06    lt lumpectomy-node dissDr.Leone  . Breast lumpectomy  07/13/12    right -DCIS 7 o'clock stage Tis,Nx  . Abdominal hysterectomy      partial   Social History:   reports that she quit smoking about 20 years ago. Her smoking use included Cigarettes. She started smoking about 62 years ago. She has a 64.5 pack-year smoking history. She has never used smokeless tobacco. She reports that she does not drink alcohol or use illicit drugs.  Family History  Problem Relation Age of Onset  . Coronary artery disease Mother   . Diabetes Father   . Cancer Father     prostate cancer  . Breast cancer Sister   . Cancer Sister   . Kidney cancer Brother   . COPD      Medications:   Medication List       This list is accurate as of: 11/14/15  9:35 AM.  Always use your most recent med list.               acetaminophen 325 MG tablet  Commonly known as:  TYLENOL  Take 2 tablets (650 mg total) by mouth every 6 (six) hours as needed for mild pain (or Fever >/= 101).     albuterol 108 (90 Base) MCG/ACT inhaler  Commonly known as:  PROVENTIL HFA  Inhale 2 puffs into the lungs every 6 (six) hours as needed.     albuterol (2.5 MG/3ML) 0.083% nebulizer solution  Commonly known as:  PROVENTIL  Take 3 mLs (2.5 mg total) by nebulization 3 (three) times daily.     ALPRAZolam 0.5 MG tablet  Commonly known as:  XANAX  Take 1 tablet (0.5 mg total) by mouth 2 (two) times daily as needed for anxiety.     amLODipine 5 MG tablet  Commonly known as:  NORVASC  TAKE 1 TABLET (5 MG TOTAL) BY MOUTH DAILY.     amoxicillin-clavulanate 875-125 MG tablet  Commonly known as:  AUGMENTIN  Take 1 tablet by mouth 2 (two) times daily. Stop date 11/15/15     aspirin 81 MG chewable tablet  Chew 81 mg by mouth daily.     atorvastatin 10 MG tablet  Commonly known as:  LIPITOR  TAKE 1 TABLET (10 MG TOTAL) BY MOUTH DAILY.      budesonide-formoterol 160-4.5 MCG/ACT inhaler  Commonly known as:  SYMBICORT  Inhale 2 puffs into the lungs 2 (two) times daily.     docusate sodium 100 MG capsule  Commonly known as:  COLACE  Take 1 capsule (100 mg total) by mouth 2 (two) times daily.     guaiFENesin 600 MG 12 hr tablet  Commonly known as:  MUCINEX  Take 600 mg by mouth every 12 (twelve) hours. Stop date 11/20/15     HYDROcodone-homatropine 5-1.5 MG/5ML syrup  Commonly known as:  HYCODAN  Take 5 mLs by mouth every 6 (six) hours as needed for cough.     omeprazole 20 MG capsule  Commonly known as:  PRILOSEC  Take 1 capsule (20 mg total) by mouth daily.     ondansetron 4 MG tablet  Commonly known as:  ZOFRAN  Take 1 tablet (4 mg total) by mouth every 6 (six) hours as needed for nausea.     OXYGEN  Inhale 2 L into the lungs as needed (with exertion to maintain SaO2 at >/ 88%).     predniSONE 10 MG tablet  Commonly known as:  DELTASONE  Take 10 mg by mouth. Take 3 tablets by mouth with breakfast for 2 days. Stop date 11/14/15. Take 2 tablets by mouth with breakfast for 2 days. Stop date 11/16/15. Take 1 tablet by mouth daily with breakfast for 2 days. Stop date 11/18/15. Dx COPD w/CAP     ramipril 10 MG capsule  Commonly known as:  ALTACE  TAKE 1 CAPSULE (10 MG TOTAL) BY MOUTH DAILY.     saccharomyces boulardii 250 MG capsule  Commonly known as:  FLORASTOR  Take 1 capsule (250 mg total) by mouth 2 (two) times daily. Please take for 10 days then stop     tiotropium 18 MCG inhalation capsule  Commonly known as:  SPIRIVA HANDIHALER  PLACE 1 CAPSULE (18 MCG TOTAL) INTO INHALER AND INHALE DAILY.     Vitamin D 2000 units tablet  Take 2,000 Units by mouth daily.        Immunizations: Immunization History  Administered Date(s) Administered  . Influenza Split 07/15/2011, 06/03/2012  . Influenza Whole 11/21/2005, 06/24/2007, 06/15/2008, 07/12/2009, 06/13/2010  . Influenza,inj,Quad PF,36+ Mos 06/23/2013,  06/16/2014, 07/25/2015  . Influenza-Unspecified 11/10/2015  . Pneumococcal Conjugate-13 09/10/2013  . Pneumococcal Polysaccharide-23 09/24/2003  . Pneumococcal-Unspecified 11/10/2015  . Td 09/23/2002     Physical Exam Filed Vitals:   11/14/15 0857  BP: 130/80  Pulse: 76  Temp: 98.1 F (36.7 C)  TempSrc: Oral  Resp: 20  Height: 5\' 5"  (1.651 m)  SpO2: 96%    General- elderly female, frail and thin built, in no acute distress Head- normocephalic, atraumatic Nose- no maxillary or frontal sinus tenderness, no nasal discharge Throat- moist mucus membrane Eyes- PERRLA, EOMI, no pallor, no icterus, no discharge, normal conjunctiva, normal sclera Neck- no cervical lymphadenopathy Cardiovascular- tachycardic, no murmurs, no leg edema Respiratory- bilateral poor air entry, rhonchi +, no wheeze, no crackles, using her accessory muscles, on 2 l o2 Abdomen- bowel sounds present, soft, non tender Musculoskeletal- able to move all 4 extremities, generalized weakness Neurological- alert and oriented to person, place and time Skin- warm and dry, no diaphoresis Psychiatry- anxious    Labs reviewed: Basic Metabolic Panel:  Recent Labs  11/07/15 1351 11/08/15 0648 11/09/15 0347  NA 137 142 144  K 3.5 3.8 3.9  CL 99* 107 106  CO2 28 27 32  GLUCOSE 127* 154* 154*  BUN 35* 20 16  CREATININE 1.08* 0.67 0.52  CALCIUM 8.7* 8.7* 8.6*   Liver Function Tests:  Recent Labs  05/17/15 1522 11/07/15 1351 11/08/15 0648  AST 15 30 27   ALT 10 16 15   ALKPHOS 130* 69 66  BILITOT 0.3 0.5 0.2*  PROT 7.5 7.1 6.5  ALBUMIN 3.7 2.9* 2.5*   No results for input(s): LIPASE, AMYLASE in the last 8760 hours. No results for input(s): AMMONIA in the last 8760 hours. CBC:  Recent Labs  03/28/15 1016  05/17/15 1522 11/07/15 1351  11/08/15 0648 11/09/15 0347  WBC 7.6  < > 8.5 8.0 5.1 6.2  NEUTROABS 5.6  --  5.9 7.0  --   --   HGB 11.9  < > 12.8 9.7* 10.3* 10.5*  HCT 36.7  < > 39.0 29.5*  33.0* 34.2*  MCV 92  < > 88.9 87.8 90.7 92.9  PLT 197  < > 278.0 206 225 230  < > = values in this interval not displayed. Cardiac Enzymes: No results for input(s): CKTOTAL, CKMB, CKMBINDEX, TROPONINI in the last 8760 hours. BNP: Invalid input(s): POCBNP CBG: No results for input(s): GLUCAP in the last 8760 hours.  Radiological Exams: Dg Chest 2 View  11/07/2015  CLINICAL DATA:  Shortness breath, productive cough 4 days EXAM: CHEST  2 VIEW COMPARISON:  04/27/2015 FINDINGS: The lungs are hyperinflated likely secondary to COPD. There is chronic elevation of the right diaphragm. There is a small left pleural effusion. There is hazy left lower lobe airspace disease which may reflect atelectasis versus pneumonia. There is no right pleural effusion. The heart and mediastinal contours are unremarkable. There is a large hiatal hernia. The osseous structures are unremarkable. IMPRESSION: There is mild hazy left lower lobe airspace disease concerning for pneumonia versus atelectasis. Electronically Signed   By: Kathreen Devoid   On: 11/07/2015 14:17    Assessment/Plan  Physical deconditioning Will have her work with physical therapy and occupational therapy team to help with gait training and muscle strengthening exercises.fall precautions. Skin care. Encourage to be out of bed.   Acute respiratory failure Has copd and was on o2 at night at home 2l/min. Currently was tachycardic, tachypnic and was using her accessory muscles to breath and her o2 sat had dropped to 86% on room air. Gave xanax 1 mg po x 1 and increased o2 to 4l/min. Gave one round of albuterol nebulizer. Her heart rate is now in 90s and breathing at 18-20/min, not using her accessory muscle and feels better with her breathing. o2 decreased to 3l/min and maintaining o2 sat >90% at rest. Monitor clinically for signs of worsening.  Copd Refusing her tapering dose of prednisone 30 mg daily for now. Understands the risk of not taking this and  is willing to take 10 mg daily x 1 week. Change prednisone to 10 mg daily x 1 week and stop. Start duoneb nebulizer qid x 1 week, then prn. Continue symbicort and spiriva. Wean o2 to 2l/min at tolerated to keep o2 sat > 885 with exertion and > 90% with rest  CAP Continue augmentin but extend it to 11/20/15 for now. Continue duoneb as above and her breathing treatment for now. Get Chest xray PA/ lateral to assess further. Continue florastor for now  Anxiety disorder Xanax 1 mg x 1 given at present which has been helpful. Start xanax 0.5 mg tid x 1 week and monitor  Protein calorie malnutrition Encourage po intake. Get dietary consult and start procel 1 scoop bid with meals for now. Monitor weekly weight  HTN Stable, continue norvasc and ramipril, check bp q shift x 1 week, check bmp  gerd Continue prilosec  Anemia of chronic disease Monitor cbc   Goals of care: short term rehabilitation   Labs/tests ordered: cbc with diff, cmp next lab  Family/ staff Communication: reviewed care plan with patient, her sister and nursing supervisor    Blanchie Serve, MD Internal [facility] [address] [place], [place] [address] Cell Phone (Monday-Friday 8 am -  5 pm): [phone] On Call: [phone] and follow prompts after 5 pm and on weekends Office Phone: [phone] Office Fax: [phone]

## 2015-11-15 DIAGNOSIS — D508 Other iron deficiency anemias: Secondary | ICD-10-CM | POA: Diagnosis not present

## 2015-11-15 LAB — HEPATIC FUNCTION PANEL
ALT: 32 U/L (ref 7–35)
AST: 34 U/L (ref 13–35)
Alkaline Phosphatase: 58 U/L (ref 25–125)
Bilirubin, Total: 0.3 mg/dL

## 2015-11-15 LAB — BASIC METABOLIC PANEL
BUN: 13 mg/dL (ref 4–21)
CREATININE: 0.5 mg/dL (ref 0.5–1.1)
Glucose: 79 mg/dL
POTASSIUM: 4 mmol/L (ref 3.4–5.3)
Sodium: 145 mmol/L (ref 137–147)

## 2015-11-15 LAB — CBC AND DIFFERENTIAL
HCT: 32 % — AB (ref 36–46)
Hemoglobin: 10.1 g/dL — AB (ref 12.0–16.0)
Platelets: 360 10*3/uL (ref 150–399)
WBC: 8.6 10*3/mL

## 2015-11-16 ENCOUNTER — Ambulatory Visit: Payer: Medicare Other | Admitting: Internal Medicine

## 2015-11-16 DIAGNOSIS — E785 Hyperlipidemia, unspecified: Secondary | ICD-10-CM | POA: Diagnosis not present

## 2015-11-16 LAB — HEPATIC FUNCTION PANEL
ALK PHOS: 64 U/L (ref 25–125)
ALT: 34 U/L (ref 7–35)
AST: 30 U/L (ref 13–35)
BILIRUBIN DIRECT: 0.05 mg/dL (ref 0.01–0.4)
BILIRUBIN, TOTAL: 0.3 mg/dL

## 2015-11-16 LAB — LIPID PANEL
CHOLESTEROL: 117 mg/dL (ref 0–200)
HDL: 45 mg/dL (ref 35–70)
LDL Cholesterol: 43 mg/dL
Triglycerides: 144 mg/dL (ref 40–160)

## 2015-11-20 DIAGNOSIS — D508 Other iron deficiency anemias: Secondary | ICD-10-CM | POA: Diagnosis not present

## 2015-11-20 LAB — BASIC METABOLIC PANEL
BUN: 14 mg/dL (ref 4–21)
CREATININE: 0.5 mg/dL (ref 0.5–1.1)
Glucose: 74 mg/dL
Potassium: 4.5 mmol/L (ref 3.4–5.3)
Sodium: 143 mmol/L (ref 137–147)

## 2015-11-30 ENCOUNTER — Ambulatory Visit (INDEPENDENT_AMBULATORY_CARE_PROVIDER_SITE_OTHER): Payer: Medicare Other | Admitting: Emergency Medicine

## 2015-11-30 ENCOUNTER — Encounter: Payer: Self-pay | Admitting: Emergency Medicine

## 2015-11-30 VITALS — BP 110/70 | HR 66 | Ht 65.5 in | Wt 127.0 lb

## 2015-11-30 DIAGNOSIS — J438 Other emphysema: Secondary | ICD-10-CM | POA: Diagnosis not present

## 2015-11-30 NOTE — Patient Instructions (Signed)
Please continue your symbicort and spiriva  We will arrange for you to have albuterol nebulizers to use as needed for shortness of breath.  Continue your oxygen at 2.5L/min.  Continue your physical therapy.  Follow with Dr Lamonte Sakai in 3 months or sooner if you have any problems.

## 2015-11-30 NOTE — Progress Notes (Signed)
   Subjective:    Patient ID: [name], female    DOB: 1930-06-06, 82 y.o.   MRN: [id number]  HPI 82 year old woman, former smoker (60 pack years), with a history of COPD, also breast cancer, hypertension, GERD. She has been followed by Dr Gwenette Greet and is on Advair and Spiriva. Her dyspnea has been somewhat worse with the hot humid weather. She has a daily cough prod of white. She uses albuterol rarely. She does have some exertional wheeze. She sleeps with oxygen and with exertion. No flare ups since last time. She did suffer a fall last week to cause her to have some right flank and right hip bruising, no rib fx  Acute OV 06/14/15 -- history of COPD and associated hypoxemia. She presents for acute visit today after she began to have more sputum, dark sputum witthout wheezing. No new fever, congestion, rhinorrhea.  We started her on doxy 2 days ago, already doing better.   ROV 08/30/15 -- History of COPD, hypoxemic respiratory failure. At her last visit she was treated for and acute bronchitis with doxycycline and prednisone.  She feels better since that time.  She has dyspnea when she eats - hard taking deep breath. Some dyspnea w exertion. She has some cough, white clear mucous. No wheeze.   ROV 11/30/15 -- follow-up visit for history of COPD, chronic hypoxemia, and frequent episodes of flaring chronic bronchitis. She was last seen in mid February in our office at which time she was diagnosed with acute exacerbation, possible pneumonia requiring hospitalization.showed left lower lobe infiltrate and her pneumococcal antigen was positive. She was discharged on 2/17 to complete a full course of antibiotics and a prednisone taper. She was started back on her Symbicort and Spiriva. She was d/c to SNF. She coughs most days, clears mucous. Occasional wheeze. No albuterol use. No flutter.    Review of Systems As per HPI      Objective:   Physical Exam Filed Vitals:   11/30/15 0902  BP: 110/70    Pulse: 66  Height: 5' 5.5" (1.664 m)  Weight: 127 lb (57.607 kg)  SpO2: 98%   Gen: Pleasant, in no distress,  normal affect  ENT: No lesions,  mouth clear,  oropharynx clear, no postnasal drip  Neck: No JVD, no TMG, no carotid bruits  Lungs: No use of accessory muscles, distant, few insp squeaks, exp wheeze.   Cardiovascular: RRR, heart sounds normal, no murmur or gallops, trace ankle edema bilaterally  Musculoskeletal: No deformities, no cyanosis or clubbing  Neuro: alert, non focal  Skin: Warm, no lesions or rashes      Assessment & Plan:  COPD (chronic obstructive pulmonary disease) with emphysema ([facility]) With recent exacerbation and left lower lobe pneumonia. She is improved. She has been discharged to skilled nursing facility. She is back on her Symbicort and Spiriva. I will order albuterol nebulizers when necessary for her to request.

## 2015-11-30 NOTE — Assessment & Plan Note (Signed)
With recent exacerbation and left lower lobe pneumonia. She is improved. She has been discharged to skilled nursing facility. She is back on her Symbicort and Spiriva. I will order albuterol nebulizers when necessary for her to request.

## 2015-12-19 ENCOUNTER — Non-Acute Institutional Stay (SKILLED_NURSING_FACILITY): Payer: Medicare Other | Admitting: Family

## 2015-12-19 DIAGNOSIS — R600 Localized edema: Secondary | ICD-10-CM

## 2015-12-19 DIAGNOSIS — F419 Anxiety disorder, unspecified: Secondary | ICD-10-CM | POA: Diagnosis not present

## 2015-12-19 DIAGNOSIS — J441 Chronic obstructive pulmonary disease with (acute) exacerbation: Secondary | ICD-10-CM | POA: Diagnosis not present

## 2015-12-19 DIAGNOSIS — I10 Essential (primary) hypertension: Secondary | ICD-10-CM

## 2015-12-19 DIAGNOSIS — K219 Gastro-esophageal reflux disease without esophagitis: Secondary | ICD-10-CM

## 2015-12-19 NOTE — Progress Notes (Signed)
Patient ID: [name], female   DOB: October 14, 1929, 82 y.o.   MRN: [id number]  Location:  [place] of Service:  SNF [phone]) Provider: Blanchie Serve, MD   No primary care provider on file.  No care team member to display  Extended Emergency Contact Information Primary Emergency Contact: McDaniel,Cathy Address: [address]          [place], [place] [address] [place] Phone: [phone] Mobile Phone: [phone] Relation: Sister Secondary Emergency Contact: Sheilah Pigeon [place] of [place] Phone: [phone] Relation: Son  Code Status: Full Code  Goals of care: Advanced Directive information Advanced Directives 11/07/2015  Does patient have an advance directive? Yes  Type of Paramedic of [place];Living will  Does patient want to make changes to advanced directive? No - Patient declined  Copy of advanced directive(s) in chart? No - copy requested     Chief Complaint  Patient presents with  . Medical Management of Chronic Issues    HPI:  Pt is a 82 y.o. female seen today at [facility] for medical management of chronic diseases. She is here for STR post hospital admission from 11/07/2015-11/10/2015 with acute respiratory failure with hypoxia in setting of COPD exacerbation and CAP. She has a medical history of HTN, Depression, Anxiety, GERD, COPD among others. She is seen in her room today with family members at the bedside. She denies any acute issues this visit states breathing much better. She continues to require continuous oxygen via nasal cannula. She denies any fever, chills, shortness of breath or wheezing. Facility staff reports no new concerns.    Past Medical History  Diagnosis Date  . Other emphysema ([place])   . Unspecified essential hypertension   . Osteoporosis, unspecified   . Other and unspecified hyperlipidemia   . Esophageal reflux   . Unspecified  asthma(493.90)   . Cancer ([place])   . Arthritis   . Emphysema of lung ([place])   . PONV (postoperative nausea and vomiting)   . On home oxygen therapy     at night  . Full dentures   . Wears glasses   . History of radiation therapy 07/01/06-08/13/06    left breast  total dose 6100 cGy  . Breast cancer ([place]) 04/11/06 bx    left breast invasive cductal ca  . 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 Anxiety   . Depression   . Emphysema   . Cataract     extraction  . Allergy   . Hx of radiation therapy 08/31/12-09/28/12    right breast    Past Surgical History  Procedure Laterality Date  . Partial hysterectomy    . Cataract extraction    . Hip surgery  1993    left  . Breast surgery    . Cyst on face  6 months ago  . Mass excision  02/18/2012    Procedure: MINOR EXCISION OF MASS;  Surgeon: Adin Hector, MD;  Location: [place];  Service: General;  Laterality: Left;  excise 2.5 cm. mass left chest wall  . Mastectomy  05/14/06    lt lumpectomy-node dissDr.Leone  . Breast lumpectomy  07/13/12    right -DCIS 7 o'clock stage Tis,Nx  . Abdominal hysterectomy      partial    Allergies  Allergen Reactions  . Neosporin [Neomycin-Polymyxin B  Gu] Itching and Rash  . Codeine Nausea Only  . Irbesartan Hives    Avapro  . Adhesive [Tape] Itching and Rash    To paper tape only. Rash and itching only where applied.      Medication List       This list is accurate as of: 12/19/15  2:20 PM.  Always use your most recent med list.               acetaminophen 325 MG tablet  Commonly known as:  TYLENOL  Take 2 tablets (650 mg total) by mouth every 6 (six) hours as needed for mild pain (or Fever >/= 101).     albuterol (2.5 MG/3ML) 0.083% nebulizer solution  Commonly known as:  PROVENTIL  Take 2.5 mg by nebulization every 4 (four) hours as needed for wheezing or shortness of breath.      albuterol 108 (90 Base) MCG/ACT inhaler  Commonly known as:  PROVENTIL HFA  Inhale 2 puffs into the lungs every 6 (six) hours as needed.     ALPRAZolam 0.5 MG tablet  Commonly known as:  XANAX  Take one tablet by mouth three times daily at 6am,2pm,and 10pm for anxiety. Hold for sedation     amLODipine 5 MG tablet  Commonly known as:  NORVASC  TAKE 1 TABLET (5 MG TOTAL) BY MOUTH DAILY.     aspirin 81 MG chewable tablet  Chew 81 mg by mouth daily.     atorvastatin 10 MG tablet  Commonly known as:  LIPITOR  TAKE 1 TABLET (10 MG TOTAL) BY MOUTH DAILY.     budesonide-formoterol 160-4.5 MCG/ACT inhaler  Commonly known as:  SYMBICORT  Inhale 2 puffs into the lungs 2 (two) times daily.     docusate sodium 100 MG capsule  Commonly known as:  COLACE  Take 1 capsule (100 mg total) by mouth 2 (two) times daily.     HYDROcodone-homatropine 5-1.5 MG/5ML syrup  Commonly known as:  HYCODAN  Take 5 mLs by mouth every 6 (six) hours as needed for cough.     omeprazole 20 MG capsule  Commonly known as:  PRILOSEC  Take 1 capsule (20 mg total) by mouth daily.     ondansetron 4 MG tablet  Commonly known as:  ZOFRAN  Take 1 tablet (4 mg total) by mouth every 6 (six) hours as needed for nausea.     OXYGEN  Inhale 2 L into the lungs as needed (with exertion to maintain SaO2 at >/ 88%).     ramipril 10 MG capsule  Commonly known as:  ALTACE  TAKE 1 CAPSULE (10 MG TOTAL) BY MOUTH DAILY.     tiotropium 18 MCG inhalation capsule  Commonly known as:  SPIRIVA HANDIHALER  PLACE 1 CAPSULE (18 MCG TOTAL) INTO INHALER AND INHALE DAILY.     Vitamin D 2000 units tablet  Take 2,000 Units by mouth daily.        Review of Systems  Constitutional: Negative for fever, chills, activity change, appetite change and fatigue.  HENT: Negative for congestion, rhinorrhea, sinus pressure, sneezing and sore throat.   Eyes: Negative.   Respiratory: Negative for cough, chest tightness, shortness of breath and  wheezing.   Cardiovascular: Positive for leg swelling. Negative for chest pain and palpitations.  Gastrointestinal: Negative.   Endocrine: Negative.   Genitourinary: Negative.   Musculoskeletal: Positive for gait problem.  Skin: Negative.   Neurological: Negative.   Psychiatric/Behavioral: Negative.     Immunization History  Administered Date(s) Administered  . Influenza Split 07/15/2011, 06/03/2012  . Influenza Whole 11/21/2005, 06/24/2007, 06/15/2008, 07/12/2009, 06/13/2010  . Influenza,inj,Quad PF,36+ Mos 06/23/2013, 06/16/2014, 07/25/2015  . Influenza-Unspecified 11/10/2015  . Pneumococcal Conjugate-13 09/10/2013  . Pneumococcal Polysaccharide-23 09/24/2003  . Pneumococcal-Unspecified 11/10/2015  . Td 09/23/2002   Pertinent  Health Maintenance Due  Topic Date Due  . DEXA SCAN  06/18/1997  . INFLUENZA VACCINE  04/23/2016  . PNA vac Low Risk Adult  Completed   Fall Risk  05/17/2015 03/28/2015 07/26/2014 06/16/2014 11/23/2013  Falls in the past year? Yes No No No No  Number falls in past yr: 1 - - - -  Injury with Fall? Yes - - - -  Risk for fall due to : - - - - Other (Comment)   Functional Status Survey:    Filed Vitals:   12/19/15 1358  BP: 143/67  Pulse: 81  Temp: 97.1 F (36.2 C)  Resp: 18  Height: 5\' 5"  (1.651 m)  Weight: 123 lb 3.2 oz (55.883 kg)  SpO2: 95%   Body mass index is 20.5 kg/(m^2). Physical Exam  Constitutional: She appears well-developed and well-nourished. No distress.  HENT:  Head: Normocephalic.  Right Ear: External ear normal.  Left Ear: External ear normal.  Mouth/Throat: Oropharynx is clear and moist.  Eyes: Conjunctivae and EOM are normal. Pupils are equal, round, and reactive to light. Right eye exhibits no discharge. Left eye exhibits no discharge. No scleral icterus.  Neck: Normal range of motion. No JVD present. No thyromegaly present.  Cardiovascular: Normal rate, regular rhythm, normal heart sounds and intact distal pulses.  Exam  reveals no gallop and no friction rub.   No murmur heard. Pulmonary/Chest: Effort normal and breath sounds normal. No respiratory distress. She has no wheezes. She has no rales.  Abdominal: Soft. Bowel sounds are normal. She exhibits no distension and no mass. There is no tenderness. There is no rebound and no guarding.  Musculoskeletal: Normal range of motion. She exhibits no tenderness.  Bilateral Lower extremities + 2 edema   Lymphadenopathy:    She has no cervical adenopathy.  Neurological: She is alert.  Skin: Skin is warm and dry. No rash noted. No erythema. No pallor.  Psychiatric: She has a normal mood and affect.    Labs reviewed:  Recent Labs  11/07/15 1351 11/08/15 0648 11/09/15 0347  NA 137 142 144  K 3.5 3.8 3.9  CL 99* 107 106  CO2 28 27 32  GLUCOSE 127* 154* 154*  BUN 35* 20 16  CREATININE 1.08* 0.67 0.52  CALCIUM 8.7* 8.7* 8.6*    Recent Labs  05/17/15 1522 11/07/15 1351 11/08/15 0648  AST 15 30 27   ALT 10 16 15   ALKPHOS 130* 69 66  BILITOT 0.3 0.5 0.2*  PROT 7.5 7.1 6.5  ALBUMIN 3.7 2.9* 2.5*    Recent Labs  03/28/15 1016  05/17/15 1522 11/07/15 1351 11/08/15 0648 11/09/15 0347  WBC 7.6  < > 8.5 8.0 5.1 6.2  NEUTROABS 5.6  --  5.9 7.0  --   --   HGB 11.9  < > 12.8 9.7* 10.3* 10.5*  HCT 36.7  < > 39.0 29.5* 33.0* 34.2*  MCV 92  < > 88.9 87.8 90.7 92.9  PLT 197  < > 278.0 206 225 230  < > = values in this interval not displayed. Lab Results  Component Value Date   TSH 1.37 05/17/2015   Lab Results  Component Value Date   HGBA1C  5.9 05/17/2015   Lab Results  Component Value Date   CHOL 143 05/17/2015   HDL 50.40 05/17/2015   LDLCALC 66 05/17/2015   TRIG 131.0 05/17/2015   CHOLHDL 3 05/17/2015    Significant Diagnostic Results in last 30 days:  No results found.  Assessment/Plan 1. COPD exacerbation (HCC) Afebrile. Reports much improvement. Lungs CTA. Continue on oxygen 2 liters [place]. Continue on Symbicort, Spiriva and  Albuterol.   2. Gastroesophageal reflux disease without esophagitis Asymptomatic. Continue omeprazole 20 mg Capsule.   3. Essential hypertension B/p within range for age. Continue Amlodipine and Ramipril. Monitor BMP  4. Anxiety Stable on Xanax. Continue to monitor.   5. Localized edema Bilateral LE's +2 pitting edema. Encourage to elevate legs while seated. Ordering Bilateral knee high Ted hose on in the morning and off at bed. Continue to monitor.     Family/ staff Communication: Reviewed plan of care with patient's family members, patient and facility Nurse.   Labs/tests ordered:None

## 2016-01-15 ENCOUNTER — Encounter: Payer: Self-pay | Admitting: Family

## 2016-01-15 ENCOUNTER — Non-Acute Institutional Stay (SKILLED_NURSING_FACILITY): Payer: Medicare Other | Admitting: Family

## 2016-01-15 DIAGNOSIS — F419 Anxiety disorder, unspecified: Secondary | ICD-10-CM | POA: Diagnosis not present

## 2016-01-15 DIAGNOSIS — E785 Hyperlipidemia, unspecified: Secondary | ICD-10-CM | POA: Diagnosis not present

## 2016-01-15 DIAGNOSIS — J441 Chronic obstructive pulmonary disease with (acute) exacerbation: Secondary | ICD-10-CM

## 2016-01-15 DIAGNOSIS — M1990 Unspecified osteoarthritis, unspecified site: Secondary | ICD-10-CM

## 2016-01-15 DIAGNOSIS — I10 Essential (primary) hypertension: Secondary | ICD-10-CM | POA: Diagnosis not present

## 2016-01-15 NOTE — Progress Notes (Signed)
Location:  [place] Room Number: [place] of Service:  SNF (31) Provider:  Marlowe Sax, FNP-C Blanchie Serve, MD   No care team member to display  Extended Emergency Contact Information Primary Emergency Contact: McDaniel,Cathy Address: [address]          [place], [place] [address] [place] Phone: [phone] Mobile Phone: [phone] Relation: Sister Secondary Emergency Contact: Sheilah Pigeon [place] of [place] Phone: [phone] Relation: Son  Code Status: Full Code  Goals of care: Advanced Directive information Advanced Directives 11/07/2015  Does patient have an advance directive? Yes  Type of Paramedic of [place];Living will  Does patient want to make changes to advanced directive? No - Patient declined  Copy of advanced directive(s) in chart? No - copy requested     Chief Complaint  Patient presents with  . Medical Management of Chronic Issues    Routine Visit    HPI:  Pt is a 82 y.o. female seen today for medical management of chronic diseases. She has a medical history of HTN, GERD, COPD, Depression, Anxiety among others. She is seen in her room today. She denies any acute issues this visit. She states lower extremities edema has improved. Continues to exercise with PT/OT and self propels on facility Hallways. Facility staff reports no new concerns.    Past Medical History  Diagnosis Date  . Other emphysema ([place])   . Unspecified essential hypertension   . Osteoporosis, unspecified   . Other and unspecified hyperlipidemia   . Esophageal reflux   . Unspecified asthma(493.90)   . Cancer ([place])   . Arthritis   . Emphysema of lung ([place])   . PONV (postoperative nausea and vomiting)   . On home oxygen therapy     at night  . Full dentures   . Wears glasses   . History of radiation therapy 07/01/06-08/13/06    left breast  total dose 6100 cGy  . Breast  cancer ([place]) 04/11/06 bx    left breast invasive cductal ca  . 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 Anxiety   . Depression   . Emphysema   . Cataract     extraction  . Allergy   . Hx of radiation therapy 08/31/12-09/28/12    right breast    Past Surgical History  Procedure Laterality Date  . Partial hysterectomy    . Cataract extraction    . Hip surgery  1993    left  . Breast surgery    . Cyst on face  6 months ago  . Mass excision  02/18/2012    Procedure: MINOR EXCISION OF MASS;  Surgeon: Adin Hector, MD;  Location: [facility];  Service: General;  Laterality: Left;  excise 2.5 cm. mass left chest wall  . Mastectomy  05/14/06    lt lumpectomy-node dissDr.Leone  . Breast lumpectomy  07/13/12    right -DCIS 7 o'clock stage Tis,Nx  . Abdominal hysterectomy      partial    Allergies  Allergen Reactions  . Neosporin [Neomycin-Polymyxin B Gu] Itching and Rash  . Codeine Nausea Only  . Irbesartan Hives    Avapro  . Adhesive [Tape] Itching and Rash    To paper tape only. Rash and itching only where applied.      Medication List       This  list is accurate as of: 01/15/16  4:41 PM.  Always use your most recent med list.               acetaminophen 325 MG tablet  Commonly known as:  TYLENOL  Take 2 tablets (650 mg total) by mouth every 6 (six) hours as needed for mild pain (or Fever >/= 101).     albuterol (2.5 MG/3ML) 0.083% nebulizer solution  Commonly known as:  PROVENTIL  Take 2.5 mg by nebulization every 4 (four) hours as needed for wheezing or shortness of breath.     albuterol 108 (90 Base) MCG/ACT inhaler  Commonly known as:  PROVENTIL HFA  Inhale 2 puffs into the lungs every 6 (six) hours as needed.     ALPRAZolam 0.5 MG tablet  Commonly known as:  XANAX  Take 1 tablet 3 times a daily 6am - 2pm, 10pm ( please hold for sedation). Then take 1 tablet by mouth 2  times daily as needed for anxiety.     amLODipine 5 MG tablet  Commonly known as:  NORVASC  TAKE 1 TABLET (5 MG TOTAL) BY MOUTH DAILY.     aspirin 81 MG chewable tablet  Chew 81 mg by mouth daily.     atorvastatin 10 MG tablet  Commonly known as:  LIPITOR  TAKE 1 TABLET (10 MG TOTAL) BY MOUTH DAILY.     budesonide-formoterol 160-4.5 MCG/ACT inhaler  Commonly known as:  SYMBICORT  Inhale 2 puffs into the lungs 2 (two) times daily.     docusate sodium 100 MG capsule  Commonly known as:  COLACE  Take 100 mg by mouth daily.     HYDROcodone-homatropine 5-1.5 MG/5ML syrup  Commonly known as:  HYCODAN  Take 5 mLs by mouth every 6 (six) hours as needed for cough.     ipratropium-albuterol 0.5-2.5 (3) MG/3ML Soln  Commonly known as:  DUONEB  Take 3 mLs by nebulization every 6 (six) hours as needed.     omeprazole 20 MG capsule  Commonly known as:  PRILOSEC  Take 1 capsule (20 mg total) by mouth daily.     ondansetron 4 MG tablet  Commonly known as:  ZOFRAN  Take 1 tablet (4 mg total) by mouth every 6 (six) hours as needed for nausea.     OXYGEN  Inhale 2 L into the lungs as needed (with exertion to maintain SaO2 at >/ 88%).     ramipril 10 MG capsule  Commonly known as:  ALTACE  TAKE 1 CAPSULE (10 MG TOTAL) BY MOUTH DAILY.     tiotropium 18 MCG inhalation capsule  Commonly known as:  SPIRIVA HANDIHALER  PLACE 1 CAPSULE (18 MCG TOTAL) INTO INHALER AND INHALE DAILY.     UNABLE TO FIND  Med Name: House Shake  With lunch and dinner per resident request     Vitamin D3 2000 units Tabs  Take 1 tablet by mouth daily at 2 PM.        Review of Systems  Constitutional: Negative for fever, chills, activity change, appetite change and fatigue.  HENT: Negative for congestion, rhinorrhea, sinus pressure, sneezing and sore throat.   Eyes: Negative.   Respiratory: Negative for cough, chest tightness, shortness of breath and wheezing.   Cardiovascular: Negative for chest pain,  palpitations and leg swelling.  Gastrointestinal: Negative.   Endocrine: Negative.   Genitourinary: Negative.   Musculoskeletal: Positive for gait problem.  Skin: Negative.   Neurological: Negative.   Psychiatric/Behavioral: Negative.  Immunization History  Administered Date(s) Administered  . Influenza Split 07/15/2011, 06/03/2012  . Influenza Whole 11/21/2005, 06/24/2007, 06/15/2008, 07/12/2009, 06/13/2010  . Influenza,inj,Quad PF,36+ Mos 06/23/2013, 06/16/2014, 07/25/2015  . Influenza-Unspecified 11/10/2015  . PPD Test 11/14/2015, 11/30/2015  . Pneumococcal Conjugate-13 09/10/2013  . Pneumococcal Polysaccharide-23 09/24/2003  . Pneumococcal-Unspecified 11/10/2015  . Td 09/23/2002   Pertinent  Health Maintenance Due  Topic Date Due  . DEXA SCAN  09/23/2016 (Originally 06/18/1997)  . INFLUENZA VACCINE  04/23/2016  . PNA vac Low Risk Adult  Completed   Fall Risk  05/17/2015 03/28/2015 07/26/2014 06/16/2014 11/23/2013  Falls in the past year? Yes No No No No  Number falls in past yr: 1 - - - -  Injury with Fall? Yes - - - -  Risk for fall due to : - - - - Other (Comment)   Functional Status Survey:    Filed Vitals:   01/15/16 1601  BP: 113/64  Pulse: 77  Temp: 97.5 F (36.4 C)  Resp: 20  Height: 5\' 5"  (1.651 m)  Weight: 130 lb 6.4 oz (59.149 kg)  SpO2: 96%   Body mass index is 21.7 kg/(m^2). Physical Exam  Constitutional: She appears well-developed and well-nourished. No distress.  HENT:  Head: Normocephalic.  Right Ear: External ear normal.  Left Ear: External ear normal.  Mouth/Throat: Oropharynx is clear and moist.  Eyes: Conjunctivae and EOM are normal. Pupils are equal, round, and reactive to light. Right eye exhibits no discharge. Left eye exhibits no discharge. No scleral icterus.  Neck: Normal range of motion. No JVD present. No thyromegaly present.  Cardiovascular: Normal rate, regular rhythm, normal heart sounds and intact distal pulses.  Exam reveals no  gallop and no friction rub.   No murmur heard. Pulmonary/Chest: Effort normal and breath sounds normal. No respiratory distress. She has no wheezes. She has no rales.  Continuous oxygen 2 liters nasal cannula in place.   Abdominal: Soft. Bowel sounds are normal. She exhibits no distension and no mass. There is no tenderness. There is no rebound and no guarding.  Musculoskeletal: Normal range of motion. She exhibits no tenderness.  Arthritic changes on fingers. Self propels on wheelchair   Lymphadenopathy:    She has no cervical adenopathy.  Neurological: She is alert.  Skin: Skin is warm and dry. No rash noted. No erythema. No pallor.  Psychiatric: She has a normal mood and affect.    Labs reviewed:  Recent Labs  11/07/15 1351 11/08/15 0648 11/09/15 0347 11/15/15 11/20/15  NA 137 142 144 145 143  K 3.5 3.8 3.9 4.0 4.5  CL 99* 107 106  --   --   CO2 28 27 32  --   --   GLUCOSE 127* 154* 154*  --   --   BUN 35* 20 16 13 14   CREATININE 1.08* 0.67 0.52 0.5 0.5  CALCIUM 8.7* 8.7* 8.6*  --   --     Recent Labs  05/17/15 1522 11/07/15 1351 11/08/15 0648 11/15/15 11/16/15  AST 15 30 27  34 30  ALT 10 16 15  32 34  ALKPHOS 130* 69 66 58 64  BILITOT 0.3 0.5 0.2*  --   --   PROT 7.5 7.1 6.5  --   --   ALBUMIN 3.7 2.9* 2.5*  --   --     Recent Labs  03/28/15 1016  05/17/15 1522 11/07/15 1351 11/08/15 0648 11/09/15 0347 11/15/15  WBC 7.6  < > 8.5 8.0 5.1 6.2 8.6  NEUTROABS 5.6  --  5.9 7.0  --   --   --   HGB 11.9  < > 12.8 9.7* 10.3* 10.5* 10.1*  HCT 36.7  < > 39.0 29.5* 33.0* 34.2* 32*  MCV 92  < > 88.9 87.8 90.7 92.9  --   PLT 197  < > 278.0 206 225 230 360  < > = values in this interval not displayed. Lab Results  Component Value Date   TSH 1.37 05/17/2015   Lab Results  Component Value Date   HGBA1C 5.9 05/17/2015   Lab Results  Component Value Date   CHOL 117 11/16/2015   HDL 45 11/16/2015   LDLCALC 43 11/16/2015   TRIG 144 11/16/2015   CHOLHDL 3  05/17/2015    Significant Diagnostic Results in last 30 days:  No results found.  Assessment/Plan  HTN B/p stable. Continue on amlodipine and Ramipril. Monitor BMP  COPD Stable. Exam findings negative. Continue on Albuterol inhaler and Duonebs.Oxygen 2 liters via   Hyperlipidemia Continue on Atorvastatin 10 mg Tablet  Arthrits Continue on Acetaminophen PRN and Hycodan.   Family/ staff Communication: Reviewed plan of care with patient and facility Nurse supervisor.   Labs/tests ordered:  None

## 2016-01-29 ENCOUNTER — Other Ambulatory Visit: Payer: Medicare Other

## 2016-01-29 ENCOUNTER — Ambulatory Visit: Payer: Medicare Other | Admitting: Hematology & Oncology

## 2016-02-07 ENCOUNTER — Encounter: Payer: Self-pay | Admitting: Family

## 2016-02-07 ENCOUNTER — Non-Acute Institutional Stay (SKILLED_NURSING_FACILITY): Payer: Medicare Other | Admitting: Family

## 2016-02-07 DIAGNOSIS — K219 Gastro-esophageal reflux disease without esophagitis: Secondary | ICD-10-CM | POA: Diagnosis not present

## 2016-02-07 DIAGNOSIS — J439 Emphysema, unspecified: Secondary | ICD-10-CM | POA: Diagnosis not present

## 2016-02-07 DIAGNOSIS — F329 Major depressive disorder, single episode, unspecified: Secondary | ICD-10-CM

## 2016-02-07 DIAGNOSIS — E785 Hyperlipidemia, unspecified: Secondary | ICD-10-CM | POA: Diagnosis not present

## 2016-02-07 DIAGNOSIS — I10 Essential (primary) hypertension: Secondary | ICD-10-CM

## 2016-02-07 DIAGNOSIS — F32A Depression, unspecified: Secondary | ICD-10-CM

## 2016-02-07 DIAGNOSIS — F419 Anxiety disorder, unspecified: Secondary | ICD-10-CM

## 2016-02-11 NOTE — Progress Notes (Signed)
Patient ID: [name], female   DOB: 28-Aug-1930, 82 y.o.   MRN: [id number]  Location:  [facility] Room Number: 903-B Place of Service:  SNF (31) Provider: Mayley Lish FNP-C  Blanchie Serve, MD   Extended Emergency Contact Information Primary Emergency Contact: McDaniel,Cathy Address: [address]          [place], [place] [address] Johnnette Litter of [place] Phone: [phone] Mobile Phone: [phone] Relation: Sister Secondary Emergency Contact: Sheilah Pigeon [place] of [place] Phone: [phone] Relation: Son  Code Status: Full Code  Goals of care: Advanced Directive information Advanced Directives 11/07/2015  Does patient have an advance directive? Yes  Type of Paramedic of [place];Living will  Does patient want to make changes to advanced directive? No - Patient declined  Copy of advanced directive(s) in chart? No - copy requested     Chief Complaint  Patient presents with  . Medical Management of Chronic Issues    Routine Visit    HPI:  Pt is a 82 y.o. female seen today at [facility] for medical management of chronic diseases.She has a significant medical history of HTN, COPD, GERD, Hyperlipidemia, Depression and anxiety among others. She is seen in her room today. She denies any acute issues this visit. She continues to self propel on wheel chiar. She spend time reading in her room. Facility staff voices no concerns.      Past Medical History  Diagnosis Date  . Other emphysema ([place])   . Unspecified essential hypertension   . Osteoporosis, unspecified   . Other and unspecified hyperlipidemia   . Esophageal reflux   . Unspecified asthma(493.90)   . Cancer ([place])   . Arthritis   . Emphysema of lung ([place])   . PONV (postoperative nausea and vomiting)   . On home oxygen therapy     at night  . Full dentures   . Wears glasses   . History of radiation therapy  07/01/06-08/13/06    left breast  total dose 6100 cGy  . Breast cancer ([place]) 04/11/06 bx    left breast invasive cductal ca  . 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 Anxiety   . Depression   . Emphysema   . Cataract     extraction  . Allergy   . Hx of radiation therapy 08/31/12-09/28/12    right breast    Past Surgical History  Procedure Laterality Date  . Partial hysterectomy    . Cataract extraction    . Hip surgery  1993    left  . Breast surgery    . Cyst on face  6 months ago  . Mass excision  02/18/2012    Procedure: MINOR EXCISION OF MASS;  Surgeon: Adin Hector, MD;  Location: [place];  Service: General;  Laterality: Left;  excise 2.5 cm. mass left chest wall  . Mastectomy  05/14/06    lt lumpectomy-node dissDr.Leone  . Breast lumpectomy  07/13/12    right -DCIS 7 o'clock stage Tis,Nx  . Abdominal hysterectomy      partial    Allergies  Allergen Reactions  . Neosporin [Neomycin-Polymyxin B Gu] Itching and Rash  . Codeine Nausea Only  . Irbesartan Hives    Avapro  . Adhesive [Tape] Itching and Rash    To paper tape only. Rash and itching only where  applied.      Medication List       This list is accurate as of: 02/07/16 11:59 PM.  Always use your most recent med list.               acetaminophen 325 MG tablet  Commonly known as:  TYLENOL  Take 2 tablets (650 mg total) by mouth every 6 (six) hours as needed for mild pain (or Fever >/= 101).     albuterol (2.5 MG/3ML) 0.083% nebulizer solution  Commonly known as:  PROVENTIL  Take 2.5 mg by nebulization every 4 (four) hours as needed for wheezing or shortness of breath.     albuterol 108 (90 Base) MCG/ACT inhaler  Commonly known as:  PROVENTIL HFA  Inhale 2 puffs into the lungs every 6 (six) hours as needed.     ALPRAZolam 0.5 MG tablet  Commonly known as:  XANAX  Take 1 tablet 3 times a daily 6am - 2pm,  10pm ( please hold for sedation). Then take 1 tablet by mouth 2 times daily as needed for anxiety.     amLODipine 5 MG tablet  Commonly known as:  NORVASC  TAKE 1 TABLET (5 MG TOTAL) BY MOUTH DAILY.     aspirin 81 MG chewable tablet  Chew 81 mg by mouth daily.     atorvastatin 10 MG tablet  Commonly known as:  LIPITOR  TAKE 1 TABLET (10 MG TOTAL) BY MOUTH DAILY.     budesonide-formoterol 160-4.5 MCG/ACT inhaler  Commonly known as:  SYMBICORT  Inhale 2 puffs into the lungs 2 (two) times daily.     docusate sodium 100 MG capsule  Commonly known as:  COLACE  Take 100 mg by mouth daily.     HYDROcodone-homatropine 5-1.5 MG/5ML syrup  Commonly known as:  HYCODAN  Take 5 mLs by mouth every 6 (six) hours as needed for cough.     ipratropium-albuterol 0.5-2.5 (3) MG/3ML Soln  Commonly known as:  DUONEB  Take 3 mLs by nebulization every 6 (six) hours as needed.     omeprazole 20 MG capsule  Commonly known as:  PRILOSEC  Take 1 capsule (20 mg total) by mouth daily.     ondansetron 4 MG tablet  Commonly known as:  ZOFRAN  Take 1 tablet (4 mg total) by mouth every 6 (six) hours as needed for nausea.     OXYGEN  Inhale 2 L into the lungs as needed (with exertion to maintain SaO2 at >/ 88%).     ramipril 10 MG capsule  Commonly known as:  ALTACE  TAKE 1 CAPSULE (10 MG TOTAL) BY MOUTH DAILY.     tiotropium 18 MCG inhalation capsule  Commonly known as:  SPIRIVA HANDIHALER  PLACE 1 CAPSULE (18 MCG TOTAL) INTO INHALER AND INHALE DAILY.     UNABLE TO FIND  Med Name: House Shake  With lunch and dinner per resident request     Vitamin D3 2000 units Tabs  Take 1 tablet by mouth daily at 2 PM.        Review of Systems  Constitutional: Negative for fever, chills, activity change, appetite change and fatigue.  HENT: Negative for congestion, rhinorrhea, sinus pressure, sneezing and sore throat.   Eyes: Negative.   Respiratory: Negative for cough, chest tightness, shortness of  breath and wheezing.   Cardiovascular: Negative for chest pain, palpitations and leg swelling.  Gastrointestinal: Negative for nausea, vomiting, abdominal pain, diarrhea, constipation and abdominal distention.  Endocrine: Negative.  Genitourinary: Negative for dysuria, urgency, frequency and flank pain.  Musculoskeletal: Positive for gait problem.       Uses wheelchair   Skin: Negative.   Psychiatric/Behavioral: Negative for hallucinations, confusion, sleep disturbance and agitation. The patient is not hyperactive.     Immunization History  Administered Date(s) Administered  . Influenza Split 07/15/2011, 06/03/2012  . Influenza Whole 11/21/2005, 06/24/2007, 06/15/2008, 07/12/2009, 06/13/2010  . Influenza,inj,Quad PF,36+ Mos 06/23/2013, 06/16/2014, 07/25/2015  . Influenza-Unspecified 11/10/2015  . PPD Test 11/14/2015, 11/30/2015  . Pneumococcal Conjugate-13 09/10/2013  . Pneumococcal Polysaccharide-23 09/24/2003  . Pneumococcal-Unspecified 11/10/2015  . Td 09/23/2002   Pertinent  Health Maintenance Due  Topic Date Due  . DEXA SCAN  09/23/2016 (Originally 06/18/1997)  . INFLUENZA VACCINE  04/23/2016  . PNA vac Low Risk Adult  Completed   Fall Risk  05/17/2015 03/28/2015 07/26/2014 06/16/2014 11/23/2013  Falls in the past year? Yes No No No No  Number falls in past yr: 1 - - - -  Injury with Fall? Yes - - - -  Risk for fall due to : - - - - Other (Comment)   Functional Status Survey:    Filed Vitals:   02/07/16 1125  BP: 130/64  Pulse: 84  Temp: 97.6 F (36.4 C)  TempSrc: Oral  Resp: 18  Height: 5\' 5"  (1.651 m)  Weight: 132 lb 3.2 oz (59.966 kg)  SpO2: 98%   Body mass index is 22 kg/(m^2). Physical Exam  Constitutional: She is oriented to person, place, and time. She appears well-developed and well-nourished. No distress.  HENT:  Head: Normocephalic.  Eyes: Conjunctivae and EOM are normal. Pupils are equal, round, and reactive to light. Right eye exhibits no discharge.  Left eye exhibits no discharge. No scleral icterus.  Neck: Normal range of motion. Neck supple. No JVD present. No thyromegaly present.  Cardiovascular: Normal rate, regular rhythm and intact distal pulses.  Exam reveals no gallop and no friction rub.   No murmur heard. Pulmonary/Chest: Effort normal.  Abdominal: Soft. Bowel sounds are normal. She exhibits no distension. There is no tenderness. There is no rebound and no guarding.  Musculoskeletal: She exhibits no edema or tenderness.  Unsteady gait. Self propel on Wheelchair   Lymphadenopathy:    She has no cervical adenopathy.  Neurological: She is oriented to person, place, and time.  Skin: Skin is warm and dry. No rash noted. No erythema. No pallor.  Psychiatric: She has a normal mood and affect.    Labs reviewed:  Recent Labs  11/07/15 1351 11/08/15 0648 11/09/15 0347 11/15/15 11/20/15  NA 137 142 144 145 143  K 3.5 3.8 3.9 4.0 4.5  CL 99* 107 106  --   --   CO2 28 27 32  --   --   GLUCOSE 127* 154* 154*  --   --   BUN 35* 20 16 13 14   CREATININE 1.08* 0.67 0.52 0.5 0.5  CALCIUM 8.7* 8.7* 8.6*  --   --     Recent Labs  05/17/15 1522 11/07/15 1351 11/08/15 0648 11/15/15 11/16/15  AST 15 30 27  34 30  ALT 10 16 15  32 34  ALKPHOS 130* 69 66 58 64  BILITOT 0.3 0.5 0.2*  --   --   PROT 7.5 7.1 6.5  --   --   ALBUMIN 3.7 2.9* 2.5*  --   --     Recent Labs  03/28/15 1016  05/17/15 1522 11/07/15 1351 11/08/15 0648 11/09/15 0347 11/15/15  WBC 7.6  < > 8.5 8.0 5.1 6.2 8.6  NEUTROABS 5.6  --  5.9 7.0  --   --   --   HGB 11.9  < > 12.8 9.7* 10.3* 10.5* 10.1*  HCT 36.7  < > 39.0 29.5* 33.0* 34.2* 32*  MCV 92  < > 88.9 87.8 90.7 92.9  --   PLT 197  < > 278.0 206 225 230 360  < > = values in this interval not displayed. Lab Results  Component Value Date   TSH 1.37 05/17/2015   Lab Results  Component Value Date   HGBA1C 5.9 05/17/2015   Lab Results  Component Value Date   CHOL 117 11/16/2015   HDL 45  11/16/2015   LDLCALC 43 11/16/2015   TRIG 144 11/16/2015   CHOLHDL 3 05/17/2015    Significant Diagnostic Results in last 30 days:  No results found.  Assessment/Plan 1. Essential hypertension B/p stable. Continue Amlodipine 5 mg Tablet and  Ramipril 10 mg Tablet. Monitor BMP  2. Pulmonary emphysema, unspecified emphysema type ([place]) Stable.Continue Albuterol 0.0083 % nebulizer, Symbicort and Duoneb. Continue oxygen 2 liters via nasal cannula. Continue to monitor.    3. Gastroesophageal reflux disease without esophagitis Omeprazole 20 mg Capsule effective. Continue to monitor.   4. Depression Stable. Enjoys reading books in her room. Self propels on wheelchair on facility hallways. Family visit. Continue to monitor for mood changes.   5. Hyperlipidemia Continue Atorvastatin 10 mg Tablet. Continue to monitor lipid panel.   6. Anxiety Alprazolam 0.5 mg Tablet effective. Continue to monitor.     Family/ staff Communication:Reviewed plan of care with patient and facility Nurse supervisor.   Labs/tests ordered: None

## 2016-02-16 ENCOUNTER — Telehealth: Payer: Self-pay | Admitting: Emergency Medicine

## 2016-02-16 NOTE — Telephone Encounter (Signed)
Spoke with the pt  She states that she is in a nursing home now and unable to get POC through DME so her son is buying her one but she needs rx for this  I have written o2 order out on rx pad and left this up front for her sister to pick up for her  Nothing further needed

## 2016-03-04 ENCOUNTER — Non-Acute Institutional Stay (SKILLED_NURSING_FACILITY): Payer: Medicare Other | Admitting: Family

## 2016-03-04 DIAGNOSIS — I10 Essential (primary) hypertension: Secondary | ICD-10-CM

## 2016-03-04 DIAGNOSIS — K219 Gastro-esophageal reflux disease without esophagitis: Secondary | ICD-10-CM

## 2016-03-04 DIAGNOSIS — J439 Emphysema, unspecified: Secondary | ICD-10-CM

## 2016-03-04 DIAGNOSIS — E785 Hyperlipidemia, unspecified: Secondary | ICD-10-CM

## 2016-03-04 DIAGNOSIS — M1990 Unspecified osteoarthritis, unspecified site: Secondary | ICD-10-CM | POA: Diagnosis not present

## 2016-03-04 DIAGNOSIS — R269 Unspecified abnormalities of gait and mobility: Secondary | ICD-10-CM | POA: Insufficient documentation

## 2016-03-04 NOTE — Progress Notes (Signed)
Patient ID: [name], female   DOB: 07-Jan-1930, 82 y.o.   MRN: [id number]  Location:  [place] [facility] of Service:  SNF (31) Provider: Thresia Ramanathan FNP-C   Blanchie Serve, MD  Patient Care Team: Blanchie Serve, MD as PCP - General (Internal Medicine)  Extended Emergency Contact Information Primary Emergency Contact: McDaniel,Cathy Address: [address]          [place], [place] [address] [place] of [place] Phone: [phone] Mobile Phone: [phone] Relation: Sister Secondary Emergency Contact: Sheilah Pigeon [place] of [place] Phone: [phone] Relation: Son  Code Status: DNR  Goals of care: Advanced Directive information Advanced Directives 11/07/2015  Does patient have an advance directive? Yes  Type of Paramedic of [place];Living will  Does patient want to make changes to advanced directive? No - Patient declined  Copy of advanced directive(s) in chart? No - copy requested     Chief Complaint  Patient presents with  . Medical Management of Chronic Issues    HPI:  Pt is a 82 y.o. female seen today at [facility] for medical management of chronic diseases. She has a medical history of HTN, COPD, GERD, Hyperlipidemia, Arthritis among other condition.she is seen in her room today. She denies any acute issues. She continues to require continuous oxygen via nasal cannula but denies any shortness of breath. No new concerns from facility Nursing staff.       Past Medical History  Diagnosis Date  . Other emphysema ([place])   . Unspecified essential hypertension   . Osteoporosis, unspecified   . Other and unspecified hyperlipidemia   . Esophageal reflux   . Unspecified asthma(493.90)   . Cancer ([place])   . Arthritis   . Emphysema of lung ([place])   . PONV (postoperative nausea and vomiting)   . On home oxygen therapy     at night  . Full dentures   . Wears glasses   . History  of radiation therapy 07/01/06-08/13/06    left breast  total dose 6100 cGy  . Breast cancer ([place]) 04/11/06 bx    left breast invasive cductal ca  . 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 Anxiety   . Depression   . Emphysema   . Cataract     extraction  . Allergy   . Hx of radiation therapy 08/31/12-09/28/12    right breast    Past Surgical History  Procedure Laterality Date  . Partial hysterectomy    . Cataract extraction    . Hip surgery  1993    left  . Breast surgery    . Cyst on face  6 months ago  . Mass excision  02/18/2012    Procedure: MINOR EXCISION OF MASS;  Surgeon: Adin Hector, MD;  Location: [facility];  Service: General;  Laterality: Left;  excise 2.5 cm. mass left chest wall  . Mastectomy  05/14/06    lt lumpectomy-node dissDr.Leone  . Breast lumpectomy  07/13/12    right -DCIS 7 o'clock stage Tis,Nx  . Abdominal hysterectomy      partial    Allergies  Allergen Reactions  . Neosporin [Neomycin-Polymyxin B Gu] Itching and Rash  . Codeine Nausea Only  . Irbesartan Hives    Avapro  . Adhesive [Tape] Itching and Rash    To paper tape only. Rash and  itching only where applied.      Medication List       This list is accurate as of: 03/04/16  5:31 PM.  Always use your most recent med list.               acetaminophen 325 MG tablet  Commonly known as:  TYLENOL  Take 2 tablets (650 mg total) by mouth every 6 (six) hours as needed for mild pain (or Fever >/= 101).     albuterol (2.5 MG/3ML) 0.083% nebulizer solution  Commonly known as:  PROVENTIL  Take 2.5 mg by nebulization every 4 (four) hours as needed for wheezing or shortness of breath.     albuterol 108 (90 Base) MCG/ACT inhaler  Commonly known as:  PROVENTIL HFA  Inhale 2 puffs into the lungs every 6 (six) hours as needed.     ALPRAZolam 0.5 MG tablet  Commonly known as:  XANAX  Take 1 tablet 3  times a daily 6am - 2pm, 10pm ( please hold for sedation). Then take 1 tablet by mouth 2 times daily as needed for anxiety.     amLODipine 5 MG tablet  Commonly known as:  NORVASC  TAKE 1 TABLET (5 MG TOTAL) BY MOUTH DAILY.     aspirin 81 MG chewable tablet  Chew 81 mg by mouth daily.     atorvastatin 10 MG tablet  Commonly known as:  LIPITOR  TAKE 1 TABLET (10 MG TOTAL) BY MOUTH DAILY.     budesonide-formoterol 160-4.5 MCG/ACT inhaler  Commonly known as:  SYMBICORT  Inhale 2 puffs into the lungs 2 (two) times daily.     docusate sodium 100 MG capsule  Commonly known as:  COLACE  Take 100 mg by mouth daily.     HYDROcodone-homatropine 5-1.5 MG/5ML syrup  Commonly known as:  HYCODAN  Take 5 mLs by mouth every 6 (six) hours as needed for cough.     ipratropium-albuterol 0.5-2.5 (3) MG/3ML Soln  Commonly known as:  DUONEB  Take 3 mLs by nebulization every 6 (six) hours as needed.     omeprazole 20 MG capsule  Commonly known as:  PRILOSEC  Take 1 capsule (20 mg total) by mouth daily.     ondansetron 4 MG tablet  Commonly known as:  ZOFRAN  Take 1 tablet (4 mg total) by mouth every 6 (six) hours as needed for nausea.     OXYGEN  Inhale 2 L into the lungs as needed (with exertion to maintain SaO2 at >/ 88%).     ramipril 10 MG capsule  Commonly known as:  ALTACE  TAKE 1 CAPSULE (10 MG TOTAL) BY MOUTH DAILY.     tiotropium 18 MCG inhalation capsule  Commonly known as:  SPIRIVA HANDIHALER  PLACE 1 CAPSULE (18 MCG TOTAL) INTO INHALER AND INHALE DAILY.     UNABLE TO FIND  Med Name: House Shake  With lunch and dinner per resident request     Vitamin D3 2000 units Tabs  Take 1 tablet by mouth daily at 2 PM.        Review of Systems  Immunization History  Administered Date(s) Administered  . Influenza Split 07/15/2011, 06/03/2012  . Influenza Whole 11/21/2005, 06/24/2007, 06/15/2008, 07/12/2009, 06/13/2010  . Influenza,inj,Quad PF,36+ Mos 06/23/2013, 06/16/2014,  07/25/2015  . Influenza-Unspecified 11/10/2015  . PPD Test 11/14/2015, 11/30/2015  . Pneumococcal Conjugate-13 09/10/2013  . Pneumococcal Polysaccharide-23 09/24/2003  . Pneumococcal-Unspecified 11/10/2015  . Td 09/23/2002   Pertinent  Health Maintenance Due  Topic Date Due  . DEXA SCAN  09/23/2016 (Originally 06/18/1997)  . INFLUENZA VACCINE  04/23/2016  . PNA vac Low Risk Adult  Completed   Fall Risk  05/17/2015 03/28/2015 07/26/2014 06/16/2014 11/23/2013  Falls in the past year? Yes No No No No  Number falls in past yr: 1 - - - -  Injury with Fall? Yes - - - -  Risk for fall due to : - - - - Other (Comment)   Functional Status Survey:    Filed Vitals:   03/04/16 1717  BP: 143/61  Pulse: 78  Temp: 98.2 F (36.8 C)  Resp: 18  Height: 5\' 5"  (1.651 m)  Weight: 131 lb 12.8 oz (59.784 kg)  SpO2: 94%   Body mass index is 21.93 kg/(m^2). Physical Exam  Labs reviewed:  Recent Labs  11/07/15 1351 11/08/15 0648 11/09/15 0347 11/15/15 11/20/15  NA 137 142 144 145 143  K 3.5 3.8 3.9 4.0 4.5  CL 99* 107 106  --   --   CO2 28 27 32  --   --   GLUCOSE 127* 154* 154*  --   --   BUN 35* 20 16 13 14   CREATININE 1.08* 0.67 0.52 0.5 0.5  CALCIUM 8.7* 8.7* 8.6*  --   --     Recent Labs  05/17/15 1522 11/07/15 1351 11/08/15 0648 11/15/15 11/16/15  AST 15 30 27  34 30  ALT 10 16 15  32 34  ALKPHOS 130* 69 66 58 64  BILITOT 0.3 0.5 0.2*  --   --   PROT 7.5 7.1 6.5  --   --   ALBUMIN 3.7 2.9* 2.5*  --   --     Recent Labs  03/28/15 1016  05/17/15 1522 11/07/15 1351 11/08/15 0648 11/09/15 0347 11/15/15  WBC 7.6  < > 8.5 8.0 5.1 6.2 8.6  NEUTROABS 5.6  --  5.9 7.0  --   --   --   HGB 11.9  < > 12.8 9.7* 10.3* 10.5* 10.1*  HCT 36.7  < > 39.0 29.5* 33.0* 34.2* 32*  MCV 92  < > 88.9 87.8 90.7 92.9  --   PLT 197  < > 278.0 206 225 230 360  < > = values in this interval not displayed. Lab Results  Component Value Date   TSH 1.37 05/17/2015   Lab Results  Component Value  Date   HGBA1C 5.9 05/17/2015   Lab Results  Component Value Date   CHOL 117 11/16/2015   HDL 45 11/16/2015   LDLCALC 43 11/16/2015   TRIG 144 11/16/2015   CHOLHDL 3 05/17/2015    Significant Diagnostic Results in last 30 days:  No results found.  Assessment/Plan 1. Essential hypertension B/p stable. Continue on Amlodipine 5 mg Tablet. BMP in 4 weeks.   2. Pulmonary emphysema, unspecified emphysema type ([place]) Stable. Continue Duonebs, Albuterol 108 mcg inhaler, Symbicort 160-4.5 mcg inhaler and Spiriva  18 mcg inhaler. Continue oxygen via nasal cannula.    3. Gastroesophageal reflux disease without esophagitis Continue on Omeprazole.   4. Hyperlipidemia Continue Lipitor 10 mg tablet. Lipid panel in 4 weeks.   5. Arthritis Current pain regimen and restorative activities effective.   6. Abnormality of gait Self propels on wheel chair. Fall and safety precautions.     Family/ staff Communication: Reviewed plan of care with patient and facility Nurse supervisor.  Labs/tests ordered: CBC, BMP, TSH, Hgb A1C and Lipid panel in 4 weeks.

## 2016-03-11 ENCOUNTER — Encounter: Payer: Self-pay | Admitting: Emergency Medicine

## 2016-03-11 ENCOUNTER — Ambulatory Visit (INDEPENDENT_AMBULATORY_CARE_PROVIDER_SITE_OTHER): Payer: Medicare Other | Admitting: Emergency Medicine

## 2016-03-11 VITALS — BP 136/62 | HR 89 | Ht 65.5 in | Wt 130.0 lb

## 2016-03-11 DIAGNOSIS — J439 Emphysema, unspecified: Secondary | ICD-10-CM

## 2016-03-11 NOTE — Patient Instructions (Addendum)
We will continue Spiriva and Symbicort as you are taking them  We will continue albuterol as needed - either by nebulizer or inhaler. You will have to ask for this medication when you need it.  Continue your oxygen as you are using it.  Start guaifenesin 600mg  twice a day  Follow with Dr Lamonte Sakai in 4 months or sooner if you have any problems.

## 2016-03-11 NOTE — Progress Notes (Signed)
Subjective:    Patient ID: [name], female    DOB: 07/16/1930, 82 y.o.   MRN: [id number]  HPI 82 year old woman, former smoker (60 pack years), with a history of COPD, also breast cancer, hypertension, GERD. She has been followed by Dr Gwenette Greet and is on Advair and Spiriva. Her dyspnea has been somewhat worse with the hot humid weather. She has a daily cough prod of white. She uses albuterol rarely. She does have some exertional wheeze. She sleeps with oxygen and with exertion. No flare ups since last time. She did suffer a fall last week to cause her to have some right flank and right hip bruising, no rib fx  Acute OV 06/14/15 -- history of COPD and associated hypoxemia. She presents for acute visit today after she began to have more sputum, dark sputum witthout wheezing. No new fever, congestion, rhinorrhea.  We started her on doxy 2 days ago, already doing better.   ROV 08/30/15 -- History of COPD, hypoxemic respiratory failure. At her last visit she was treated for and acute bronchitis with doxycycline and prednisone.  She feels better since that time.  She has dyspnea when she eats - hard taking deep breath. Some dyspnea w exertion. She has some cough, white clear mucous. No wheeze.   ROV 11/30/15 -- follow-up visit for history of COPD, chronic hypoxemia, and frequent episodes of flaring chronic bronchitis. She was last seen in mid February in our office at which time she was diagnosed with acute exacerbation, possible pneumonia requiring hospitalization.showed left lower lobe infiltrate and her pneumococcal antigen was positive. She was discharged on 2/17 to complete a full course of antibiotics and a prednisone taper. She was started back on her Symbicort and Spiriva. She was d/c to SNF. She coughs most days, clears mucous. Occasional wheeze. No albuterol use. No flutter.   ROV 03/11/16 -- pt with hx COPD, chronic hypoxemic resp failure, recurrent exacerbations. She is still in SNF, is doing  rehab exercises on her own. She gets her spiriva and symbicort reliably. She is on continuous O2 at 2L/min. Uses 3L/min with exertion. She has cough, clears thick beige mucous. She has some wheeze - rare. No flares since last time.    Review of Systems As per HPI      Objective:   Physical Exam Filed Vitals:   03/11/16 1203  BP: 136/62  Pulse: 89  Height: 5' 5.5" (1.664 m)  Weight: 130 lb (58.968 kg)  SpO2: 93%   Gen: Pleasant, in no distress,  normal affect  ENT: No lesions,  mouth clear,  oropharynx clear, no postnasal drip  Neck: No JVD, no TMG, no carotid bruits  Lungs: No use of accessory muscles, distant, few insp squeaks, exp wheeze.   Cardiovascular: RRR, heart sounds normal, no murmur or gallops, trace ankle edema bilaterally  Musculoskeletal: No deformities, no cyanosis or clubbing  Neuro: alert, non focal  Skin: Warm, no lesions or rashes      Assessment & Plan:  COPD (chronic obstructive pulmonary disease) with emphysema (HCC) We will continue Spiriva and Symbicort as you are taking them  We will continue albuterol as needed - either by nebulizer or inhaler. You will have to ask for this medication when you need it.  Continue your oxygen as you are using it.  Start guaifenesin 600mg  twice a day  Follow with Dr Lamonte Sakai in 4 months or sooner if you have any problems.   Baltazar Apo, MD, PhD 03/11/2016, 12:31 PM  [facility] [phone] or if no answer [phone]

## 2016-03-11 NOTE — Assessment & Plan Note (Signed)
We will continue Spiriva and Symbicort as you are taking them  We will continue albuterol as needed - either by nebulizer or inhaler. You will have to ask for this medication when you need it.  Continue your oxygen as you are using it.  Start guaifenesin 600mg  twice a day  Follow with Dr Lamonte Sakai in 4 months or sooner if you have any problems.

## 2016-04-03 DIAGNOSIS — E039 Hypothyroidism, unspecified: Secondary | ICD-10-CM | POA: Diagnosis not present

## 2016-04-03 DIAGNOSIS — E0800 Diabetes mellitus due to underlying condition with hyperosmolarity without nonketotic hyperglycemic-hyperosmolar coma (NKHHC): Secondary | ICD-10-CM | POA: Diagnosis not present

## 2016-04-03 DIAGNOSIS — E785 Hyperlipidemia, unspecified: Secondary | ICD-10-CM | POA: Diagnosis not present

## 2016-04-03 DIAGNOSIS — D508 Other iron deficiency anemias: Secondary | ICD-10-CM | POA: Diagnosis not present

## 2016-04-03 DIAGNOSIS — N189 Chronic kidney disease, unspecified: Secondary | ICD-10-CM | POA: Diagnosis not present

## 2016-04-03 LAB — LIPID PANEL
Cholesterol: 138 mg/dL (ref 0–200)
HDL: 59 mg/dL (ref 35–70)
LDL CALC: 66 mg/dL
LDl/HDL Ratio: 2.4
TRIGLYCERIDES: 67 mg/dL (ref 40–160)

## 2016-04-03 LAB — BASIC METABOLIC PANEL
BUN: 13 mg/dL (ref 4–21)
CREATININE: 0.5 mg/dL (ref <=1.1)
GLUCOSE: 101 mg/dL
POTASSIUM: 4.7 mmol/L (ref 3.4–5.3)
Sodium: 140 mmol/L (ref 137–147)

## 2016-04-03 LAB — TSH: TSH: 2.96 u[IU]/mL (ref <=5.90)

## 2016-04-03 LAB — CBC AND DIFFERENTIAL
HCT: 35 % — AB (ref 36–46)
Hemoglobin: 10.5 g/dL — AB (ref 12.0–16.0)
PLATELETS: 193 10*3/uL (ref 150–399)
WBC: 5 10*3/mL

## 2016-04-03 LAB — HEMOGLOBIN A1C: HEMOGLOBIN A1C: 5.8

## 2016-04-09 ENCOUNTER — Encounter: Payer: Self-pay | Admitting: Family

## 2016-04-09 ENCOUNTER — Non-Acute Institutional Stay (SKILLED_NURSING_FACILITY): Payer: Medicare Other | Admitting: Family

## 2016-04-09 DIAGNOSIS — M25551 Pain in right hip: Secondary | ICD-10-CM | POA: Diagnosis not present

## 2016-04-09 DIAGNOSIS — E785 Hyperlipidemia, unspecified: Secondary | ICD-10-CM

## 2016-04-09 DIAGNOSIS — F419 Anxiety disorder, unspecified: Secondary | ICD-10-CM

## 2016-04-09 DIAGNOSIS — Z789 Other specified health status: Secondary | ICD-10-CM | POA: Diagnosis not present

## 2016-04-09 DIAGNOSIS — K219 Gastro-esophageal reflux disease without esophagitis: Secondary | ICD-10-CM | POA: Diagnosis not present

## 2016-04-09 DIAGNOSIS — J439 Emphysema, unspecified: Secondary | ICD-10-CM

## 2016-04-09 DIAGNOSIS — I10 Essential (primary) hypertension: Secondary | ICD-10-CM

## 2016-04-09 NOTE — Progress Notes (Signed)
Location:    [facility] Room Number: 903-B Place of Service:   SNF 31  Provider: Marlowe Sax FNP-C   Patient Care Team: Blanchie Serve, MD as PCP - General (Internal Medicine)  Extended Emergency Contact Information Primary Emergency Contact: McDaniel,Cathy Address: [address]          [place], [place] [address] [place] Phone: [phone] Mobile Phone: [phone] Relation: Sister Secondary Emergency Contact: Sheilah Pigeon [place] Phone: [phone] Relation: Son  Code Status: DNI  Goals of care: Advanced Directive information Advanced Directives 04/09/2016  Does patient have an advance directive? No  Type of Advance Directive -  Does patient want to make changes to advanced directive? -  Copy of advanced directive(s) in chart? -  Would patient like information on creating an advanced directive? Yes - Environmental education officer Complaint  Patient presents with  . Medical Management of Chronic Issues    HPI:  Pt is a 82 y.o. female seen today at [facility] for medical management of chronic diseases. She is seen in her room today. She denies any acute issues. She states would like to change her code status. She had DNR status but states would like CPR to be perform but does not any intubation. Facility Nurse supervisor notified to remove DNR bracelet. Full code status form signed. DNI order written this visit.      Past Medical History  Diagnosis Date  . Other emphysema ([place])   . Unspecified essential hypertension   . Osteoporosis, unspecified   . Other and unspecified hyperlipidemia   . Esophageal reflux   . Unspecified asthma(493.90)   . Cancer ([place])   . Arthritis   . Emphysema of lung ([place])   . PONV (postoperative nausea and vomiting)   . On home oxygen therapy     at night  . Full dentures   . Wears glasses   . History of radiation therapy  07/01/06-08/13/06    left breast  total dose 6100 cGy  . Breast cancer ([place]) 04/11/06 bx    left breast invasive cductal ca  . 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 Anxiety   . Depression   . Emphysema   . Cataract     extraction  . Allergy   . Hx of radiation therapy 08/31/12-09/28/12    right breast    Past Surgical History  Procedure Laterality Date  . Partial hysterectomy    . Cataract extraction    . Hip surgery  1993    left  . Breast surgery    . Cyst on face  6 months ago  . Mass excision  02/18/2012    Procedure: MINOR EXCISION OF MASS;  Surgeon: Adin Hector, MD;  Location: [facility];  Service: General;  Laterality: Left;  excise 2.5 cm. mass left chest wall  . Mastectomy  05/14/06    lt lumpectomy-node dissDr.Leone  . Breast lumpectomy  07/13/12    right -DCIS 7 o'clock stage Tis,Nx  . Abdominal hysterectomy      partial    Allergies  Allergen Reactions  . Neosporin [Neomycin-Polymyxin B Gu] Itching and Rash  . Codeine Nausea Only  . Irbesartan Hives    Avapro  . Adhesive [Tape] Itching and Rash    To paper tape only. Rash and itching only where  applied.      Medication List       This list is accurate as of: 04/09/16  1:31 PM.  Always use your most recent med list.               acetaminophen 325 MG tablet  Commonly known as:  TYLENOL  Take 2 tablets (650 mg total) by mouth every 6 (six) hours as needed for mild pain (or Fever >/= 101).     albuterol (2.5 MG/3ML) 0.083% nebulizer solution  Commonly known as:  PROVENTIL  Take 2.5 mg by nebulization every 4 (four) hours as needed for wheezing or shortness of breath.     albuterol 108 (90 Base) MCG/ACT inhaler  Commonly known as:  PROVENTIL HFA  Inhale 2 puffs into the lungs every 6 (six) hours as needed.     ALPRAZolam 0.5 MG tablet  Commonly known as:  XANAX  Take 1 tablet 3 times a daily 6am - 2pm,  10pm ( please hold for sedation). Then take 1 tablet by mouth 2 times daily as needed for anxiety.     amLODipine 5 MG tablet  Commonly known as:  NORVASC  TAKE 1 TABLET (5 MG TOTAL) BY MOUTH DAILY.     aspirin 81 MG chewable tablet  Chew 81 mg by mouth daily.     atorvastatin 10 MG tablet  Commonly known as:  LIPITOR  TAKE 1 TABLET (10 MG TOTAL) BY MOUTH DAILY.     budesonide-formoterol 160-4.5 MCG/ACT inhaler  Commonly known as:  SYMBICORT  Inhale 2 puffs into the lungs 2 (two) times daily.     docusate sodium 100 MG capsule  Commonly known as:  COLACE  Take 100 mg by mouth daily. For constipation     guaiFENesin 600 MG 12 hr tablet  Commonly known as:  MUCINEX  Take 600 mg by mouth 2 (two) times daily.     omeprazole 20 MG capsule  Commonly known as:  PRILOSEC  Take 1 capsule (20 mg total) by mouth daily.     ondansetron 4 MG tablet  Commonly known as:  ZOFRAN  Take 1 tablet (4 mg total) by mouth every 6 (six) hours as needed for nausea.     OXYGEN  Inhale 2 L into the lungs as needed (with exertion to maintain SaO2 at >/ 88%).     ramipril 10 MG capsule  Commonly known as:  ALTACE  TAKE 1 CAPSULE (10 MG TOTAL) BY MOUTH DAILY.     tiotropium 18 MCG inhalation capsule  Commonly known as:  SPIRIVA HANDIHALER  PLACE 1 CAPSULE (18 MCG TOTAL) INTO INHALER AND INHALE DAILY.     UNABLE TO FIND  Med Name: House Shake  With lunch and dinner per resident request     Vitamin D3 2000 units Tabs  Take 1 tablet by mouth daily at 2 PM.        Review of Systems  Constitutional: Negative for fever, chills, activity change, appetite change and fatigue.  HENT: Negative for congestion, rhinorrhea, sinus pressure, sneezing and sore throat.   Eyes: Negative.   Respiratory: Negative for cough, chest tightness, shortness of breath and wheezing.   Cardiovascular: Negative for chest pain, palpitations and leg swelling.  Gastrointestinal: Negative for nausea, vomiting, abdominal  pain, diarrhea, constipation and abdominal distention.  Endocrine: Negative.   Genitourinary: Negative for dysuria, urgency, frequency and flank pain.  Musculoskeletal: Positive for gait problem.       Uses wheelchair  Skin: Negative.   Neurological: Negative for dizziness, seizures, syncope, light-headedness and headaches.  Psychiatric/Behavioral: Negative for hallucinations, confusion, sleep disturbance and agitation. The patient is not hyperactive.     Immunization History  Administered Date(s) Administered  . Influenza Split 07/15/2011, 06/03/2012  . Influenza Whole 11/21/2005, 06/24/2007, 06/15/2008, 07/12/2009, 06/13/2010  . Influenza,inj,Quad PF,36+ Mos 06/23/2013, 06/16/2014, 07/25/2015  . Influenza-Unspecified 11/10/2015  . PPD Test 11/14/2015, 11/30/2015  . Pneumococcal Conjugate-13 09/10/2013  . Pneumococcal Polysaccharide-23 09/24/2003  . Pneumococcal-Unspecified 11/10/2015  . Td 09/23/2002   Pertinent  Health Maintenance Due  Topic Date Due  . DEXA SCAN  09/23/2016 (Originally 06/18/1997)  . INFLUENZA VACCINE  04/23/2016  . PNA vac Low Risk Adult  Completed   Fall Risk  05/17/2015 03/28/2015 07/26/2014 06/16/2014 11/23/2013  Falls in the past year? Yes No No No No  Number falls in past yr: 1 - - - -  Injury with Fall? Yes - - - -  Risk for fall due to : - - - - Other (Comment)   Functional Status Survey:    Filed Vitals:   04/09/16 1306  BP: 125/58  Pulse: 74  Temp: 97.3 F (36.3 C)  Resp: 18  Height: 5' 5.5" (1.664 m)  Weight: 130 lb (58.968 kg)  SpO2: 98%   Body mass index is 21.3 kg/(m^2). Physical Exam  Constitutional: She is oriented to person, place, and time. She appears well-developed and well-nourished. No distress.  [facility]  HENT:  Head: Normocephalic.  Mouth/Throat: Oropharynx is clear and moist. No oropharyngeal exudate.  Eyes: Conjunctivae and EOM are normal. Pupils are equal, round, and reactive to light. Right eye exhibits no  discharge. Left eye exhibits no discharge. No scleral icterus.  Neck: Normal range of motion. Neck supple. No JVD present. No thyromegaly present.  Cardiovascular: Normal rate, regular rhythm and intact distal pulses.  Exam reveals no gallop and no friction rub.   No murmur heard. Pulmonary/Chest: Effort normal. No respiratory distress. She has no rales.  Diminished scattered wheezes noted on Auscultation. Oxygen 2 liters via Nasal cannula in place   Abdominal: Soft. Bowel sounds are normal. She exhibits no distension. There is no tenderness. There is no rebound and no guarding.  Musculoskeletal: She exhibits no edema or tenderness.  Unsteady gait. Self propel on Wheelchair   Lymphadenopathy:    She has no cervical adenopathy.  Neurological: She is oriented to person, place, and time.  Skin: Skin is warm and dry. No rash noted. No erythema. No pallor.  Psychiatric: She has a normal mood and affect.    Labs reviewed:  Recent Labs  11/07/15 1351 11/08/15 0648 11/09/15 0347 11/15/15 11/20/15 04/03/16  NA 137 142 144 145 143 140  K 3.5 3.8 3.9 4.0 4.5 4.7  CL 99* 107 106  --   --   --   CO2 28 27 32  --   --   --   GLUCOSE 127* 154* 154*  --   --   --   BUN 35* 20 16 13 14 13   CREATININE 1.08* 0.67 0.52 0.5 0.5 0.5  CALCIUM 8.7* 8.7* 8.6*  --   --   --     Recent Labs  05/17/15 1522 11/07/15 1351 11/08/15 0648 11/15/15 11/16/15  AST 15 30 27  34 30  ALT 10 16 15  32 34  ALKPHOS 130* 69 66 58 64  BILITOT 0.3 0.5 0.2*  --   --   PROT 7.5 7.1 6.5  --   --  ALBUMIN 3.7 2.9* 2.5*  --   --     Recent Labs  05/17/15 1522 11/07/15 1351 11/08/15 0648 11/09/15 0347 11/15/15 04/03/16  WBC 8.5 8.0 5.1 6.2 8.6 5.0  NEUTROABS 5.9 7.0  --   --   --   --   HGB 12.8 9.7* 10.3* 10.5* 10.1* 10.5*  HCT 39.0 29.5* 33.0* 34.2* 32* 35*  MCV 88.9 87.8 90.7 92.9  --   --   PLT 278.0 206 225 230 360 193   Lab Results  Component Value Date   TSH 2.96 04/03/2016   Lab Results    Component Value Date   HGBA1C 5.8 04/03/2016   Lab Results  Component Value Date   CHOL 138 04/03/2016   HDL 59 04/03/2016   LDLCALC 66 04/03/2016   TRIG 67 04/03/2016   CHOLHDL 3 05/17/2015    Significant Diagnostic Results in last 30 days:  No results found.  Assessment/Plan COPD Stable. Afebrile. No increase in sputum production. Continue on oxygen 2 liters via [place]. Continue on Albuterol via Nebulizer, Symbicort and Spiriva.   HTN B/p stable. Continue on Amlodipine 5 mg tablet. Monitor BMP   Hyperlipidemia  Continue Atorvastatin 10 mg Tablet. Monitor lipid panel.   GERD Asymptomatic. Continue on omeprazole 20 mg Capsule.  Anxiety  Stable. Continue on Alprazolam.   Right hip pain  Current pain regimen effective. Continue to monitor. Wean off pain meds as tolerated.   Do not intubate ( DNI) Wishes to change code status to DNI but would like CPR performed in case of cardiac arrest. New Code status form signed  and DNI order written.    Family/ staff Communication: Reviewed plan of care with patient and facility Nurse supervisor.   Labs/tests ordered:  None

## 2016-04-15 IMAGING — CR DG LUMBAR SPINE COMPLETE 4+V
5 series · 5 of 5 positions shown · non-contrast
Comparison: None.

CLINICAL DATA: Back pain status post fall 1 month ago.

EXAM:
LUMBAR SPINE - COMPLETE 4+ VIEW

[view not recorded (1 of 5)]
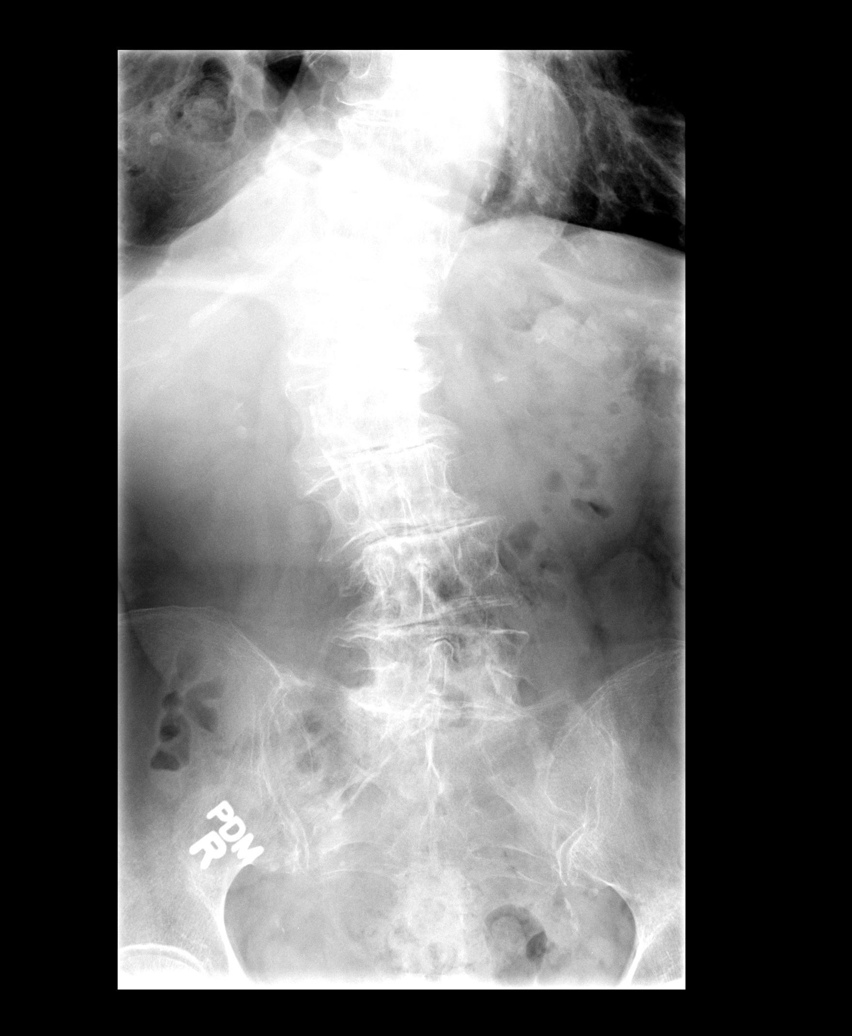

[view not recorded (2 of 5)]
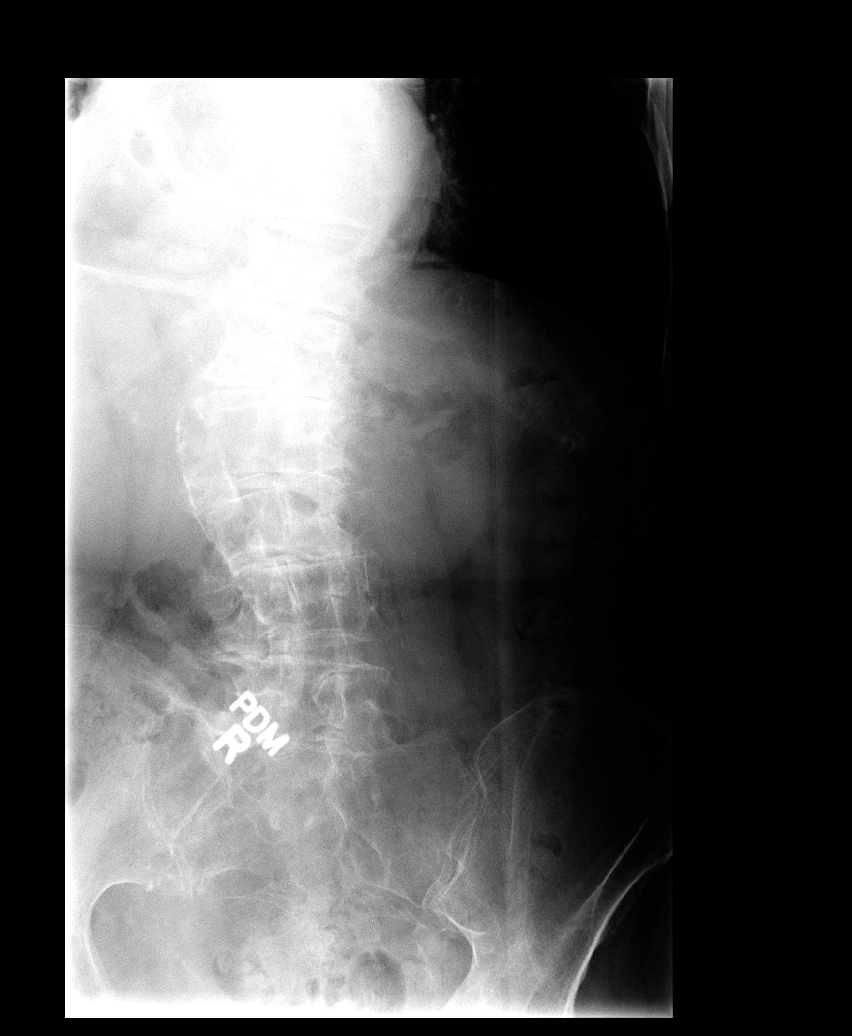

[view not recorded (3 of 5)]
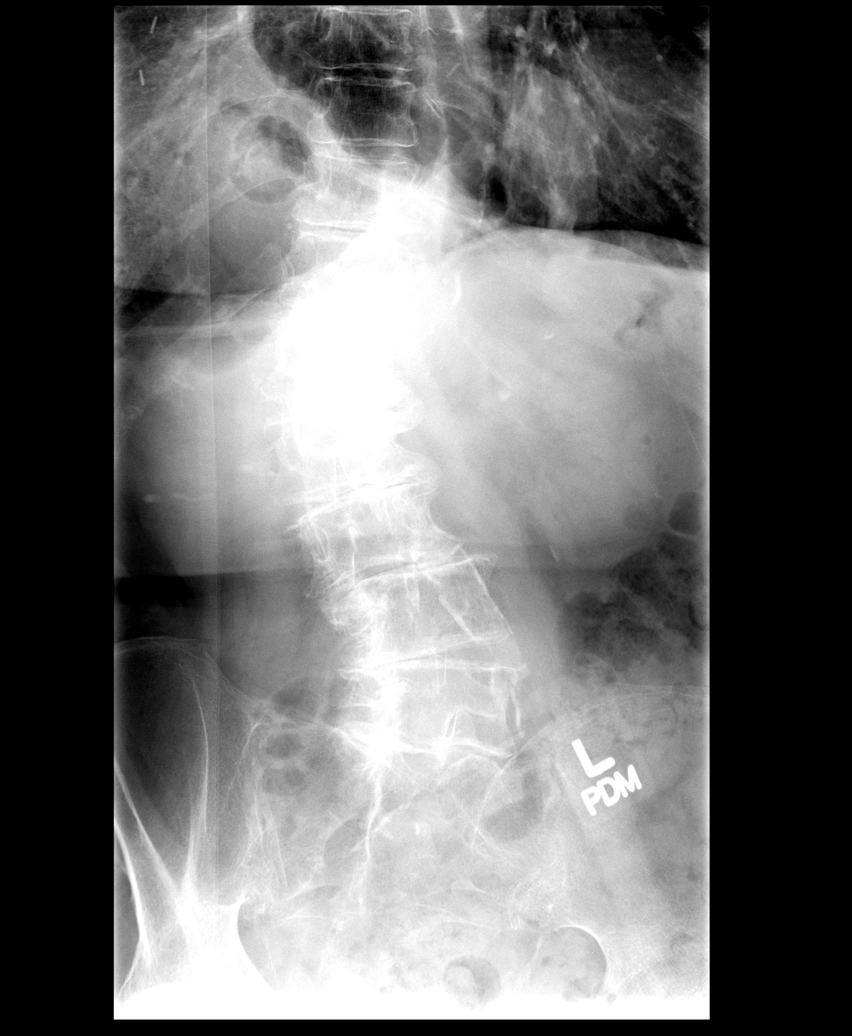

[view not recorded (4 of 5)]
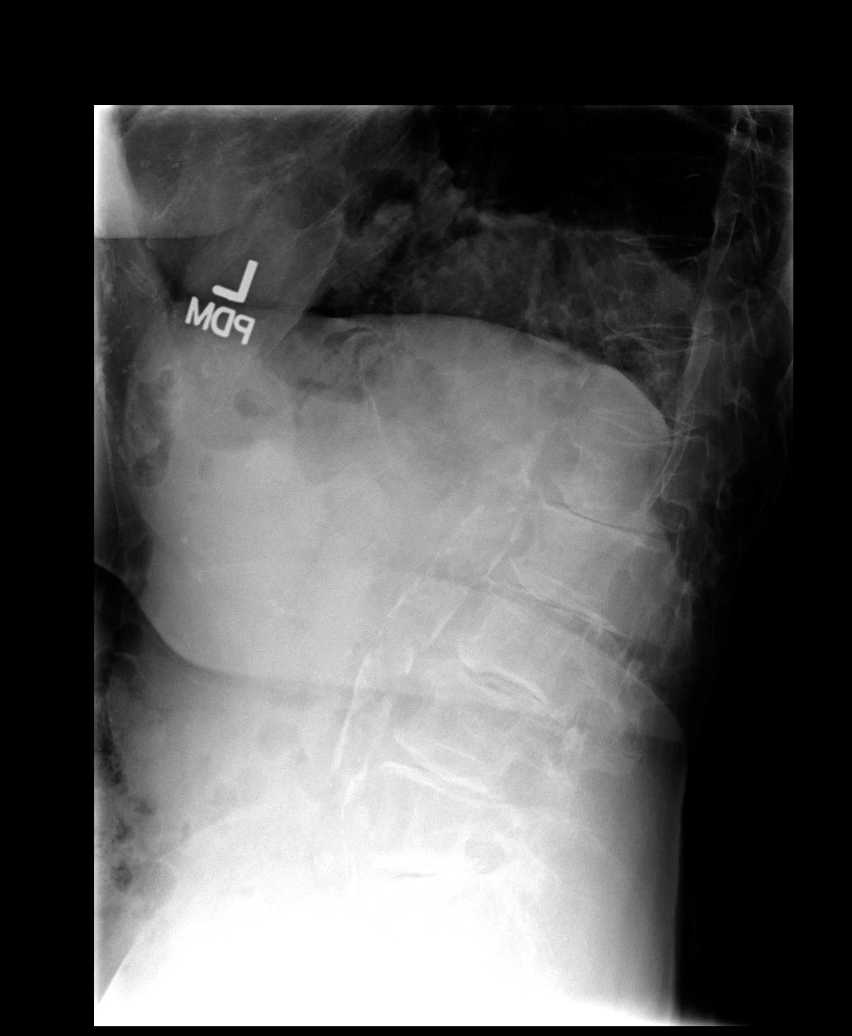

[view not recorded (5 of 5)]
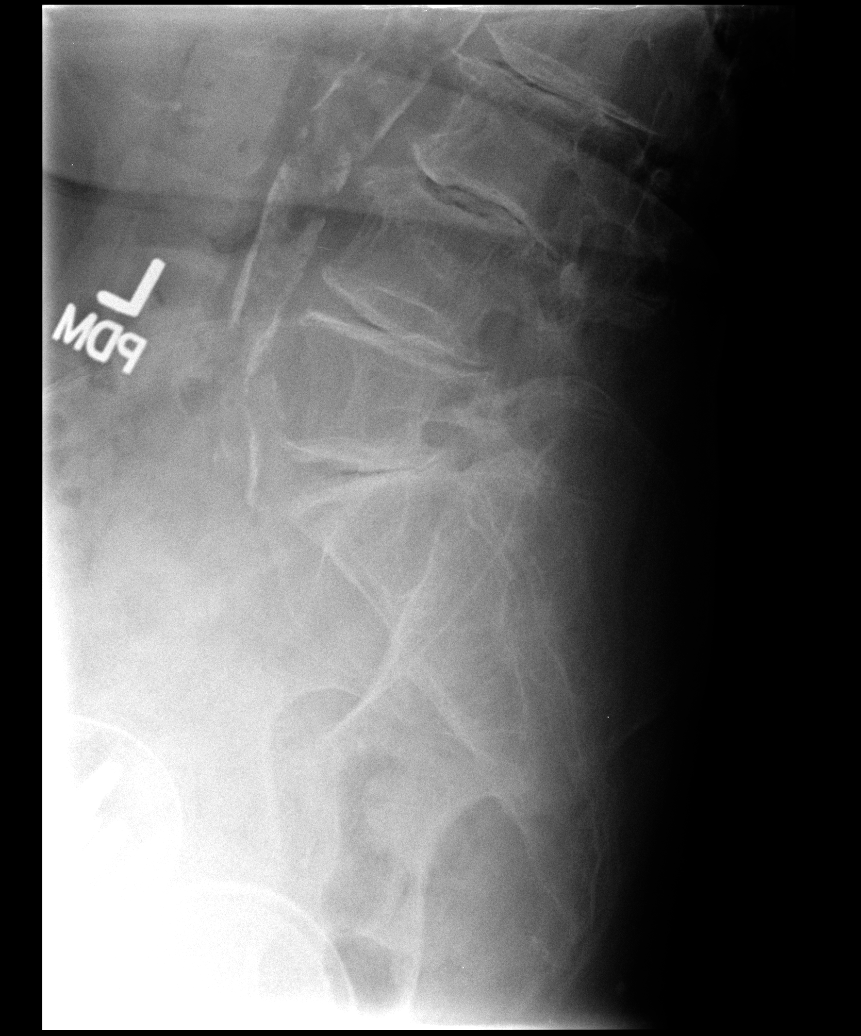

[5 of 5 positions shown; findings below may reference images not displayed]

FINDINGS: There is moderate curvature of the lumbar spine centered at L1-2
with the convexity toward the right. There is multilevel
degenerative disc space narrowing. There is mild lateral subluxation
of L3 with respect to L4. There is no acute compression. There are
anterior and lateral endplate osteophytes at multiple levels.
IMPRESSION: There is no acute compression fracture. There is moderate
dextrocurvature centered at L1-2. There is moderate to severe
multilevel degenerative disc disease.

## 2016-04-22 ENCOUNTER — Other Ambulatory Visit: Payer: Self-pay

## 2016-04-22 MED ORDER — ALPRAZOLAM 0.5 MG PO TABS: ORAL_TABLET | ORAL | 5 refills | Status: DC

## 2016-04-22 NOTE — Telephone Encounter (Signed)
Rx faxed to [facility] @ [phone], phone number [phone]  

## 2016-05-02 ENCOUNTER — Non-Acute Institutional Stay (SKILLED_NURSING_FACILITY): Payer: Medicare Other | Admitting: Internal Medicine

## 2016-05-02 ENCOUNTER — Encounter: Payer: Self-pay | Admitting: Internal Medicine

## 2016-05-02 DIAGNOSIS — J439 Emphysema, unspecified: Secondary | ICD-10-CM

## 2016-05-02 DIAGNOSIS — E785 Hyperlipidemia, unspecified: Secondary | ICD-10-CM

## 2016-05-02 DIAGNOSIS — J9612 Chronic respiratory failure with hypercapnia: Secondary | ICD-10-CM | POA: Diagnosis not present

## 2016-05-02 DIAGNOSIS — E46 Unspecified protein-calorie malnutrition: Secondary | ICD-10-CM | POA: Diagnosis not present

## 2016-05-02 DIAGNOSIS — I10 Essential (primary) hypertension: Secondary | ICD-10-CM | POA: Diagnosis not present

## 2016-05-02 DIAGNOSIS — F411 Generalized anxiety disorder: Secondary | ICD-10-CM | POA: Diagnosis not present

## 2016-05-02 DIAGNOSIS — K219 Gastro-esophageal reflux disease without esophagitis: Secondary | ICD-10-CM | POA: Diagnosis not present

## 2016-05-02 NOTE — Progress Notes (Signed)
Patient ID: [name], female   DOB: [birth year], 82 y.o.   MRN: [id number]    LOCATION: [facility]  PCP: Blanchie Serve, MD   Code Status: Full Code  Goals of care: Advanced Directive information Advanced Directives 04/09/2016  Does patient have an advance directive? No  Type of Advance Directive -  Does patient want to make changes to advanced directive? -  Copy of advanced directive(s) in chart? -  Would patient like information on creating an advanced directive? Yes - Scientist, clinical (histocompatibility and immunogenetics) given       Extended Emergency Contact Information Primary Emergency Contact: McDaniel,Cathy Address: [address]          [place], [place] [address] [place] Phone: [phone] Mobile Phone: [phone] Relation: Sister Secondary Emergency Contact: Sheilah Pigeon [place] Phone: [phone] Relation: Son   Allergies  Allergen Reactions  . Neosporin [Neomycin-Polymyxin B Gu] Itching and Rash  . Codeine Nausea Only  . Irbesartan Hives    Avapro  . Adhesive [Tape] Itching and Rash    To paper tape only. Rash and itching only where applied.    Chief Complaint  Patient presents with  . Medical Management of Chronic Issues    Routine Visit     HPI:  Patient is a 82 y.o. female seen today for routine visit. She continues to be on o2 by nasal canula for her chronic respiratory failure with copd. She denies any concern this visit. She gets around with her wheelchair.   Review of Systems:  Constitutional: Negative for fever, chills. HENT: Negative for headache, congestion, nasal discharge, sore throat, difficulty swallowing.   Eyes: Negative for blurred vision, double vision and discharge.  Respiratory: positive for cough and shortness of breath. Denies wheezing.   Cardiovascular: Negative for chest pain, leg swelling.  Gastrointestinal: Negative for heartburn, nausea, vomiting, abdominal pain Genitourinary: Negative for  dysuria Musculoskeletal: Negative for fall Skin: Negative for itching, rash.  Neurological: Negative for dizziness   Past Medical History:  Diagnosis Date  . Allergy   . Anxiety   . Arthritis   . Breast cancer ([place]) 04/11/06 bx   left breast invasive cductal ca  . 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 Cancer ([place])   . Cataract    extraction  . Depression   . Emphysema   . Emphysema of lung ([place])   . Esophageal reflux   . Full dentures   . History of radiation therapy 07/01/06-08/13/06   left breast  total dose 6100 cGy  . Hx of radiation therapy 08/31/12-09/28/12   right breast   . On home oxygen therapy    at night  . Osteoporosis, unspecified   . Other and unspecified hyperlipidemia   . Other emphysema ([place])   . PONV (postoperative nausea and vomiting)   . Unspecified asthma(493.90)   . Unspecified essential hypertension   . Wears glasses    Past Surgical History:  Procedure Laterality Date  . ABDOMINAL HYSTERECTOMY     partial  . BREAST LUMPECTOMY  07/13/12   right -DCIS 7 o'clock stage Tis,Nx  . BREAST SURGERY    . CATARACT EXTRACTION    . cyst on face  6 months ago  . [place]   left  . MASS EXCISION  02/18/2012   Procedure: MINOR EXCISION OF MASS;  Surgeon: Adin Hector, MD;  Location: [facility];  Service: General;  Laterality: Left;  excise 2.5 cm. mass left chest wall  . MASTECTOMY  05/14/06   lt lumpectomy-node dissDr.Leone  . PARTIAL HYSTERECTOMY       Medications:   Medication List       Accurate as of 05/02/16 11:19 AM. Always use your most recent med list.          acetaminophen 325 MG tablet Commonly known as:  TYLENOL Take 2 tablets (650 mg total) by mouth every 6 (six) hours as needed for mild pain (or Fever >/= 101).   albuterol (2.5 MG/3ML) 0.083% nebulizer solution Commonly known as:  PROVENTIL Take 2.5 mg by nebulization every 4  (four) hours as needed for wheezing or shortness of breath.   albuterol 108 (90 Base) MCG/ACT inhaler Commonly known as:  PROVENTIL HFA Inhale 2 puffs into the lungs every 6 (six) hours as needed.   ALPRAZolam 0.25 MG tablet Commonly known as:  XANAX Take 0.25 mg by mouth daily. Give at 2 pm   ALPRAZolam 0.5 MG tablet Commonly known as:  XANAX Take 0.5 mg by mouth daily. Give at 6 am and 10 pm   amLODipine 5 MG tablet Commonly known as:  NORVASC TAKE 1 TABLET (5 MG TOTAL) BY MOUTH DAILY.   aspirin 81 MG chewable tablet Chew 81 mg by mouth daily.   atorvastatin 10 MG tablet Commonly known as:  LIPITOR TAKE 1 TABLET (10 MG TOTAL) BY MOUTH DAILY.   budesonide-formoterol 160-4.5 MCG/ACT inhaler Commonly known as:  SYMBICORT Inhale 2 puffs into the lungs 2 (two) times daily.   docusate sodium 100 MG capsule Commonly known as:  COLACE Take 100 mg by mouth daily. For constipation   guaiFENesin 600 MG 12 hr tablet Commonly known as:  MUCINEX Take 600 mg by mouth 2 (two) times daily.   HYDROcodone-homatropine 5-1.5 MG/5ML syrup Commonly known as:  HYCODAN Take 5 mLs by mouth every 6 (six) hours as needed for cough.   omeprazole 20 MG capsule Commonly known as:  PRILOSEC Take 1 capsule (20 mg total) by mouth daily.   ondansetron 4 MG tablet Commonly known as:  ZOFRAN Take 1 tablet (4 mg total) by mouth every 6 (six) hours as needed for nausea.   OXYGEN Inhale 2 L into the lungs as needed (with exertion to maintain SaO2 at >/ 88%).   ramipril 10 MG capsule Commonly known as:  ALTACE TAKE 1 CAPSULE (10 MG TOTAL) BY MOUTH DAILY.   tiotropium 18 MCG inhalation capsule Commonly known as:  SPIRIVA HANDIHALER PLACE 1 CAPSULE (18 MCG TOTAL) INTO INHALER AND INHALE DAILY.   UNABLE TO FIND Med Name: House Shake  With lunch and dinner per resident request   Vitamin D3 2000 units Tabs Take 1 tablet by mouth daily at 2 PM.       Immunizations: Immunization History    Administered Date(s) Administered  . Influenza Split 07/15/2011, 06/03/2012  . Influenza Whole 11/21/2005, 06/24/2007, 06/15/2008, 07/12/2009, 06/13/2010  . Influenza,inj,Quad PF,36+ Mos 06/23/2013, 06/16/2014, 07/25/2015  . Influenza-Unspecified 11/10/2015  . PPD Test 11/14/2015, 11/30/2015  . Pneumococcal Conjugate-13 09/10/2013  . Pneumococcal Polysaccharide-23 09/24/2003  . Pneumococcal-Unspecified 11/10/2015  . Td 09/23/2002     Physical Exam Vitals:   05/02/16 1103  BP: 126/72  Pulse: 68  Resp: 16  Temp: 97.2 F (36.2 C)  TempSrc: Oral  SpO2: 95%  Weight: 134 lb 6.4 oz (61 kg)  Height: 5\' 5"  (1.651 m)  Body mass index is 22.37 kg/m.  General- elderly female, frail and thin built, in no acute distress Head- normocephalic, atraumatic Nose- no maxillary or frontal sinus tenderness, no nasal discharge Throat- moist mucus membrane Eyes- PERRLA, EOMI, no pallor, no icterus Neck- no cervical lymphadenopathy Cardiovascular- normal s1,s2, no murmur, no leg edema Respiratory- bilateral poor air entry, no rhonchi, no wheeze, no crackles, not using her accessory muscles, on 2 l o2 Abdomen- bowel sounds present, soft, non tender Musculoskeletal- able to move all 4 extremities, generalized weakness, on wheelchair Neurological- alert and oriented to person, place and time Skin- warm and dry, no diaphoresis Psychiatry- normal mood and affect   Labs reviewed: Basic Metabolic Panel:  Recent Labs  11/07/15 1351 11/08/15 0648 11/09/15 0347 11/15/15 11/20/15 04/03/16  NA 137 142 144 145 143 140  K 3.5 3.8 3.9 4.0 4.5 4.7  CL 99* 107 106  --   --   --   CO2 28 27 32  --   --   --   GLUCOSE 127* 154* 154*  --   --   --   BUN 35* 20 16 13 14 13   CREATININE 1.08* 0.67 0.52 0.5 0.5 0.5  CALCIUM 8.7* 8.7* 8.6*  --   --   --    Liver Function Tests:  Recent Labs  05/17/15 1522 11/07/15 1351 11/08/15 0648 11/15/15 11/16/15  AST 15 30 27  34 30  ALT 10 16 15  32 34   ALKPHOS 130* 69 66 58 64  BILITOT 0.3 0.5 0.2*  --   --   PROT 7.5 7.1 6.5  --   --   ALBUMIN 3.7 2.9* 2.5*  --   --    No results for input(s): LIPASE, AMYLASE in the last 8760 hours. No results for input(s): AMMONIA in the last 8760 hours. CBC:  Recent Labs  05/17/15 1522 11/07/15 1351 11/08/15 0648 11/09/15 0347 11/15/15 04/03/16  WBC 8.5 8.0 5.1 6.2 8.6 5.0  NEUTROABS 5.9 7.0  --   --   --   --   HGB 12.8 9.7* 10.3* 10.5* 10.1* 10.5*  HCT 39.0 29.5* 33.0* 34.2* 32* 35*  MCV 88.9 87.8 90.7 92.9  --   --   PLT 278.0 206 225 230 360 193     Assessment/Plan  Chronic respiratory failure On o2 by nasal canula. Continue mucinex 600 mg bid for chronic cough and continue hycodan prn. On albuterol neb q4h prn. Will change this ti tid x 5 days and then every 6h prn. Continue tiotropium and symbicort.  Hypertension Stable, continue aspirin with ramipril and amlodipine, reviewed bmp, monitor BP  Hyperlipidemia Lipid Panel      Component Value Date/Time   CHOL 138 04/03/2016   TRIG 67 04/03/2016   HDL 59 04/03/2016   CHOLHDL 3 05/17/2015 1522   VLDL 26.2 05/17/2015 1522   LDLCALC 66 04/03/2016  LDL at goal, continue lipitor 10 mg daily  gerd Stable, continue omeprazole 20 mg daily.   GAD Stable with her alprazolam 0.5 mg bid and 0.25 mg at noon  Copd Stable, continue o2 with spiriva and symbicort. Change albuterol enb to scheduled as above. Add incentive spirometer  Protein calorie malnutrition Body mass index is 22.37 kg/m.  Wt Readings from Last 3 Encounters:  05/02/16 134 lb 6.4 oz (61 kg)  04/09/16 130 lb (59 kg)  03/11/16 130 lb (59 kg)   Decline anticipated with her progressive copd. Has gained weight. Continue house shake for protein supplementation. Monitor weight. Encourage po intake.  Blanchie Serve, MD Internal Medicine [facility] [address] [place], [place] [address] Cell Phone (Monday-Friday 8 am - 5  pm): [phone] On Call: [phone] and follow prompts after 5 pm and on weekends Office Phone: [phone] Office Fax: [phone]

## 2016-05-17 ENCOUNTER — Encounter: Payer: Self-pay | Admitting: Family

## 2016-05-17 ENCOUNTER — Non-Acute Institutional Stay (SKILLED_NURSING_FACILITY): Payer: Medicare Other | Admitting: Family

## 2016-05-17 DIAGNOSIS — R140 Abdominal distension (gaseous): Secondary | ICD-10-CM

## 2016-05-17 DIAGNOSIS — R112 Nausea with vomiting, unspecified: Secondary | ICD-10-CM | POA: Diagnosis not present

## 2016-05-17 DIAGNOSIS — R141 Gas pain: Secondary | ICD-10-CM

## 2016-05-17 NOTE — Progress Notes (Signed)
Location:   [facility] Room Number: [phone] Place of Service:  SNF (31) Provider:  Dinah Ngetich FNP-C   Blanchie Serve, MD  Patient Care Team: Blanchie Serve, MD as PCP - General (Internal Medicine)  Extended Emergency Contact Information Primary Emergency Contact: McDaniel,Cathy Address: [address]          [place], [place] [address] [place] Phone: [phone] Mobile Phone: [phone] Relation: Sister Secondary Emergency Contact: Sheilah Pigeon [place] Phone: [phone] Relation: Son  Code Status:  Full code  Goals of care: Advanced Directive information Advanced Directives 04/09/2016  Does patient have an advance directive? No  Type of Advance Directive -  Does patient want to make changes to advanced directive? -  Copy of advanced directive(s) in chart? -  Would patient like information on creating an advanced directive? Yes - Environmental education officer Complaint  Patient presents with  . Acute Visit    HPI:  Pt is a 82 y.o. female seen today at [facility] for an acute visit for evaluation of bloating,Gas, nausea and vomiting.She states had an episode of nausea previous evening resulting to vomiting. She was unable to eat her dinner. Also states feelings of bloating and increased gas pain. He symptoms resolved after she was given Zofran for nausea and vomiting though not sure whether symptoms went away after vomiting or due to Zofran. Facility Nurse reports patient vomited bile like emesis and had gas.  Patient states feels much better this morning no more episodes of nausea but continues to have gas. She denies any constipation or diarrhea. LBM one day ago 05/16/2016.    Past Medical History:  Diagnosis Date  . Allergy   . Anxiety   . Arthritis   . Breast cancer ([place]) 04/11/06 bx   left breast invasive cductal ca  . 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 Cancer ([place])   . Cataract    extraction  . Depression   . Emphysema   . Emphysema of lung ([place])   . Esophageal reflux   . Full dentures   . History of radiation therapy 07/01/06-08/13/06   left breast  total dose 6100 cGy  . Hx of radiation therapy 08/31/12-09/28/12   right breast   . On home oxygen therapy    at night  . Osteoporosis, unspecified   . Other and unspecified hyperlipidemia   . Other emphysema ([place])   . PONV (postoperative nausea and vomiting)   . Unspecified asthma(493.90)   . Unspecified essential hypertension   . Wears glasses    Past Surgical History:  Procedure Laterality Date  . ABDOMINAL HYSTERECTOMY     partial  . BREAST LUMPECTOMY  07/13/12   right -DCIS 7 o'clock stage Tis,Nx  . BREAST SURGERY    . CATARACT EXTRACTION    . cyst on face  6 months ago  . [place]   left  . MASS EXCISION  02/18/2012   Procedure: MINOR EXCISION OF MASS;  Surgeon: Adin Hector, MD;  Location: [facility];  Service: General;  Laterality: Left;  excise 2.5 cm. mass left chest wall  . MASTECTOMY  05/14/06   lt lumpectomy-node dissDr.Leone  . PARTIAL HYSTERECTOMY      Allergies  Allergen Reactions  . Neosporin [Neomycin-Polymyxin B Gu] Itching and Rash  .  Codeine Nausea Only  . Irbesartan Hives    Avapro  . Adhesive [Tape] Itching and Rash    To paper tape only. Rash and itching only where applied.      Medication List       Accurate as of 05/17/16  1:15 PM. Always use your most recent med list.          acetaminophen 325 MG tablet Commonly known as:  TYLENOL Take 2 tablets (650 mg total) by mouth every 6 (six) hours as needed for mild pain (or Fever >/= 101).   albuterol (2.5 MG/3ML) 0.083% nebulizer solution Commonly known as:  PROVENTIL Take 2.5 mg by nebulization every 4 (four) hours as needed for wheezing or shortness of breath.   albuterol 108  (90 Base) MCG/ACT inhaler Commonly known as:  PROVENTIL HFA Inhale 2 puffs into the lungs every 6 (six) hours as needed.   ALPRAZolam 0.25 MG tablet Commonly known as:  XANAX Take 0.25 mg by mouth daily. Give at 2 pm   ALPRAZolam 0.5 MG tablet Commonly known as:  XANAX Take 0.5 mg by mouth daily. Give at 6 am and 10 pm   amLODipine 5 MG tablet Commonly known as:  NORVASC TAKE 1 TABLET (5 MG TOTAL) BY MOUTH DAILY.   aspirin 81 MG chewable tablet Chew 81 mg by mouth daily.   atorvastatin 10 MG tablet Commonly known as:  LIPITOR TAKE 1 TABLET (10 MG TOTAL) BY MOUTH DAILY.   budesonide-formoterol 160-4.5 MCG/ACT inhaler Commonly known as:  SYMBICORT Inhale 2 puffs into the lungs 2 (two) times daily.   docusate sodium 100 MG capsule Commonly known as:  COLACE Take 100 mg by mouth daily. For constipation   guaiFENesin 600 MG 12 hr tablet Commonly known as:  MUCINEX Take 600 mg by mouth 2 (two) times daily.   HYDROcodone-homatropine 5-1.5 MG/5ML syrup Commonly known as:  HYCODAN Take 5 mLs by mouth every 6 (six) hours as needed for cough.   omeprazole 20 MG capsule Commonly known as:  PRILOSEC Take 1 capsule (20 mg total) by mouth daily.   ondansetron 4 MG tablet Commonly known as:  ZOFRAN Take 1 tablet (4 mg total) by mouth every 6 (six) hours as needed for nausea.   OXYGEN Inhale 2 L into the lungs as needed (with exertion to maintain SaO2 at >/ 88%).   ramipril 10 MG capsule Commonly known as:  ALTACE TAKE 1 CAPSULE (10 MG TOTAL) BY MOUTH DAILY.   tiotropium 18 MCG inhalation capsule Commonly known as:  SPIRIVA HANDIHALER PLACE 1 CAPSULE (18 MCG TOTAL) INTO INHALER AND INHALE DAILY.   UNABLE TO FIND Med Name: House Shake  With lunch and dinner per resident request   Vitamin D3 2000 units Tabs Take 1 tablet by mouth daily at 2 PM.       Review of Systems  Constitutional: Negative for activity change, appetite change, chills, fatigue and fever.    Respiratory: Negative for cough, chest tightness and shortness of breath.        Continuous oxygen   Cardiovascular: Negative for chest pain, palpitations and leg swelling.  Gastrointestinal: Negative for abdominal distention, constipation and diarrhea.       N/V, gas pain per HPI   Genitourinary: Negative for dysuria, frequency and urgency.  Musculoskeletal: Positive for gait problem.  Skin: Negative for color change, pallor and rash.  Neurological: Negative for dizziness, syncope, light-headedness and headaches.  Psychiatric/Behavioral: Negative for agitation, confusion, hallucinations and sleep disturbance. The  patient is not nervous/anxious.     Immunization History  Administered Date(s) Administered  . Influenza Split 07/15/2011, 06/03/2012  . Influenza Whole 11/21/2005, 06/24/2007, 06/15/2008, 07/12/2009, 06/13/2010  . Influenza,inj,Quad PF,36+ Mos 06/23/2013, 06/16/2014, 07/25/2015  . Influenza-Unspecified 11/10/2015  . PPD Test 11/14/2015, 11/30/2015  . Pneumococcal Conjugate-13 09/10/2013  . Pneumococcal Polysaccharide-23 09/24/2003  . Pneumococcal-Unspecified 11/10/2015  . Td 09/23/2002   Pertinent  Health Maintenance Due  Topic Date Due  . DEXA SCAN  09/23/2016 (Originally 06/18/1997)  . INFLUENZA VACCINE  09/23/2017 (Originally 04/23/2016)  . PNA vac Low Risk Adult  Completed   Fall Risk  05/17/2015 03/28/2015 07/26/2014 06/16/2014 11/23/2013  Falls in the past year? Yes No No No No  Number falls in past yr: 1 - - - -  Injury with Fall? Yes - - - -  Risk for fall due to : - - - - Other (Comment)      Vitals:   05/17/16 1302  BP: 138/78  Pulse: 80  Resp: 20  Temp: 97.6 F (36.4 C)  SpO2: 95%  Weight: 134 lb 6.4 oz (61 kg)  Height: 5\' 5"  (1.651 m)   Body mass index is 22.37 kg/m. Physical Exam  Constitutional: She is oriented to person, place, and time. She appears well-developed and well-nourished. No distress.  Pleasant Elderly  HENT:  Head: Normocephalic.   Mouth/Throat: Oropharynx is clear and moist. No oropharyngeal exudate.  Eyes: Conjunctivae and EOM are normal. Pupils are equal, round, and reactive to light. Right eye exhibits no discharge. Left eye exhibits no discharge. No scleral icterus.  Neck: Normal range of motion. Neck supple. No JVD present. No thyromegaly present.  Cardiovascular: Normal rate, regular rhythm and intact distal pulses.  Exam reveals no gallop and no friction rub.   No murmur heard. Pulmonary/Chest: Effort normal and breath sounds normal. No respiratory distress. She has no rales.   Oxygen 2 liters via Nasal cannula in place   Abdominal: Soft. Bowel sounds are normal. She exhibits no distension. There is no tenderness. There is no rebound and no guarding.  Musculoskeletal: She exhibits no edema or tenderness.  Unsteady gait. Self propel on Wheelchair   Lymphadenopathy:    She has no cervical adenopathy.  Neurological: She is oriented to person, place, and time.  Skin: Skin is warm and dry. No rash noted. No erythema. No pallor.  Psychiatric: She has a normal mood and affect.    Labs reviewed:  Recent Labs  11/07/15 1351 11/08/15 0648 11/09/15 0347 11/15/15 11/20/15 04/03/16  NA 137 142 144 145 143 140  K 3.5 3.8 3.9 4.0 4.5 4.7  CL 99* 107 106  --   --   --   CO2 28 27 32  --   --   --   GLUCOSE 127* 154* 154*  --   --   --   BUN 35* 20 16 13 14 13   CREATININE 1.08* 0.67 0.52 0.5 0.5 0.5  CALCIUM 8.7* 8.7* 8.6*  --   --   --     Recent Labs  11/07/15 1351 11/08/15 0648 11/15/15 11/16/15  AST 30 27 34 30  ALT 16 15 32 34  ALKPHOS 69 66 58 64  BILITOT 0.5 0.2*  --   --   PROT 7.1 6.5  --   --   ALBUMIN 2.9* 2.5*  --   --     Recent Labs  11/07/15 1351 11/08/15 0648 11/09/15 0347 11/15/15 04/03/16  WBC 8.0 5.1 6.2  8.6 5.0  NEUTROABS 7.0  --   --   --   --   HGB 9.7* 10.3* 10.5* 10.1* 10.5*  HCT 29.5* 33.0* 34.2* 32* 35*  MCV 87.8 90.7 92.9  --   --   PLT 206 225 230 360 193   Lab  Results  Component Value Date   TSH 2.96 04/03/2016   Lab Results  Component Value Date   HGBA1C 5.8 04/03/2016   Lab Results  Component Value Date   CHOL 138 04/03/2016   HDL 59 04/03/2016   LDLCALC 66 04/03/2016   TRIG 67 04/03/2016   CHOLHDL 3 05/17/2015   Assessment/Plan 1. Bloating Associated with Gas pain. LBM 05/16/2016. Exam findings negative.   2. Gas pain Start Gas- X 80 mg chewable Tablet every 6 HRS PRN. Continue to monitor.   3. Nausea and vomiting, intractability of vomiting not specified, unspecified vomiting type Had X 1 episode previous night. Zofran effective. Continue Zofran. Continue to monitor. Obtain CBC, CMP 05/20/2016.     Family/ staff Communication:Reviewed plan of care with patient and facility Nurse supervisor.   Labs/tests ordered:  CBC, CMP 05/20/2016.

## 2016-06-10 ENCOUNTER — Non-Acute Institutional Stay (SKILLED_NURSING_FACILITY): Payer: Medicare Other | Admitting: Family

## 2016-06-10 ENCOUNTER — Encounter: Payer: Self-pay | Admitting: Family

## 2016-06-10 DIAGNOSIS — K219 Gastro-esophageal reflux disease without esophagitis: Secondary | ICD-10-CM | POA: Diagnosis not present

## 2016-06-10 DIAGNOSIS — I10 Essential (primary) hypertension: Secondary | ICD-10-CM | POA: Diagnosis not present

## 2016-06-10 DIAGNOSIS — F411 Generalized anxiety disorder: Secondary | ICD-10-CM | POA: Diagnosis not present

## 2016-06-10 DIAGNOSIS — E785 Hyperlipidemia, unspecified: Secondary | ICD-10-CM

## 2016-06-10 DIAGNOSIS — F329 Major depressive disorder, single episode, unspecified: Secondary | ICD-10-CM

## 2016-06-10 DIAGNOSIS — J439 Emphysema, unspecified: Secondary | ICD-10-CM

## 2016-06-10 DIAGNOSIS — F32A Depression, unspecified: Secondary | ICD-10-CM

## 2016-06-10 NOTE — Progress Notes (Signed)
Patient ID: [name], female   DOB: 02/08/1930, 82 y.o.   MRN: [id number]   Location:  [facility] Room Number: 903-B Place of Service:  SNF (31) Provider:  Marlowe Sax, NP  Blanchie Serve, MD  Patient Care Team: Blanchie Serve, MD as PCP - General (Internal Medicine)  Extended Emergency Contact Information Primary Emergency Contact: McDaniel,Cathy Address: [address]          [place], [place] [address] [place] Phone: [phone] Mobile Phone: [phone] Relation: Sister Secondary Emergency Contact: Sheilah Pigeon [place] Phone: [phone] Relation: Son  Code Status:  DNR Goals of care: Advanced Directive information Advanced Directives 04/09/2016  Does patient have an advance directive? No  Type of Advance Directive -  Does patient want to make changes to advanced directive? -  Copy of advanced directive(s) in chart? -  Would patient like information on creating an advanced directive? Yes - Environmental education officer Complaint  Patient presents with  . Medical Management of Chronic Issues    Follow up    HPI:  Pt is a 82 y.o. female seen today at [facility] for medical management of chronic diseases. She has a medical history of COPD, HTN, Depression, Anxiety among there conditions. She is seen in her room today. She states continues to have occasional gas pain worse whenever she has tomato soup. She denies any fever, chills,nausea or vomiting. Facility staff reports no new concerns. No recent fall episodes or hospital admission. She has had a gradual three pound weight gain over two months.    Past Medical History:  Diagnosis Date  . Allergy   . Anxiety   . Arthritis   . Breast cancer ([place]) 04/11/06 bx   left breast invasive cductal ca  . Breast cancer ([place]) 07/01/12 bx   right breast,low grade ductal carcinoma in situ w/assoc  calcification,ER/PR=+,  . Cancer ([place])   . Cataract    extraction  . Depression   . Emphysema   . Emphysema of lung ([place])   . Esophageal reflux   . Full dentures   . History of radiation therapy 07/01/06-08/13/06   left breast  total dose 6100 cGy  . Hx of radiation therapy 08/31/12-09/28/12   right breast   . On home oxygen therapy    at night  . Osteoporosis, unspecified   . Other and unspecified hyperlipidemia   . Other emphysema ([place])   . PONV (postoperative nausea and vomiting)   . Unspecified asthma(493.90)   . Unspecified essential hypertension   . Wears glasses    Past Surgical History:  Procedure Laterality Date  . ABDOMINAL HYSTERECTOMY     partial  . BREAST LUMPECTOMY  07/13/12   right -DCIS 7 o'clock stage Tis,Nx  . BREAST SURGERY    . CATARACT EXTRACTION    . cyst on face  6 months ago  . [place]   left  . MASS EXCISION  02/18/2012   Procedure: MINOR EXCISION OF MASS;  Surgeon: Adin Hector, MD;  Location: [facility];  Service: General;  Laterality: Left;  excise 2.5 cm. mass left chest wall  . MASTECTOMY  05/14/06   lt lumpectomy-node dissDr.Leone  . PARTIAL HYSTERECTOMY      Allergies  Allergen Reactions  . Neosporin [Neomycin-Polymyxin B Gu] Itching and Rash  . Codeine Nausea Only  . Irbesartan Hives  Avapro  . Adhesive [Tape] Itching and Rash    To paper tape only. Rash and itching only where applied.      Medication List       Accurate as of 06/10/16  2:32 PM. Always use your most recent med list.          acetaminophen 325 MG tablet Commonly known as:  TYLENOL Take 2 tablets (650 mg total) by mouth every 6 (six) hours as needed for mild pain (or Fever >/= 101).   albuterol (2.5 MG/3ML) 0.083% nebulizer solution Commonly known as:  PROVENTIL Take 2.5 mg by nebulization every 4 (four) hours as needed for wheezing or shortness of breath.   albuterol 108 (90 Base) MCG/ACT inhaler Commonly known as:  PROVENTIL  HFA Inhale 2 puffs into the lungs every 6 (six) hours as needed.   ALPRAZolam 0.25 MG tablet Commonly known as:  XANAX Take 0.25 mg by mouth daily. Give at 2 pm   ALPRAZolam 0.5 MG tablet Commonly known as:  XANAX Take 0.5 mg by mouth daily. Give at 6 am and 10 pm   amLODipine 5 MG tablet Commonly known as:  NORVASC TAKE 1 TABLET (5 MG TOTAL) BY MOUTH DAILY.   aspirin 81 MG chewable tablet Chew 81 mg by mouth daily.   atorvastatin 10 MG tablet Commonly known as:  LIPITOR TAKE 1 TABLET (10 MG TOTAL) BY MOUTH DAILY.   budesonide-formoterol 160-4.5 MCG/ACT inhaler Commonly known as:  SYMBICORT Inhale 2 puffs into the lungs 2 (two) times daily.   docusate sodium 100 MG capsule Commonly known as:  COLACE Take 100 mg by mouth daily. For constipation   guaiFENesin 600 MG 12 hr tablet Commonly known as:  MUCINEX Take 600 mg by mouth 2 (two) times daily.   HYDROcodone-homatropine 5-1.5 MG/5ML syrup Commonly known as:  HYCODAN Take 5 mLs by mouth every 6 (six) hours as needed for cough.   omeprazole 20 MG capsule Commonly known as:  PRILOSEC Take 1 capsule (20 mg total) by mouth daily.   ondansetron 4 MG tablet Commonly known as:  ZOFRAN Take 1 tablet (4 mg total) by mouth every 6 (six) hours as needed for nausea.   OXYGEN Inhale 2 L into the lungs as needed (with exertion to maintain SaO2 at >/ 88%).   ramipril 10 MG capsule Commonly known as:  ALTACE TAKE 1 CAPSULE (10 MG TOTAL) BY MOUTH DAILY.   simethicone 80 MG chewable tablet Commonly known as:  MYLICON Chew 80 mg by mouth every 6 (six) hours as needed for flatulence.   tiotropium 18 MCG inhalation capsule Commonly known as:  SPIRIVA HANDIHALER PLACE 1 CAPSULE (18 MCG TOTAL) INTO INHALER AND INHALE DAILY.   UNABLE TO FIND Med Name: House Shake  With lunch and dinner per resident request   Vitamin D3 2000 units Tabs Take 1 tablet by mouth daily at 2 PM.       Review of Systems  Constitutional:  Negative for activity change, appetite change, chills, fatigue and fever.  HENT: Negative for congestion, rhinorrhea, sinus pressure, sneezing and sore throat.   Eyes: Negative.   Respiratory: Negative for cough, chest tightness and shortness of breath.        Continuous oxygen   Cardiovascular: Negative for chest pain, palpitations and leg swelling.  Gastrointestinal: Negative for abdominal distention, constipation, diarrhea, nausea and vomiting.        gas pain per HPI   Endocrine: Negative.   Genitourinary: Negative for dysuria, frequency  and urgency.  Musculoskeletal: Positive for gait problem.  Skin: Negative for color change, pallor and rash.  Neurological: Negative for dizziness, syncope, light-headedness and headaches.  Psychiatric/Behavioral: Negative for agitation, confusion, hallucinations and sleep disturbance. The patient is not nervous/anxious.     Immunization History  Administered Date(s) Administered  . Influenza Split 07/15/2011, 06/03/2012  . Influenza Whole 11/21/2005, 06/24/2007, 06/15/2008, 07/12/2009, 06/13/2010  . Influenza,inj,Quad PF,36+ Mos 06/23/2013, 06/16/2014, 07/25/2015  . Influenza-Unspecified 11/10/2015  . PPD Test 11/14/2015, 11/30/2015  . Pneumococcal Conjugate-13 09/10/2013  . Pneumococcal Polysaccharide-23 09/24/2003  . Pneumococcal-Unspecified 11/10/2015  . Td 09/23/2002   Pertinent  Health Maintenance Due  Topic Date Due  . DEXA SCAN  09/23/2016 (Originally 06/18/1997)  . INFLUENZA VACCINE  09/23/2017 (Originally 04/23/2016)  . PNA vac Low Risk Adult  Completed   Fall Risk  05/17/2015 03/28/2015 07/26/2014 06/16/2014 11/23/2013  Falls in the past year? Yes No No No No  Number falls in past yr: 1 - - - -  Injury with Fall? Yes - - - -  Risk for fall due to : - - - - Other (Comment)      Vitals:   06/10/16 1427  BP: (!) 150/82  Pulse: 71  Resp: 19  Temp: 98 F (36.7 C)  TempSrc: Oral  SpO2: 96%  Weight: 136 lb 4 oz (61.8 kg)  Height: 5'  5" (1.651 m)   Body mass index is 22.67 kg/m. Physical Exam  Constitutional: She is oriented to person, place, and time. She appears well-developed and well-nourished. No distress.  Pleasant Elderly  HENT:  Head: Normocephalic.  Mouth/Throat: Oropharynx is clear and moist. No oropharyngeal exudate.  Eyes: Conjunctivae and EOM are normal. Pupils are equal, round, and reactive to light. Right eye exhibits no discharge. Left eye exhibits no discharge. No scleral icterus.  Neck: Normal range of motion. Neck supple. No JVD present. No thyromegaly present.  Cardiovascular: Normal rate, regular rhythm and intact distal pulses.  Exam reveals no gallop and no friction rub.   No murmur heard. Pulmonary/Chest: Effort normal and breath sounds normal. No respiratory distress. She has no rales.   Oxygen 2 liters via Nasal cannula in place   Abdominal: Soft. Bowel sounds are normal. She exhibits no distension. There is no tenderness. There is no rebound and no guarding.  Musculoskeletal: She exhibits no edema or tenderness.  Unsteady gait. Self propel on Wheelchair   Lymphadenopathy:    She has no cervical adenopathy.  Neurological: She is oriented to person, place, and time.  Skin: Skin is warm and dry. No rash noted. No erythema. No pallor.  Psychiatric: She has a normal mood and affect.    Labs reviewed:  Recent Labs  11/07/15 1351 11/08/15 0648 11/09/15 0347 11/15/15 11/20/15 04/03/16  NA 137 142 144 145 143 140  K 3.5 3.8 3.9 4.0 4.5 4.7  CL 99* 107 106  --   --   --   CO2 28 27 32  --   --   --   GLUCOSE 127* 154* 154*  --   --   --   BUN 35* 20 16 13 14 13   CREATININE 1.08* 0.67 0.52 0.5 0.5 0.5  CALCIUM 8.7* 8.7* 8.6*  --   --   --     Recent Labs  11/07/15 1351 11/08/15 0648 11/15/15 11/16/15  AST 30 27 34 30  ALT 16 15 32 34  ALKPHOS 69 66 58 64  BILITOT 0.5 0.2*  --   --  PROT 7.1 6.5  --   --   ALBUMIN 2.9* 2.5*  --   --     Recent Labs  11/07/15 1351  11/08/15 0648 11/09/15 0347 11/15/15 04/03/16  WBC 8.0 5.1 6.2 8.6 5.0  NEUTROABS 7.0  --   --   --   --   HGB 9.7* 10.3* 10.5* 10.1* 10.5*  HCT 29.5* 33.0* 34.2* 32* 35*  MCV 87.8 90.7 92.9  --   --   PLT 206 225 230 360 193   Lab Results  Component Value Date   TSH 2.96 04/03/2016   Lab Results  Component Value Date   HGBA1C 5.8 04/03/2016   Lab Results  Component Value Date   CHOL 138 04/03/2016   HDL 59 04/03/2016   LDLCALC 66 04/03/2016   TRIG 67 04/03/2016   CHOLHDL 3 05/17/2015   Assessment/Plan COPD Stable. Continue on Spiriva, Albuterol and oxygen via nasal cannula.  HTN B/p stable. Continue Ramiril 10 mg Tablet.   GERD Continue on Omeprazole.   Depression Stable. Continues to participate in the facility activities. Enjoys going outside the facility daily for " [employer]".   GAD Current medication effective.   Hyperlipidemia  Continue on Lipitor. Monitor Lipid panel every 3-6 months.    Family/ staff Communication: Reviewed plan of care with patient and facility Nurse supervisor.   Labs/tests ordered:  None

## 2016-07-08 ENCOUNTER — Non-Acute Institutional Stay (SKILLED_NURSING_FACILITY): Payer: Medicare Other | Admitting: Family

## 2016-07-08 DIAGNOSIS — F419 Anxiety disorder, unspecified: Secondary | ICD-10-CM

## 2016-07-08 DIAGNOSIS — K219 Gastro-esophageal reflux disease without esophagitis: Secondary | ICD-10-CM

## 2016-07-08 DIAGNOSIS — F329 Major depressive disorder, single episode, unspecified: Secondary | ICD-10-CM

## 2016-07-08 DIAGNOSIS — J439 Emphysema, unspecified: Secondary | ICD-10-CM

## 2016-07-08 DIAGNOSIS — H9193 Unspecified hearing loss, bilateral: Secondary | ICD-10-CM | POA: Diagnosis not present

## 2016-07-08 DIAGNOSIS — E785 Hyperlipidemia, unspecified: Secondary | ICD-10-CM

## 2016-07-08 DIAGNOSIS — F32A Depression, unspecified: Secondary | ICD-10-CM

## 2016-07-08 DIAGNOSIS — I10 Essential (primary) hypertension: Secondary | ICD-10-CM

## 2016-07-08 NOTE — Progress Notes (Signed)
Location:  [facility] Room Number: [place] of Service:  SNF (31) Provider: Dinah Ngetich FNP-C   Blanchie Serve, MD  Patient Care Team: Blanchie Serve, MD as PCP - General (Internal Medicine)  Extended Emergency Contact Information Primary Emergency Contact: McDaniel,Cathy Address: [address]          [place], [place] [address] [place] Phone: [phone] Mobile Phone: [phone] Relation: Sister Secondary Emergency Contact: Sheilah Pigeon [place] Phone: [phone] Relation: Son  Code Status:  Full code  Goals of care: Advanced Directive information Advanced Directives 04/09/2016  Does patient have an advance directive? No  Type of Advance Directive -  Does patient want to make changes to advanced directive? -  Copy of advanced directive(s) in chart? -  Would patient like information on creating an advanced directive? Yes - Environmental education officer Complaint  Patient presents with  . Medical Management of Chronic Issues    HPI:  Pt is a 82 y.o. female seen today at [facility]  for medical management of chronic diseases. She has a medical history of COPD, HTN, Depression , Anxiety, GERD, Arthritis, chronic back pain among other conditions.She states has been doing well generally but concerned of her worsening hearing loss. She states hears "muffle sounds". She denies any recent fever, chills or cough.She denies any recent fall episodes or hospital admission. Facility staff reports no new concerns.    Past Medical History:  Diagnosis Date  . Allergy   . Anxiety   . Arthritis   . Breast cancer ([place]) 04/11/06 bx   left breast invasive cductal ca  . 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 Cancer ([place])   . Cataract    extraction  . Depression   . Emphysema   . Emphysema of  lung ([place])   . Esophageal reflux   . Full dentures   . History of radiation therapy 07/01/06-08/13/06   left breast  total dose 6100 cGy  . Hx of radiation therapy 08/31/12-09/28/12   right breast   . On home oxygen therapy    at night  . Osteoporosis, unspecified   . Other and unspecified hyperlipidemia   . Other emphysema ([place])   . PONV (postoperative nausea and vomiting)   . Unspecified asthma(493.90)   . Unspecified essential hypertension   . Wears glasses    Past Surgical History:  Procedure Laterality Date  . ABDOMINAL HYSTERECTOMY     partial  . BREAST LUMPECTOMY  07/13/12   right -DCIS 7 o'clock stage Tis,Nx  . BREAST SURGERY    . CATARACT EXTRACTION    . cyst on face  6 months ago  . [place]   left  . MASS EXCISION  02/18/2012   Procedure: MINOR EXCISION OF MASS;  Surgeon: Adin Hector, MD;  Location: [facility];  Service: General;  Laterality: Left;  excise 2.5 cm. mass left chest wall  . MASTECTOMY  05/14/06   lt lumpectomy-node dissDr.Leone  . PARTIAL HYSTERECTOMY      Allergies  Allergen Reactions  . Neosporin [Neomycin-Polymyxin B Gu] Itching and Rash  . Codeine Nausea Only  . Irbesartan Hives    Avapro  . Adhesive [Tape] Itching and Rash    To paper tape only. Rash and itching only where applied.  Medication List       Accurate as of 07/08/16  3:33 PM. Always use your most recent med list.          acetaminophen 325 MG tablet Commonly known as:  TYLENOL Take 2 tablets (650 mg total) by mouth every 6 (six) hours as needed for mild pain (or Fever >/= 101).   albuterol (2.5 MG/3ML) 0.083% nebulizer solution Commonly known as:  PROVENTIL Take 2.5 mg by nebulization every 4 (four) hours as needed for wheezing or shortness of breath.   albuterol 108 (90 Base) MCG/ACT inhaler Commonly known as:  PROVENTIL HFA Inhale 2 puffs into the lungs every 6 (six) hours as needed.   ALPRAZolam 0.25 MG tablet Commonly known as:   XANAX Take 0.25 mg by mouth daily. Give at 2 pm   ALPRAZolam 0.5 MG tablet Commonly known as:  XANAX Take 0.5 mg by mouth daily. Give at 6 am and 10 pm   amLODipine 5 MG tablet Commonly known as:  NORVASC TAKE 1 TABLET (5 MG TOTAL) BY MOUTH DAILY.   aspirin 81 MG chewable tablet Chew 81 mg by mouth daily.   atorvastatin 10 MG tablet Commonly known as:  LIPITOR TAKE 1 TABLET (10 MG TOTAL) BY MOUTH DAILY.   budesonide-formoterol 160-4.5 MCG/ACT inhaler Commonly known as:  SYMBICORT Inhale 2 puffs into the lungs 2 (two) times daily.   docusate sodium 100 MG capsule Commonly known as:  COLACE Take 100 mg by mouth daily. For constipation   guaiFENesin 600 MG 12 hr tablet Commonly known as:  MUCINEX Take 600 mg by mouth 2 (two) times daily.   HYDROcodone-homatropine 5-1.5 MG/5ML syrup Commonly known as:  HYCODAN Take 5 mLs by mouth every 6 (six) hours as needed for cough.   omeprazole 20 MG capsule Commonly known as:  PRILOSEC Take 1 capsule (20 mg total) by mouth daily.   ondansetron 4 MG tablet Commonly known as:  ZOFRAN Take 1 tablet (4 mg total) by mouth every 6 (six) hours as needed for nausea.   OXYGEN Inhale 2 L into the lungs as needed (with exertion to maintain SaO2 at >/ 88%).   ramipril 10 MG capsule Commonly known as:  ALTACE TAKE 1 CAPSULE (10 MG TOTAL) BY MOUTH DAILY.   simethicone 80 MG chewable tablet Commonly known as:  MYLICON Chew 80 mg by mouth every 6 (six) hours as needed for flatulence.   tiotropium 18 MCG inhalation capsule Commonly known as:  SPIRIVA HANDIHALER PLACE 1 CAPSULE (18 MCG TOTAL) INTO INHALER AND INHALE DAILY.   UNABLE TO FIND Med Name: House Shake  With lunch and dinner per resident request   Vitamin D3 2000 units Tabs Take 1 tablet by mouth daily at 2 PM.       Review of Systems  Constitutional: Negative for activity change, appetite change, chills, fatigue and fever.  HENT: Negative for congestion, rhinorrhea, sinus  pressure, sneezing and sore throat.   Eyes: Negative.   Respiratory: Negative for cough, chest tightness and shortness of breath.        Continuous oxygen   Cardiovascular: Negative for chest pain, palpitations and leg swelling.  Gastrointestinal: Negative for abdominal distention, constipation, diarrhea, nausea and vomiting.  Endocrine: Negative.   Genitourinary: Negative for dysuria, frequency and urgency.  Musculoskeletal: Positive for gait problem.  Skin: Negative for color change, pallor and rash.  Neurological: Negative for dizziness, syncope, light-headedness and headaches.  Hematological: Does not bruise/bleed easily.  Psychiatric/Behavioral: Negative for agitation, confusion,  hallucinations and sleep disturbance. The patient is not nervous/anxious.     Immunization History  Administered Date(s) Administered  . Influenza Split 07/15/2011, 06/03/2012  . Influenza Whole 11/21/2005, 06/24/2007, 06/15/2008, 07/12/2009, 06/13/2010  . Influenza,inj,Quad PF,36+ Mos 06/23/2013, 06/16/2014, 07/25/2015  . Influenza-Unspecified 11/10/2015  . PPD Test 11/14/2015, 11/30/2015  . Pneumococcal Conjugate-13 09/10/2013  . Pneumococcal Polysaccharide-23 09/24/2003  . Pneumococcal-Unspecified 11/10/2015  . Td 09/23/2002   Pertinent  Health Maintenance Due  Topic Date Due  . DEXA SCAN  09/23/2016 (Originally 06/18/1997)  . INFLUENZA VACCINE  09/23/2017 (Originally 04/23/2016)  . PNA vac Low Risk Adult  Completed   Fall Risk  05/17/2015 03/28/2015 07/26/2014 06/16/2014 11/23/2013  Falls in the past year? Yes No No No No  Number falls in past yr: 1 - - - -  Injury with Fall? Yes - - - -  Risk for fall due to : - - - - Other (Comment)      Vitals:   07/08/16 1000  BP: 134/68  Pulse: 64  Resp: 20  Temp: 97.8 F (36.6 C)  SpO2: 95%  Weight: 137 lb 3.2 oz (62.2 kg)  Height: 5\' 5"  (1.651 m)   Body mass index is 22.83 kg/m. Physical Exam  Constitutional: She is oriented to person, place, and  time. She appears well-developed and well-nourished. No distress.  Pleasant Elderly in no acute distress.   HENT:  Head: Normocephalic.  Mouth/Throat: Oropharynx is clear and moist. No oropharyngeal exudate.  HOH  Eyes: Conjunctivae and EOM are normal. Pupils are equal, round, and reactive to light. Right eye exhibits no discharge. Left eye exhibits no discharge. No scleral icterus.  Neck: Normal range of motion. Neck supple. No JVD present. No thyromegaly present.  Cardiovascular: Normal rate, regular rhythm and intact distal pulses.  Exam reveals no gallop and no friction rub.   No murmur heard. Pulmonary/Chest: Effort normal and breath sounds normal. No respiratory distress. She has no rales.   Oxygen 2 liters via [place] in place   Abdominal: Soft. Bowel sounds are normal. She exhibits no distension. There is no tenderness. There is no rebound and no guarding.  Genitourinary:  Genitourinary Comments: Continent for both B/B   Musculoskeletal: She exhibits no edema or tenderness.  Unsteady gait. Self propel on Wheelchair   Lymphadenopathy:    She has no cervical adenopathy.  Neurological: She is oriented to person, place, and time.  Skin: Skin is warm and dry. No rash noted. No erythema. No pallor.  Psychiatric: She has a normal mood and affect.    Labs reviewed:  Recent Labs  11/07/15 1351 11/08/15 0648 11/09/15 0347 11/15/15 11/20/15 04/03/16  NA 137 142 144 145 143 140  K 3.5 3.8 3.9 4.0 4.5 4.7  CL 99* 107 106  --   --   --   CO2 28 27 32  --   --   --   GLUCOSE 127* 154* 154*  --   --   --   BUN 35* 20 16 13 14 13   CREATININE 1.08* 0.67 0.52 0.5 0.5 0.5  CALCIUM 8.7* 8.7* 8.6*  --   --   --     Recent Labs  11/07/15 1351 11/08/15 0648 11/15/15 11/16/15  AST 30 27 34 30  ALT 16 15 32 34  ALKPHOS 69 66 58 64  BILITOT 0.5 0.2*  --   --   PROT 7.1 6.5  --   --   ALBUMIN 2.9* 2.5*  --   --  Recent Labs  11/07/15 1351 11/08/15 0648 11/09/15 0347 11/15/15  04/03/16  WBC 8.0 5.1 6.2 8.6 5.0  NEUTROABS 7.0  --   --   --   --   HGB 9.7* 10.3* 10.5* 10.1* 10.5*  HCT 29.5* 33.0* 34.2* 32* 35*  MCV 87.8 90.7 92.9  --   --   PLT 206 225 230 360 193   Lab Results  Component Value Date   TSH 2.96 04/03/2016   Lab Results  Component Value Date   HGBA1C 5.8 04/03/2016   Lab Results  Component Value Date   CHOL 138 04/03/2016   HDL 59 04/03/2016   LDLCALC 66 04/03/2016   TRIG 67 04/03/2016   CHOLHDL 3 05/17/2015   Assessment/Plan 1. Bilateral hearing loss, unspecified hearing loss type Worsening bilateral hearing loss. Refer to ENT for evaluation.   2. Pulmonary emphysema, unspecified emphysema type ([place]) Stable.Afebrile.No increased sputum production.Continue on Albuterol, Symbicort and Spiriva.Mucinex twice daily. Continue on oxygen 2 Liters via [place].   3. Essential hypertension, benign B/p stable. Continue on ramipril. Monitor BMP.   4. Hyperlipidemia LDL goal <100 Continue on Lipitor.   5. Gastroesophageal reflux disease without esophagitis Stable. Continue on Prilosec.   6. Anxiety Continue on Alprazolam.   7. Depression, unspecified depression type Stable. Monitor for mood changes.     Family/ staff Communication: Reviewed plan of care with patient and facility Nurse supervisor.   Labs/tests ordered:  None

## 2016-07-22 ENCOUNTER — Ambulatory Visit (INDEPENDENT_AMBULATORY_CARE_PROVIDER_SITE_OTHER): Payer: Medicare Other | Admitting: Emergency Medicine

## 2016-07-22 ENCOUNTER — Encounter: Payer: Self-pay | Admitting: Emergency Medicine

## 2016-07-22 DIAGNOSIS — J9612 Chronic respiratory failure with hypercapnia: Secondary | ICD-10-CM

## 2016-07-22 DIAGNOSIS — J439 Emphysema, unspecified: Secondary | ICD-10-CM

## 2016-07-22 DIAGNOSIS — T7840XD Allergy, unspecified, subsequent encounter: Secondary | ICD-10-CM

## 2016-07-22 NOTE — Assessment & Plan Note (Signed)
Please continue your Spiriva and Symbicort as you are taking them  Continue mucinex every day Continue your oxygen at 2l/min, increase to 3L/min with exertion.  Flu shot up to date.  Follow with Dr Lamonte Sakai in 6 months or sooner if you have any problems

## 2016-07-22 NOTE — Progress Notes (Signed)
    Subjective:    Patient ID: [name], female    DOB: 12/23/1929, 82 y.o.   MRN: [id number]  HPI 82 year old woman, former smoker (60 pack years), with a history of COPD, also breast cancer, hypertension, GERD. She has been followed by Dr Gwenette Greet and is on Advair and Spiriva. Her dyspnea has been somewhat worse with the hot humid weather. She has a daily cough prod of white. She uses albuterol rarely. She does have some exertional wheeze. She sleeps with oxygen and with exertion. No flare ups since last time. She did suffer a fall last week to cause her to have some right flank and right hip bruising, no rib fx  ROV 11/30/15 -- follow-up visit for history of COPD, chronic hypoxemia, and frequent episodes of flaring chronic bronchitis. She was last seen in mid February in our office at which time she was diagnosed with acute exacerbation, possible pneumonia requiring hospitalization.showed left lower lobe infiltrate and her pneumococcal antigen was positive. She was discharged on 2/17 to complete a full course of antibiotics and a prednisone taper. She was started back on her Symbicort and Spiriva. She was d/c to SNF. She coughs most days, clears mucous. Occasional wheeze. No albuterol use. No flutter.   ROV 03/11/16 -- pt with hx COPD, chronic hypoxemic resp failure, recurrent exacerbations. She is still in SNF, is doing rehab exercises on her own. She gets her spiriva and symbicort reliably. She is on continuous O2 at 2L/min. Uses 3L/min with exertion. She has cough, clears thick beige mucous. She has some wheeze - rare. No flares since last time.   ROV 07/22/16 -- this follow-up visit for history of COPD, associated chronic hypoxemic respiratory failure, recurrent exacerbations. At her last visit we started guaifenesin. She reports little change compared with her last visit. She has been on Symbicort and Spiriva reliably. She has been clearing secretions more effectively.  She has some nasal  congestion, contributes to nocturnal cough.    Review of Systems As per HPI      Objective:   Physical Exam Vitals:   07/22/16 1151  BP: 112/74  Pulse: 100  SpO2: 94%  Weight: 136 lb (61.7 kg)  Height: 5\' 5"  (1.651 m)   Gen: Pleasant, in no distress,  normal affect  ENT: No lesions,  mouth clear,  oropharynx clear, no postnasal drip  Neck: No JVD, no TMG, no carotid bruits  Lungs: No use of accessory muscles, distant, few insp squeaks, exp wheeze.   Cardiovascular: RRR, heart sounds normal, no murmur or gallops, trace ankle edema bilaterally  Musculoskeletal: No deformities, no cyanosis or clubbing  Neuro: alert, non focal  Skin: Warm, no lesions or rashes      Assessment & Plan:  COPD (chronic obstructive pulmonary disease) with emphysema ([place]) Please continue your Spiriva and Symbicort as you are taking them  Continue mucinex every day Continue your oxygen at 2l/min, increase to 3L/min with exertion.  Flu shot up to date.  Follow with Dr Lamonte Sakai in 6 months or sooner if you have any problems  Hypercapnic respiratory failure, chronic ([place]) 2-3L/min as ordered   Allergy  Start loratadine 10mg  daily   Baltazar Apo, MD, PhD 07/22/2016, 12:18 PM [facility] [phone] or if no answer [phone]

## 2016-07-22 NOTE — Assessment & Plan Note (Signed)
2-3L/min as ordered

## 2016-07-22 NOTE — Patient Instructions (Signed)
Please continue your Spiriva and Symbicort as you are taking them  Continue mucinex every day Start loratadine 10mg  daily  Continue your oxygen at 2l/min, increase to 3L/min with exertion.  Flu shot up to date.  Follow with Dr Lamonte Sakai in 6 months or sooner if you have any problems

## 2016-07-22 NOTE — Assessment & Plan Note (Signed)
  Start loratadine 10mg  daily

## 2016-08-12 ENCOUNTER — Non-Acute Institutional Stay (SKILLED_NURSING_FACILITY): Payer: Medicare Other | Admitting: Family

## 2016-08-12 DIAGNOSIS — J439 Emphysema, unspecified: Secondary | ICD-10-CM

## 2016-08-12 DIAGNOSIS — L603 Nail dystrophy: Secondary | ICD-10-CM | POA: Diagnosis not present

## 2016-08-12 DIAGNOSIS — K219 Gastro-esophageal reflux disease without esophagitis: Secondary | ICD-10-CM

## 2016-08-12 DIAGNOSIS — E785 Hyperlipidemia, unspecified: Secondary | ICD-10-CM | POA: Diagnosis not present

## 2016-08-12 DIAGNOSIS — F419 Anxiety disorder, unspecified: Secondary | ICD-10-CM

## 2016-08-12 NOTE — Progress Notes (Signed)
Location:  [facility] Room Number: [place] of Service:  SNF (31) Provider: Ranada Vigorito FNP-C   Blanchie Serve, MD  Patient Care Team: Blanchie Serve, MD as PCP - General (Internal Medicine)  Extended Emergency Contact Information Primary Emergency Contact: McDaniel,Cathy Address: [address]          [place], [place] [address] [place] Phone: [phone] Mobile Phone: [phone] Relation: Sister Secondary Emergency Contact: Sheilah Pigeon [place] Phone: [phone] Relation: Son  Code Status:  Full Code  Goals of care: Advanced Directive information Advanced Directives 04/09/2016  Does patient have an advance directive? No  Type of Advance Directive -  Does patient want to make changes to advanced directive? -  Copy of advanced directive(s) in chart? -  Would patient like information on creating an advanced directive? Yes - Environmental education officer Complaint  Patient presents with  . Medical Management of Chronic Issues    HPI:  Pt is a 81 y.o. female seen today at [facility] for medical management of chronic diseases.She has a medical history of COPD, HTN, Depression, Anxiety among other conditions. She is seen in her room today. She complains of her fingernails being brittle and spitting request biotin or collagen to help prevent brittleness and make her nails stronger.Her Thyroid function is within  normal limits. She denies frequent washing of hands or use of detergents. She was seen by [facility] Loratadine 10 mg tablet added for Rhinitis. No recent fall episodes or hospital admission.    Past Medical History:  Diagnosis Date  . Allergy   . Anxiety   . Arthritis   . Breast cancer ([place]) 04/11/06 bx   left breast invasive cductal ca  . Breast cancer ([place]) 07/01/12 bx   right breast,low grade ductal carcinoma in situ w/assoc  calcification,ER/PR=+,  . Cancer ([place])   . Cataract    extraction  . Depression   . Emphysema   . Emphysema of lung ([place])   . Esophageal reflux   . Full dentures   . History of radiation therapy 07/01/06-08/13/06   left breast  total dose 6100 cGy  . Hx of radiation therapy 08/31/12-09/28/12   right breast   . On home oxygen therapy    at night  . Osteoporosis, unspecified   . Other and unspecified hyperlipidemia   . Other emphysema ([place])   . PONV (postoperative nausea and vomiting)   . Unspecified asthma(493.90)   . Unspecified essential hypertension   . Wears glasses    Past Surgical History:  Procedure Laterality Date  . ABDOMINAL HYSTERECTOMY     partial  . BREAST LUMPECTOMY  07/13/12   right -DCIS 7 o'clock stage Tis,Nx  . BREAST SURGERY    . CATARACT EXTRACTION    . cyst on face  6 months ago  . [place]   left  . MASS EXCISION  02/18/2012   Procedure: MINOR EXCISION OF MASS;  Surgeon: Adin Hector, MD;  Location: [facility];  Service: General;  Laterality: Left;  excise 2.5 cm. mass left chest wall  . MASTECTOMY  05/14/06   lt lumpectomy-node dissDr.Leone  . PARTIAL HYSTERECTOMY      Allergies  Allergen Reactions  . Neosporin [Neomycin-Polymyxin B Gu] Itching and Rash  . Codeine Nausea Only  . Irbesartan Hives    Avapro  . Adhesive [Tape] Itching  and Rash    To paper tape only. Rash and itching only where applied.      Medication List       Accurate as of 08/12/16  2:44 PM. Always use your most recent med list.          acetaminophen 325 MG tablet Commonly known as:  TYLENOL Take 2 tablets (650 mg total) by mouth every 6 (six) hours as needed for mild pain (or Fever >/= 101).   albuterol (2.5 MG/3ML) 0.083% nebulizer solution Commonly known as:  PROVENTIL Take 2.5 mg by nebulization every 6 (six) hours as needed for wheezing or shortness of breath.   albuterol 108 (90 Base) MCG/ACT inhaler Commonly known as:  PROVENTIL  HFA Inhale 2 puffs into the lungs every 6 (six) hours as needed.   ALPRAZolam 0.25 MG tablet Commonly known as:  XANAX Take 0.25 mg by mouth daily. Give at 2 pm   ALPRAZolam 0.5 MG tablet Commonly known as:  XANAX Take 0.5 mg by mouth daily. Give at 6 am and 10 pm   amLODipine 5 MG tablet Commonly known as:  NORVASC TAKE 1 TABLET (5 MG TOTAL) BY MOUTH DAILY.   aspirin 81 MG chewable tablet Chew 81 mg by mouth daily.   atorvastatin 10 MG tablet Commonly known as:  LIPITOR TAKE 1 TABLET (10 MG TOTAL) BY MOUTH DAILY.   budesonide-formoterol 160-4.5 MCG/ACT inhaler Commonly known as:  SYMBICORT Inhale 2 puffs into the lungs 2 (two) times daily.   docusate sodium 100 MG capsule Commonly known as:  COLACE Take 100 mg by mouth daily. For constipation   guaiFENesin 600 MG 12 hr tablet Commonly known as:  MUCINEX Take 600 mg by mouth 2 (two) times daily.   HYDROcodone-homatropine 5-1.5 MG/5ML syrup Commonly known as:  HYCODAN Take 5 mLs by mouth every 6 (six) hours as needed for cough.   loratadine 10 MG tablet Commonly known as:  CLARITIN Take 10 mg by mouth daily.   omeprazole 20 MG capsule Commonly known as:  PRILOSEC Take 1 capsule (20 mg total) by mouth daily.   ondansetron 4 MG tablet Commonly known as:  ZOFRAN Take 1 tablet (4 mg total) by mouth every 6 (six) hours as needed for nausea.   OXYGEN Inhale 2 L into the lungs as needed (with exertion to maintain SaO2 at >/ 88%).   ramipril 10 MG capsule Commonly known as:  ALTACE TAKE 1 CAPSULE (10 MG TOTAL) BY MOUTH DAILY.   simethicone 80 MG chewable tablet Commonly known as:  MYLICON Chew 80 mg by mouth every 6 (six) hours as needed for flatulence.   tiotropium 18 MCG inhalation capsule Commonly known as:  SPIRIVA HANDIHALER PLACE 1 CAPSULE (18 MCG TOTAL) INTO INHALER AND INHALE DAILY.   UNABLE TO FIND Med Name: House Shake  With lunch and dinner per resident request   Vitamin D3 2000 units Tabs Take 1  tablet by mouth daily at 2 PM.       Review of Systems  Immunization History  Administered Date(s) Administered  . Influenza Split 07/15/2011, 06/03/2012  . Influenza Whole 11/21/2005, 06/24/2007, 06/15/2008, 07/12/2009, 06/13/2010  . Influenza,inj,Quad PF,36+ Mos 06/23/2013, 06/16/2014, 07/25/2015, 06/21/2016  . Influenza-Unspecified 11/10/2015  . PPD Test 11/14/2015, 11/30/2015  . Pneumococcal Conjugate-13 09/10/2013  . Pneumococcal Polysaccharide-23 09/24/2003  . Pneumococcal-Unspecified 11/10/2015  . Td 09/23/2002   Pertinent  Health Maintenance Due  Topic Date Due  . DEXA SCAN  09/23/2016 (Originally 06/18/1997)  . INFLUENZA VACCINE  Completed  . PNA vac Low Risk Adult  Completed   Fall Risk  05/17/2015 03/28/2015 07/26/2014 06/16/2014 11/23/2013  Falls in the past year? Yes No No No No  Number falls in past yr: 1 - - - -  Injury with Fall? Yes - - - -  Risk for fall due to : - - - - Other (Comment)   Functional Status Survey:    Vitals:   08/12/16 1100  BP: 110/65  Pulse: 76  Resp: 16  Temp: 98.1 F (36.7 C)  SpO2: 95%  Weight: 136 lb 6.4 oz (61.9 kg)  Height: 5\' 5"  (1.651 m)   Body mass index is 22.7 kg/m. Physical Exam  Labs reviewed:  Recent Labs  11/07/15 1351 11/08/15 0648 11/09/15 0347 11/15/15 11/20/15 04/03/16  NA 137 142 144 145 143 140  K 3.5 3.8 3.9 4.0 4.5 4.7  CL 99* 107 106  --   --   --   CO2 28 27 32  --   --   --   GLUCOSE 127* 154* 154*  --   --   --   BUN 35* 20 16 13 14 13   CREATININE 1.08* 0.67 0.52 0.5 0.5 0.5  CALCIUM 8.7* 8.7* 8.6*  --   --   --     Recent Labs  11/07/15 1351 11/08/15 0648 11/15/15 11/16/15  AST 30 27 34 30  ALT 16 15 32 34  ALKPHOS 69 66 58 64  BILITOT 0.5 0.2*  --   --   PROT 7.1 6.5  --   --   ALBUMIN 2.9* 2.5*  --   --     Recent Labs  11/07/15 1351 11/08/15 0648 11/09/15 0347 11/15/15 04/03/16  WBC 8.0 5.1 6.2 8.6 5.0  NEUTROABS 7.0  --   --   --   --   HGB 9.7* 10.3* 10.5* 10.1* 10.5*    HCT 29.5* 33.0* 34.2* 32* 35*  MCV 87.8 90.7 92.9  --   --   PLT 206 225 230 360 193   Lab Results  Component Value Date   TSH 2.96 04/03/2016   Lab Results  Component Value Date   HGBA1C 5.8 04/03/2016   Lab Results  Component Value Date   CHOL 138 04/03/2016   HDL 59 04/03/2016   LDLCALC 66 04/03/2016   TRIG 67 04/03/2016   CHOLHDL 3 05/17/2015   Assessment/Plan 1. Pulmonary emphysema, unspecified emphysema type ([place]) Afebrile. No increased in cough or sputum productions. Seen by Pulmonologist 07/22/2016. Continue on Spiriva,albuterol and Symbicort. Continue oxygen 2 Liters [place].   2. Brittle nails Request Biotin or collagen to prevent nail brittleness and make nail stronger. Discussed with MD use of Biotin for nail brittleness. Will monitor for now.Obtain CBC, Vitamin B12 level and TSH level 08/13/2016.   3. Hyperlipidemia LDL goal <100 Continue on atorvastatin. Lipid panel 08/13/2016   4. Anxiety Stable. Continue on Alprazolam.   5. Gastroesophageal reflux disease without esophagitis Continue on Omeprazole.     Family/ staff Communication: Reviewed plan of care with patient and facility Nurse supervisor.   Labs/tests ordered: CBC, BMP, TSH, Lipid panel 08/13/2016

## 2016-08-14 LAB — CBC AND DIFFERENTIAL
HEMATOCRIT: 37 % (ref 36–46)
Hemoglobin: 11.4 g/dL — AB (ref 12.0–16.0)
NEUTROS ABS: 63 /uL
Platelets: 240 10*3/uL (ref 150–399)
WBC: 7.1 10*3/mL

## 2016-08-14 LAB — BASIC METABOLIC PANEL
BUN: 11 mg/dL (ref 4–21)
Creatinine: 0.5 mg/dL (ref 0.5–1.1)
GLUCOSE: 87 mg/dL
POTASSIUM: 4.1 mmol/L (ref 3.4–5.3)
SODIUM: 143 mmol/L (ref 137–147)

## 2016-08-14 LAB — LIPID PANEL
Cholesterol: 149 mg/dL (ref 0–200)
HDL: 63 mg/dL (ref 35–70)
LDL CALC: 69 mg/dL
TRIGLYCERIDES: 86 mg/dL (ref 40–160)

## 2016-08-14 LAB — TSH: TSH: 3.14 u[IU]/mL (ref 0.41–5.90)

## 2016-09-06 ENCOUNTER — Non-Acute Institutional Stay (SKILLED_NURSING_FACILITY): Payer: Medicare Other | Admitting: Family

## 2016-09-06 DIAGNOSIS — N3001 Acute cystitis with hematuria: Secondary | ICD-10-CM

## 2016-09-06 MED ORDER — CIPROFLOXACIN HCL 500 MG PO TABS: 500.0000 mg | ORAL_TABLET | Freq: Two times a day (BID) | ORAL | 0 refills | Status: AC

## 2016-09-06 MED ORDER — SACCHAROMYCES BOULARDII 250 MG PO CAPS: 250.0000 mg | ORAL_CAPSULE | Freq: Two times a day (BID) | ORAL | 0 refills | Status: AC

## 2016-09-06 NOTE — Progress Notes (Signed)
Location:  [facility] Room Number: [place] of Service:  SNF (31) Provider:  Leinaala Catanese FNP-C   Blanchie Serve, MD  Patient Care Team: Blanchie Serve, MD as PCP - General (Internal Medicine)  Extended Emergency Contact Information Primary Emergency Contact: McDaniel,Cathy Address: [address]          [place], [place] [address] [place] Phone: [phone] Mobile Phone: [phone] Relation: Sister Secondary Emergency Contact: Sheilah Pigeon [place] Phone: [phone] Relation: Son  Code Status: Full Code  Goals of care: Advanced Directive information Advanced Directives 04/09/2016  Does Patient Have a Medical Advance Directive? No  Type of Advance Directive -  Does patient want to make changes to medical advance directive? -  Copy of [place] in Chart? -  Would patient like information on creating a medical advance directive? Yes - Environmental education officer Complaint  Patient presents with  . Acute Visit    abnormal lab results    HPI:  Pt is a 82 y.o. female seen today at [facility] for an acute visit for evaluation of abnormal lab results. She is seen in her room today. She complains lower abdominal pain and burning with urination. She denies any fever, chills, Nausea or vomiting but states " I don't feel good today".Recent Urine analysis results showed moderate blood, Nitrites positive with moderate leukocytes. Urine cultures results pending.Will initiate Cipro while awaiting cultures then change if not sensitive to cipro.   Past Medical History:  Diagnosis Date  . Allergy   . Anxiety   . Arthritis   . Breast cancer ([place]) 04/11/06 bx   left breast invasive cductal ca  . 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 Cancer ([place])   . Cataract    extraction    . Depression   . Emphysema   . Emphysema of lung ([place])   . Esophageal reflux   . Full dentures   . History of radiation therapy 07/01/06-08/13/06   left breast  total dose 6100 cGy  . Hx of radiation therapy 08/31/12-09/28/12   right breast   . On home oxygen therapy    at night  . Osteoporosis, unspecified   . Other and unspecified hyperlipidemia   . Other emphysema ([place])   . PONV (postoperative nausea and vomiting)   . Unspecified asthma(493.90)   . Unspecified essential hypertension   . Wears glasses    Past Surgical History:  Procedure Laterality Date  . ABDOMINAL HYSTERECTOMY     partial  . BREAST LUMPECTOMY  07/13/12   right -DCIS 7 o'clock stage Tis,Nx  . BREAST SURGERY    . CATARACT EXTRACTION    . cyst on face  6 months ago  . [place]   left  . MASS EXCISION  02/18/2012   Procedure: MINOR EXCISION OF MASS;  Surgeon: Adin Hector, MD;  Location: [facility];  Service: General;  Laterality: Left;  excise 2.5 cm. mass left chest wall  . MASTECTOMY  05/14/06   lt lumpectomy-node dissDr.Leone  . PARTIAL HYSTERECTOMY      Allergies  Allergen Reactions  . Neosporin [Neomycin-Polymyxin B Gu] Itching and Rash  . Codeine Nausea Only  . Irbesartan Hives    Avapro  . Adhesive [Tape] Itching and Rash    To  paper tape only. Rash and itching only where applied.    Allergies as of 09/06/2016      Reactions   Neosporin [neomycin-polymyxin B Gu] Itching, Rash   Codeine Nausea Only   Irbesartan Hives   Avapro   Adhesive [tape] Itching, Rash   To paper tape only. Rash and itching only where applied.      Medication List       Accurate as of 09/06/16  3:39 PM. Always use your most recent med list.          acetaminophen 325 MG tablet Commonly known as:  TYLENOL Take 2 tablets (650 mg total) by mouth every 6 (six) hours as needed for mild pain (or Fever >/= 101).   albuterol (2.5 MG/3ML) 0.083% nebulizer solution Commonly known as:   PROVENTIL Take 2.5 mg by nebulization every 6 (six) hours as needed for wheezing or shortness of breath.   albuterol 108 (90 Base) MCG/ACT inhaler Commonly known as:  PROVENTIL HFA Inhale 2 puffs into the lungs every 6 (six) hours as needed.   ALPRAZolam 0.25 MG tablet Commonly known as:  XANAX Take 0.25 mg by mouth daily. Give at 2 pm   ALPRAZolam 0.5 MG tablet Commonly known as:  XANAX Take 0.5 mg by mouth daily. Give at 6 am and 10 pm   amLODipine 5 MG tablet Commonly known as:  NORVASC TAKE 1 TABLET (5 MG TOTAL) BY MOUTH DAILY.   aspirin 81 MG chewable tablet Chew 81 mg by mouth daily.   atorvastatin 10 MG tablet Commonly known as:  LIPITOR TAKE 1 TABLET (10 MG TOTAL) BY MOUTH DAILY.   budesonide-formoterol 160-4.5 MCG/ACT inhaler Commonly known as:  SYMBICORT Inhale 2 puffs into the lungs 2 (two) times daily.   ciprofloxacin 500 MG tablet Commonly known as:  CIPRO Take 1 tablet (500 mg total) by mouth 2 (two) times daily.   docusate sodium 100 MG capsule Commonly known as:  COLACE Take 100 mg by mouth daily. For constipation   guaiFENesin 600 MG 12 hr tablet Commonly known as:  MUCINEX Take 600 mg by mouth 2 (two) times daily.   HYDROcodone-homatropine 5-1.5 MG/5ML syrup Commonly known as:  HYCODAN Take 5 mLs by mouth every 6 (six) hours as needed for cough.   loratadine 10 MG tablet Commonly known as:  CLARITIN Take 10 mg by mouth daily.   omeprazole 20 MG capsule Commonly known as:  PRILOSEC Take 1 capsule (20 mg total) by mouth daily.   ondansetron 4 MG tablet Commonly known as:  ZOFRAN Take 1 tablet (4 mg total) by mouth every 6 (six) hours as needed for nausea.   OXYGEN Inhale 2 L into the lungs as needed (with exertion to maintain SaO2 at >/ 88%).   ramipril 10 MG capsule Commonly known as:  ALTACE TAKE 1 CAPSULE (10 MG TOTAL) BY MOUTH DAILY.   saccharomyces boulardii 250 MG capsule Commonly known as:  FLORASTOR Take 1 capsule (250 mg  total) by mouth 2 (two) times daily.   simethicone 80 MG chewable tablet Commonly known as:  MYLICON Chew 80 mg by mouth every 6 (six) hours as needed for flatulence.   tiotropium 18 MCG inhalation capsule Commonly known as:  SPIRIVA HANDIHALER PLACE 1 CAPSULE (18 MCG TOTAL) INTO INHALER AND INHALE DAILY.   UNABLE TO FIND Med Name: House Shake  With lunch and dinner per resident request   Vitamin D3 2000 units Tabs Take 1 tablet by mouth daily at 2  PM.       Review of Systems  Constitutional: Negative for activity change, appetite change, chills, fatigue and fever.  HENT: Negative for congestion, rhinorrhea, sinus pressure, sneezing and sore throat.   Eyes: Negative.   Respiratory: Negative for cough, chest tightness and shortness of breath.        Continuous oxygen   Cardiovascular: Negative for chest pain, palpitations and leg swelling.  Gastrointestinal: Positive for abdominal pain. Negative for abdominal distention, constipation, diarrhea, nausea and vomiting.  Genitourinary: Negative for dysuria, frequency and urgency.  Musculoskeletal: Positive for gait problem.  Skin: Negative for color change, pallor and rash.  Neurological: Negative for dizziness, syncope, light-headedness and headaches.  Hematological: Does not bruise/bleed easily.  Psychiatric/Behavioral: Negative for agitation, confusion, hallucinations and sleep disturbance. The patient is not nervous/anxious.     Immunization History  Administered Date(s) Administered  . Influenza Split 07/15/2011, 06/03/2012  . Influenza Whole 11/21/2005, 06/24/2007, 06/15/2008, 07/12/2009, 06/13/2010  . Influenza,inj,Quad PF,36+ Mos 06/23/2013, 06/16/2014, 07/25/2015, 06/21/2016  . Influenza-Unspecified 11/10/2015  . PPD Test 11/14/2015, 11/30/2015  . Pneumococcal Conjugate-13 09/10/2013  . Pneumococcal Polysaccharide-23 09/24/2003  . Pneumococcal-Unspecified 11/10/2015  . Td 09/23/2002   Pertinent  Health Maintenance  Due  Topic Date Due  . DEXA SCAN  09/23/2016 (Originally 06/18/1997)  . INFLUENZA VACCINE  Completed  . PNA vac Low Risk Adult  Completed   Fall Risk  05/17/2015 03/28/2015 07/26/2014 06/16/2014 11/23/2013  Falls in the past year? Yes No No No No  Number falls in past yr: 1 - - - -  Injury with Fall? Yes - - - -  Risk for fall due to : - - - - Other (Comment)      Vitals:   09/06/16 1430  BP: (!) 144/74  Pulse: 80  Resp: 18  Temp: 97.9 F (36.6 C)  SpO2: 96%  Weight: 136 lb (61.7 kg)  Height: 5\' 5"  (1.651 m)   Body mass index is 22.63 kg/m. Physical Exam  Constitutional: She is oriented to person, place, and time. She appears well-developed and well-nourished. No distress.  Pleasant elderly in no acute distress.   HENT:  Head: Normocephalic.  Mouth/Throat: Oropharynx is clear and moist. No oropharyngeal exudate.  HOH  Eyes: Conjunctivae and EOM are normal. Pupils are equal, round, and reactive to light. Right eye exhibits no discharge. Left eye exhibits no discharge. No scleral icterus.  Neck: Normal range of motion. Neck supple. No JVD present. No thyromegaly present.  Cardiovascular: Normal rate, regular rhythm and intact distal pulses.  Exam reveals no gallop and no friction rub.   No murmur heard. Pulmonary/Chest: Effort normal and breath sounds normal. No respiratory distress. She has no rales.   Oxygen 2 liters via [place] in place   Abdominal: Soft. Bowel sounds are normal. She exhibits no distension. There is no tenderness. There is no rebound and no guarding.  Genitourinary:  Genitourinary Comments: Continent for both B/B   Musculoskeletal: She exhibits no edema or tenderness.  Unsteady gait. Self propel on Wheelchair   Lymphadenopathy:    She has no cervical adenopathy.  Neurological: She is oriented to person, place, and time.  Skin: Skin is warm and dry. No rash noted. No erythema. No pallor.  Psychiatric: She has a normal mood and affect.    Labs  reviewed:  Recent Labs  11/07/15 1351 11/08/15 0648 11/09/15 0347 11/15/15 11/20/15 04/03/16  NA 137 142 144 145 143 140  K 3.5 3.8 3.9 4.0 4.5 4.7  CL  99* 107 106  --   --   --   CO2 28 27 32  --   --   --   GLUCOSE 127* 154* 154*  --   --   --   BUN 35* 20 16 13 14 13   CREATININE 1.08* 0.67 0.52 0.5 0.5 0.5  CALCIUM 8.7* 8.7* 8.6*  --   --   --     Recent Labs  11/07/15 1351 11/08/15 0648 11/15/15 11/16/15  AST 30 27 34 30  ALT 16 15 32 34  ALKPHOS 69 66 58 64  BILITOT 0.5 0.2*  --   --   PROT 7.1 6.5  --   --   ALBUMIN 2.9* 2.5*  --   --     Recent Labs  11/07/15 1351 11/08/15 0648 11/09/15 0347 11/15/15 04/03/16  WBC 8.0 5.1 6.2 8.6 5.0  NEUTROABS 7.0  --   --   --   --   HGB 9.7* 10.3* 10.5* 10.1* 10.5*  HCT 29.5* 33.0* 34.2* 32* 35*  MCV 87.8 90.7 92.9  --   --   PLT 206 225 230 360 193   Lab Results  Component Value Date   TSH 2.96 04/03/2016   Lab Results  Component Value Date   HGBA1C 5.8 04/03/2016   Lab Results  Component Value Date   CHOL 138 04/03/2016   HDL 59 04/03/2016   LDLCALC 66 04/03/2016   TRIG 67 04/03/2016   CHOLHDL 3 05/17/2015   Assessment/Plan  Acute cystitis with hematuria Afebrile. Symptomatic and not feeling well this visit. Urine analysis results showed moderate blood, Nitrites positive with moderate leukocytes. Urine cultures results pending.Will initiate Cipro 500 mg tablet twice daily x 7 days  while awaiting cultures then change if not sensitive to cipro. Add Florastor 250 mg capsule for prophylaxis. Continue to monitor.     Family/ staff Communication: Reviewed plan with patient and Nurse Supervisor.   Labs/tests ordered: none

## 2016-09-11 ENCOUNTER — Non-Acute Institutional Stay (SKILLED_NURSING_FACILITY): Payer: Medicare Other | Admitting: Family

## 2016-09-11 DIAGNOSIS — J439 Emphysema, unspecified: Secondary | ICD-10-CM | POA: Diagnosis not present

## 2016-09-11 DIAGNOSIS — K219 Gastro-esophageal reflux disease without esophagitis: Secondary | ICD-10-CM

## 2016-09-11 DIAGNOSIS — E785 Hyperlipidemia, unspecified: Secondary | ICD-10-CM | POA: Diagnosis not present

## 2016-09-11 DIAGNOSIS — F419 Anxiety disorder, unspecified: Secondary | ICD-10-CM | POA: Diagnosis not present

## 2016-09-11 DIAGNOSIS — I10 Essential (primary) hypertension: Secondary | ICD-10-CM | POA: Diagnosis not present

## 2016-09-11 NOTE — Progress Notes (Signed)
Location:  [facility] Room Number: [place] of Service:  SNF (31) Provider: Dinah Ngetich FNP-C   Blanchie Serve, MD  Patient Care Team: Blanchie Serve, MD as PCP - General (Internal Medicine)  Extended Emergency Contact Information Primary Emergency Contact: McDaniel,Cathy Address: [address]          [place], [place] [address] [place] Phone: [phone] Mobile Phone: [phone] Relation: Sister Secondary Emergency Contact: Sheilah Pigeon [place] Phone: [phone] Relation: Son  Code Status:  Full code [place] Proffer  Goals of care: Advanced Directive information Advanced Directives 04/09/2016  Does Patient Have a Medical Advance Directive? No  Type of Advance Directive -  Does patient want to make changes to medical advance directive? -  Copy of [place] in Chart? -  Would patient like information on creating a medical advance directive? Yes - Environmental education officer Complaint  Patient presents with  . Medical Management of Chronic Issues    HPI:  Pt is a 82 y.o. female seen today at [facility] for medical management of chronic diseases. She has a medical history of COPD, HTN, GERD, OA, Hyperlipidemia, depression, Anxiety among other conditions. She is seen in her room today. She is currently on Cipro 500 mg Tablet twice daily for recent UTI. She states urinary systopms have improved. She denies any fever or chills. No recent fall episodes or hospital admission. Facility Nurse reports no new concerns.    Past Medical History:  Diagnosis Date  . Allergy   . Anxiety   . Arthritis   . Breast cancer ([place]) 04/11/06 bx   left breast invasive cductal ca  . 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 Cancer ([place])   . Cataract    extraction  . Depression   . Emphysema    . Emphysema of lung ([place])   . Esophageal reflux   . Full dentures   . History of radiation therapy 07/01/06-08/13/06   left breast  total dose 6100 cGy  . Hx of radiation therapy 08/31/12-09/28/12   right breast   . On home oxygen therapy    at night  . Osteoporosis, unspecified   . Other and unspecified hyperlipidemia   . Other emphysema ([place])   . PONV (postoperative nausea and vomiting)   . Unspecified asthma(493.90)   . Unspecified essential hypertension   . Wears glasses    Past Surgical History:  Procedure Laterality Date  . ABDOMINAL HYSTERECTOMY     partial  . BREAST LUMPECTOMY  07/13/12   right -DCIS 7 o'clock stage Tis,Nx  . BREAST SURGERY    . CATARACT EXTRACTION    . cyst on face  6 months ago  . [place]   left  . MASS EXCISION  02/18/2012   Procedure: MINOR EXCISION OF MASS;  Surgeon: Adin Hector, MD;  Location: [facility];  Service: General;  Laterality: Left;  excise 2.5 cm. mass left chest wall  . MASTECTOMY  05/14/06   lt lumpectomy-node dissDr.Leone  . PARTIAL HYSTERECTOMY      Allergies  Allergen Reactions  . Neosporin [Neomycin-Polymyxin B Gu] Itching and Rash  . Codeine Nausea Only  . Irbesartan Hives    Avapro  . Adhesive [Tape] Itching and Rash    To paper tape only. Rash  and itching only where applied.    Allergies as of 09/11/2016      Reactions   Neosporin [neomycin-polymyxin B Gu] Itching, Rash   Codeine Nausea Only   Irbesartan Hives   Avapro   Adhesive [tape] Itching, Rash   To paper tape only. Rash and itching only where applied.      Medication List       Accurate as of 09/11/16  7:07 PM. Always use your most recent med list.          acetaminophen 325 MG tablet Commonly known as:  TYLENOL Take 2 tablets (650 mg total) by mouth every 6 (six) hours as needed for mild pain (or Fever >/= 101).   albuterol (2.5 MG/3ML) 0.083% nebulizer solution Commonly known as:  PROVENTIL Take 2.5 mg by  nebulization every 6 (six) hours as needed for wheezing or shortness of breath.   albuterol 108 (90 Base) MCG/ACT inhaler Commonly known as:  PROVENTIL HFA Inhale 2 puffs into the lungs every 6 (six) hours as needed.   ALPRAZolam 0.25 MG tablet Commonly known as:  XANAX Take 0.25 mg by mouth daily. Give at 2 pm   ALPRAZolam 0.5 MG tablet Commonly known as:  XANAX Take 0.5 mg by mouth daily. Give at 6 am and 10 pm   amLODipine 5 MG tablet Commonly known as:  NORVASC TAKE 1 TABLET (5 MG TOTAL) BY MOUTH DAILY.   aspirin 81 MG chewable tablet Chew 81 mg by mouth daily.   atorvastatin 10 MG tablet Commonly known as:  LIPITOR TAKE 1 TABLET (10 MG TOTAL) BY MOUTH DAILY.   budesonide-formoterol 160-4.5 MCG/ACT inhaler Commonly known as:  SYMBICORT Inhale 2 puffs into the lungs 2 (two) times daily.   ciprofloxacin 500 MG tablet Commonly known as:  CIPRO Take 1 tablet (500 mg total) by mouth 2 (two) times daily.   docusate sodium 100 MG capsule Commonly known as:  COLACE Take 100 mg by mouth daily. For constipation   guaiFENesin 600 MG 12 hr tablet Commonly known as:  MUCINEX Take 600 mg by mouth 2 (two) times daily.   HYDROcodone-homatropine 5-1.5 MG/5ML syrup Commonly known as:  HYCODAN Take 5 mLs by mouth every 6 (six) hours as needed for cough.   loratadine 10 MG tablet Commonly known as:  CLARITIN Take 10 mg by mouth daily.   omeprazole 20 MG capsule Commonly known as:  PRILOSEC Take 1 capsule (20 mg total) by mouth daily.   ondansetron 4 MG tablet Commonly known as:  ZOFRAN Take 1 tablet (4 mg total) by mouth every 6 (six) hours as needed for nausea.   OXYGEN Inhale 2 L into the lungs as needed (with exertion to maintain SaO2 at >/ 88%).   ramipril 10 MG capsule Commonly known as:  ALTACE TAKE 1 CAPSULE (10 MG TOTAL) BY MOUTH DAILY.   saccharomyces boulardii 250 MG capsule Commonly known as:  FLORASTOR Take 1 capsule (250 mg total) by mouth 2 (two) times  daily.   simethicone 80 MG chewable tablet Commonly known as:  MYLICON Chew 80 mg by mouth every 6 (six) hours as needed for flatulence.   tiotropium 18 MCG inhalation capsule Commonly known as:  SPIRIVA HANDIHALER PLACE 1 CAPSULE (18 MCG TOTAL) INTO INHALER AND INHALE DAILY.   UNABLE TO FIND Med Name: House Shake  With lunch and dinner per resident request   Vitamin D3 2000 units Tabs Take 1 tablet by mouth daily at 2 PM.  Review of Systems  Constitutional: Negative for activity change, appetite change, chills, fatigue and fever.  HENT: Negative for congestion, rhinorrhea, sinus pressure, sneezing and sore throat.   Eyes: Negative.   Respiratory: Negative for cough, chest tightness and shortness of breath.        Continuous oxygen   Cardiovascular: Negative for chest pain, palpitations and leg swelling.  Gastrointestinal: Negative for abdominal distention, abdominal pain, constipation, diarrhea, nausea and vomiting.  Endocrine: Negative.   Genitourinary: Negative for dysuria, frequency and urgency.       Previous burning with voiding symptoms has improved on antibiotics.   Musculoskeletal: Positive for gait problem.  Skin: Negative for color change, pallor and rash.  Neurological: Negative for dizziness, syncope, light-headedness and headaches.  Hematological: Does not bruise/bleed easily.  Psychiatric/Behavioral: Negative for agitation, confusion, hallucinations and sleep disturbance. The patient is not nervous/anxious.     Immunization History  Administered Date(s) Administered  . Influenza Split 07/15/2011, 06/03/2012  . Influenza Whole 11/21/2005, 06/24/2007, 06/15/2008, 07/12/2009, 06/13/2010  . Influenza,inj,Quad PF,36+ Mos 06/23/2013, 06/16/2014, 07/25/2015, 06/21/2016  . Influenza-Unspecified 11/10/2015  . PPD Test 11/14/2015, 11/30/2015  . Pneumococcal Conjugate-13 09/10/2013  . Pneumococcal Polysaccharide-23 09/24/2003  . Pneumococcal-Unspecified  11/10/2015  . Td 09/23/2002   Pertinent  Health Maintenance Due  Topic Date Due  . DEXA SCAN  09/23/2016 (Originally 06/18/1997)  . INFLUENZA VACCINE  Completed  . PNA vac Low Risk Adult  Completed   Fall Risk  05/17/2015 03/28/2015 07/26/2014 06/16/2014 11/23/2013  Falls in the past year? Yes No No No No  Number falls in past yr: 1 - - - -  Injury with Fall? Yes - - - -  Risk for fall due to : - - - - Other (Comment)      Vitals:   09/11/16 1300  BP: (!) 145/89  Pulse: 77  Resp: 18  Temp: 98.7 F (37.1 C)  SpO2: 95%  Weight: 136 lb (61.7 kg)  Height: 5\' 5"  (1.651 m)   Body mass index is 22.63 kg/m. Physical Exam  Constitutional: She is oriented to person, place, and time. She appears well-developed and well-nourished. No distress.  Pleasant elderly in no acute distress.   HENT:  Head: Normocephalic.  Mouth/Throat: Oropharynx is clear and moist. No oropharyngeal exudate.  HOH  Eyes: Conjunctivae and EOM are normal. Pupils are equal, round, and reactive to light. Right eye exhibits no discharge. Left eye exhibits no discharge. No scleral icterus.  Neck: Normal range of motion. Neck supple. No JVD present. No thyromegaly present.  Cardiovascular: Normal rate, regular rhythm and intact distal pulses.  Exam reveals no gallop and no friction rub.   No murmur heard. Pulmonary/Chest: Effort normal and breath sounds normal. No respiratory distress. She has no rales.   Oxygen 2 liters via [place] in place   Abdominal: Soft. Bowel sounds are normal. She exhibits no distension. There is no tenderness. There is no rebound and no guarding.  Genitourinary:  Genitourinary Comments: Continent for both B/B   Musculoskeletal: Normal range of motion. She exhibits no edema or tenderness.  Unsteady gait. Self propel on Wheelchair   Lymphadenopathy:    She has no cervical adenopathy.  Neurological: She is oriented to person, place, and time.  Skin: Skin is warm and dry. No rash noted. No erythema. No  pallor.  Skin intact  Psychiatric: She has a normal mood and affect.    Labs reviewed:  Recent Labs  11/07/15 1351 11/08/15 0648 11/09/15 0347  11/20/15 04/03/16  08/14/16  NA 137 142 144  < > 143 140 143  K 3.5 3.8 3.9  < > 4.5 4.7 4.1  CL 99* 107 106  --   --   --   --   CO2 28 27 32  --   --   --   --   GLUCOSE 127* 154* 154*  --   --   --   --   BUN 35* 20 16  < > 14 13 11   CREATININE 1.08* 0.67 0.52  < > 0.5 0.5 0.5  CALCIUM 8.7* 8.7* 8.6*  --   --   --   --   < > = values in this interval not displayed.  Recent Labs  11/07/15 1351 11/08/15 0648 11/15/15 11/16/15  AST 30 27 34 30  ALT 16 15 32 34  ALKPHOS 69 66 58 64  BILITOT 0.5 0.2*  --   --   PROT 7.1 6.5  --   --   ALBUMIN 2.9* 2.5*  --   --     Recent Labs  11/07/15 1351 11/08/15 0648 11/09/15 0347 11/15/15 04/03/16 08/14/16  WBC 8.0 5.1 6.2 8.6 5.0 7.1  NEUTROABS 7.0  --   --   --   --  63  HGB 9.7* 10.3* 10.5* 10.1* 10.5* 11.4*  HCT 29.5* 33.0* 34.2* 32* 35* 37  MCV 87.8 90.7 92.9  --   --   --   PLT 206 225 230 360 193 240   Lab Results  Component Value Date   TSH 3.14 08/14/2016   Lab Results  Component Value Date   HGBA1C 5.8 04/03/2016   Lab Results  Component Value Date   CHOL 149 08/14/2016   HDL 63 08/14/2016   LDLCALC 69 08/14/2016   TRIG 86 08/14/2016   CHOLHDL 3 05/17/2015   Assessment/Plan 1. Pulmonary emphysema, unspecified emphysema type ([place]) Afebrile.Exam findings negative for shortness of breath or worsening wheezing. Continue on Spiriva,albuterol and Symbicort. Continue oxygen 2 Liters [place].   2. Essential hypertension, benign B/p stable. Continue on amlodipine and Ramipril.   3. Hyperlipidemia LDL goal <100 Continue on atorvastatin. Recent Lipid panel results within target goal range. Will reduce Lipitor to 5 mg Tablet. Will discontinue if remains stable. Change Lipitor to 5 mg Tablet at bedtime. Recheck Lipid panel in 3 months.   4. Gastroesophageal reflux disease  without esophagitis Asymptomatic.Continue on Omeprazole.   5. Anxiety Stable. Continue on Alprazolam.    Family/ staff Communication: Reviewed plan of care with patient and facility Nurse supervisor.   Labs/tests ordered:  Lipid panel in 3 months.

## 2016-09-24 ENCOUNTER — Encounter: Payer: Self-pay | Admitting: Family

## 2016-09-24 ENCOUNTER — Non-Acute Institutional Stay (SKILLED_NURSING_FACILITY): Payer: Medicare Other | Admitting: Family

## 2016-09-24 DIAGNOSIS — H6122 Impacted cerumen, left ear: Secondary | ICD-10-CM | POA: Diagnosis not present

## 2016-09-24 MED ORDER — CARBAMIDE PEROXIDE 6.5 % OT SOLN: 5.0000 [drp] | Freq: Two times a day (BID) | OTIC | 0 refills | Status: AC

## 2016-09-24 NOTE — Progress Notes (Signed)
Location:  [place] Room Number: [phone] Place of Service:  SNF (31) Provider:  Dinah Ngetich FNP-C  Blanchie Serve, MD  Patient Care Team: Blanchie Serve, MD as PCP - General (Internal Medicine)  Extended Emergency Contact Information Primary Emergency Contact: McDaniel,Cathy Address: [address]          [place], [place] [address] [place] Phone: [phone] Mobile Phone: [phone] Relation: Sister Secondary Emergency Contact: Sheilah Pigeon [place] Phone: [phone] Relation: Son  Code Status:  Full code Goals of care: Advanced Directive information Advanced Directives 09/24/2016  Does Patient Have a Medical Advance Directive? Yes  Type of Advance Directive Living will  Does patient want to make changes to medical advance directive? -  Copy of [place] in Chart? -  Would patient like information on creating a medical advance directive? -     Chief Complaint  Patient presents with  . Acute Visit    HPI:  Pt is a 83 y.o. female seen today at [facility] for an acute visit for evaluation of decreased hearing on left ear. She denies any fever, chills or URI symptoms.    Past Medical History:  Diagnosis Date  . Allergy   . Anxiety   . Arthritis   . Breast cancer ([place]) 04/11/06 bx   left breast invasive cductal ca  . 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 Cancer ([place])   . Cataract    extraction  . Depression   . Emphysema   . Emphysema of lung ([place])   . Esophageal reflux   . Full dentures   . History of radiation therapy 07/01/06-08/13/06   left breast  total dose 6100 cGy  . Hx of radiation therapy 08/31/12-09/28/12   right breast   . On home oxygen therapy    at night  . Osteoporosis, unspecified   . Other and unspecified hyperlipidemia   . Other emphysema ([place])    . PONV (postoperative nausea and vomiting)   . Unspecified asthma(493.90)   . Unspecified essential hypertension   . Wears glasses    Past Surgical History:  Procedure Laterality Date  . ABDOMINAL HYSTERECTOMY     partial  . BREAST LUMPECTOMY  07/13/12   right -DCIS 7 o'clock stage Tis,Nx  . BREAST SURGERY    . CATARACT EXTRACTION    . cyst on face  6 months ago  . [place]   left  . MASS EXCISION  02/18/2012   Procedure: MINOR EXCISION OF MASS;  Surgeon: Adin Hector, MD;  Location: [facility];  Service: General;  Laterality: Left;  excise 2.5 cm. mass left chest wall  . MASTECTOMY  05/14/06   lt lumpectomy-node dissDr.Leone  . PARTIAL HYSTERECTOMY      Allergies  Allergen Reactions  . Neosporin [Neomycin-Polymyxin B Gu] Itching and Rash  . Codeine Nausea Only  . Irbesartan Hives    Avapro  . Adhesive [Tape] Itching and Rash    To paper tape only. Rash and itching only where applied.    Allergies as of 09/24/2016      Reactions   Neosporin [neomycin-polymyxin B Gu] Itching, Rash   Codeine Nausea Only   Irbesartan Hives   Avapro   Adhesive [tape] Itching, Rash   To paper tape only. Rash and itching only where applied.  Medication List       Accurate as of 09/24/16  4:26 PM. Always use your most recent med list.          acetaminophen 325 MG tablet Commonly known as:  TYLENOL Take 2 tablets (650 mg total) by mouth every 6 (six) hours as needed for mild pain (or Fever >/= 101).   albuterol (2.5 MG/3ML) 0.083% nebulizer solution Commonly known as:  PROVENTIL Take 2.5 mg by nebulization every 6 (six) hours as needed for wheezing or shortness of breath.   albuterol 108 (90 Base) MCG/ACT inhaler Commonly known as:  PROVENTIL HFA Inhale 2 puffs into the lungs every 6 (six) hours as needed.   ALPRAZolam 0.25 MG tablet Commonly known as:  XANAX Take 0.25 mg by mouth daily. Give at 2 pm   ALPRAZolam 0.5 MG tablet Commonly known as:   XANAX Take 0.5 mg by mouth daily. Give at 6 am and 10 pm   amLODipine 5 MG tablet Commonly known as:  NORVASC TAKE 1 TABLET (5 MG TOTAL) BY MOUTH DAILY.   aspirin 81 MG chewable tablet Chew 81 mg by mouth daily.   atorvastatin 10 MG tablet Commonly known as:  LIPITOR TAKE 1 TABLET (10 MG TOTAL) BY MOUTH DAILY.   budesonide-formoterol 160-4.5 MCG/ACT inhaler Commonly known as:  SYMBICORT Inhale 2 puffs into the lungs 2 (two) times daily.   docusate sodium 100 MG capsule Commonly known as:  COLACE Take 100 mg by mouth daily. For constipation   guaiFENesin 600 MG 12 hr tablet Commonly known as:  MUCINEX Take 600 mg by mouth 2 (two) times daily.   HYDROcodone-homatropine 5-1.5 MG/5ML syrup Commonly known as:  HYCODAN Take 5 mLs by mouth every 6 (six) hours as needed for cough.   loratadine 10 MG tablet Commonly known as:  CLARITIN Take 10 mg by mouth daily.   omeprazole 20 MG capsule Commonly known as:  PRILOSEC Take 1 capsule (20 mg total) by mouth daily.   ondansetron 4 MG tablet Commonly known as:  ZOFRAN Take 1 tablet (4 mg total) by mouth every 6 (six) hours as needed for nausea.   OXYGEN Inhale 2 L into the lungs as needed (with exertion to maintain SaO2 at >/ 88%).   ramipril 10 MG capsule Commonly known as:  ALTACE TAKE 1 CAPSULE (10 MG TOTAL) BY MOUTH DAILY.   simethicone 80 MG chewable tablet Commonly known as:  MYLICON Chew 80 mg by mouth every 6 (six) hours as needed for flatulence.   tiotropium 18 MCG inhalation capsule Commonly known as:  SPIRIVA HANDIHALER PLACE 1 CAPSULE (18 MCG TOTAL) INTO INHALER AND INHALE DAILY.   UNABLE TO FIND Med Name: House Shake  With lunch and dinner per resident request   Vitamin D3 2000 units Tabs Take 1 tablet by mouth daily at 2 PM.       Review of Systems  Constitutional: Negative for activity change, appetite change, chills, fatigue and fever.  HENT: Negative for congestion, rhinorrhea, sinus pressure,  sneezing and sore throat.        Decreased hearing to left ear  Eyes: Negative.   Respiratory: Negative for cough, chest tightness and shortness of breath.        Continuous oxygen   Cardiovascular: Negative for chest pain, palpitations and leg swelling.  Musculoskeletal: Positive for gait problem.  Skin: Negative for color change, pallor and rash.  Neurological: Negative for dizziness, syncope, light-headedness and headaches.  Psychiatric/Behavioral: Negative for agitation, confusion, hallucinations  and sleep disturbance. The patient is not nervous/anxious.     Immunization History  Administered Date(s) Administered  . Influenza Split 07/15/2011, 06/03/2012  . Influenza Whole 11/21/2005, 06/24/2007, 06/15/2008, 07/12/2009, 06/13/2010  . Influenza,inj,Quad PF,36+ Mos 06/23/2013, 06/16/2014, 07/25/2015, 06/21/2016  . Influenza-Unspecified 11/10/2015  . PPD Test 11/14/2015, 11/30/2015  . Pneumococcal Conjugate-13 09/10/2013  . Pneumococcal Polysaccharide-23 09/24/2003  . Pneumococcal-Unspecified 11/10/2015  . Td 09/23/2002   Pertinent  Health Maintenance Due  Topic Date Due  . DEXA SCAN  06/18/1997  . INFLUENZA VACCINE  Completed  . PNA vac Low Risk Adult  Completed   Fall Risk  05/17/2015 03/28/2015 07/26/2014 06/16/2014 11/23/2013  Falls in the past year? Yes No No No No  Number falls in past yr: 1 - - - -  Injury with Fall? Yes - - - -  Risk for fall due to : - - - - Other (Comment)    Vitals:   09/24/16 1620  BP: (!) 126/58  Pulse: 74  Resp: 20  Temp: 97.1 F (36.2 C)  SpO2: 94%  Weight: 136 lb (61.7 kg)  Height: 5\' 5"  (1.651 m)   Body mass index is 22.63 kg/m. Physical Exam  Constitutional: She is oriented to person, place, and time. She appears well-developed and well-nourished. No distress.  Pleasant elderly in no acute distress.   HENT:  Head: Normocephalic.  Mouth/Throat: Oropharynx is clear and moist. No oropharyngeal exudate.  HOH left ear cerumen impaction.    Eyes: Conjunctivae and EOM are normal. Pupils are equal, round, and reactive to light. Right eye exhibits no discharge. Left eye exhibits no discharge. No scleral icterus.  Neck: Normal range of motion. Neck supple. No JVD present. No thyromegaly present.  Cardiovascular: Normal rate, regular rhythm and intact distal pulses.  Exam reveals no gallop and no friction rub.   No murmur heard. Pulmonary/Chest: Effort normal and breath sounds normal. No respiratory distress. She has no rales.   Oxygen 2 liters via [place] in place   Abdominal: Soft. Bowel sounds are normal. She exhibits no distension. There is no tenderness. There is no rebound and no guarding.  Musculoskeletal: Normal range of motion. She exhibits no edema or tenderness.  Unsteady gait. Self propel on Wheelchair   Lymphadenopathy:    She has no cervical adenopathy.  Neurological: She is oriented to person, place, and time.  Skin: Skin is warm and dry. No rash noted. No erythema. No pallor.  Skin intact  Psychiatric: She has a normal mood and affect.    Labs reviewed:  Recent Labs  11/07/15 1351 11/08/15 0648 11/09/15 0347  11/20/15 04/03/16 08/14/16  NA 137 142 144  < > 143 140 143  K 3.5 3.8 3.9  < > 4.5 4.7 4.1  CL 99* 107 106  --   --   --   --   CO2 28 27 32  --   --   --   --   GLUCOSE 127* 154* 154*  --   --   --   --   BUN 35* 20 16  < > 14 13 11   CREATININE 1.08* 0.67 0.52  < > 0.5 0.5 0.5  CALCIUM 8.7* 8.7* 8.6*  --   --   --   --   < > = values in this interval not displayed.  Recent Labs  11/07/15 1351 11/08/15 0648 11/15/15 11/16/15  AST 30 27 34 30  ALT 16 15 32 34  ALKPHOS 69 66 58 64  BILITOT 0.5 0.2*  --   --   PROT 7.1 6.5  --   --   ALBUMIN 2.9* 2.5*  --   --     Recent Labs  11/07/15 1351 11/08/15 0648 11/09/15 0347 11/15/15 04/03/16 08/14/16  WBC 8.0 5.1 6.2 8.6 5.0 7.1  NEUTROABS 7.0  --   --   --   --  63  HGB 9.7* 10.3* 10.5* 10.1* 10.5* 11.4*  HCT 29.5* 33.0* 34.2* 32* 35* 37  MCV  87.8 90.7 92.9  --   --   --   PLT 206 225 230 360 193 240   Lab Results  Component Value Date   TSH 3.14 08/14/2016   Lab Results  Component Value Date   HGBA1C 5.8 04/03/2016   Lab Results  Component Value Date   CHOL 149 08/14/2016   HDL 63 08/14/2016   LDLCALC 69 08/14/2016   TRIG 86 08/14/2016   CHOLHDL 3 05/17/2015    Assessment/Plan  Cerumen impaction  Left ear impaction noted. Debrox 6.5 %otic soln instil 5 gtts to left ear twice daily x 5 days. Then facility Nurse to lavage with warm water.    Family/ staff Communication: Reviewed plan of care with patient and facility Nurse supervisor.   Labs/tests ordered: None

## 2016-10-07 ENCOUNTER — Non-Acute Institutional Stay (SKILLED_NURSING_FACILITY): Payer: Medicare Other | Admitting: Family

## 2016-10-07 DIAGNOSIS — E785 Hyperlipidemia, unspecified: Secondary | ICD-10-CM

## 2016-10-07 DIAGNOSIS — J439 Emphysema, unspecified: Secondary | ICD-10-CM

## 2016-10-07 DIAGNOSIS — H6122 Impacted cerumen, left ear: Secondary | ICD-10-CM

## 2016-10-07 DIAGNOSIS — I10 Essential (primary) hypertension: Secondary | ICD-10-CM | POA: Diagnosis not present

## 2016-10-07 NOTE — Progress Notes (Signed)
Location:  [place] [facility] Room Number: [place] of Service:  SNF (31) Provider: Rose-Marie Hickling FNP-C   Blanchie Serve, MD  Patient Care Team: Blanchie Serve, MD as PCP - General (Internal Medicine)  Extended Emergency Contact Information Primary Emergency Contact: McDaniel,Cathy Address: [address]          [place], [place] [address] [place] Phone: [phone] Mobile Phone: [phone] Relation: Sister Secondary Emergency Contact: Sheilah Pigeon [place] of [place] Phone: [phone] Relation: Son  Code Status:  Full code [place]  Goals of care: Advanced Directive information Advanced Directives 09/24/2016  Does Patient Have a Medical Advance Directive? Yes  Type of Advance Directive Living will  Does patient want to make changes to medical advance directive? -  Copy of [place] in Chart? -  Would patient like information on creating a medical advance directive? -     Chief Complaint  Patient presents with  . Medical Management of Chronic Issues    HPI:  Pt is a 83 y.o. female seen today at [facility] for medical management of chronic diseases. She has a medical history of COPD, HTN, GERD, OA, Hyperlipidemia, depression, Anxiety among other conditions. She is seen in her room today.She recently had lavage to left ear for cerumen impaction would like ear re-evaluated for clearance of cerumen.She denies any fever, chills or hearing loss.She continue to self propel on wheelchair on facility hallway. she has had no recent fall episodes, weight changes or hospital admission since prior visit. Facility Nurse reports no new concerns.    Past Medical History:  Diagnosis Date  . Allergy   . Anxiety   . Arthritis   . Breast cancer ([place]) 04/11/06 bx   left breast invasive cductal ca  . Breast cancer ([place]) 07/01/12 bx   right breast,low grade ductal carcinoma in situ w/assoc  calcification,ER/PR=+,  . Cancer ([place])   . Cataract    extraction  . Depression   . Emphysema   . Emphysema of lung ([place])   . Esophageal reflux   . Full dentures   . History of radiation therapy 07/01/06-08/13/06   left breast  total dose 6100 cGy  . Hx of radiation therapy 08/31/12-09/28/12   right breast   . On home oxygen therapy    at night  . Osteoporosis, unspecified   . Other and unspecified hyperlipidemia   . Other emphysema ([place])   . PONV (postoperative nausea and vomiting)   . Unspecified asthma(493.90)   . Unspecified essential hypertension   . Wears glasses    Past Surgical History:  Procedure Laterality Date  . ABDOMINAL HYSTERECTOMY     partial  . BREAST LUMPECTOMY  07/13/12   right -DCIS 7 o'clock stage Tis,Nx  . BREAST SURGERY    . CATARACT EXTRACTION    . cyst on face  6 months ago  . [place]   left  . MASS EXCISION  02/18/2012   Procedure: MINOR EXCISION OF MASS;  Surgeon: Adin Hector, MD;  Location: [facility];  Service: General;  Laterality: Left;  excise 2.5 cm. mass left chest wall  . MASTECTOMY  05/14/06   lt lumpectomy-node dissDr.Leone  . PARTIAL HYSTERECTOMY      Allergies  Allergen Reactions  . Neosporin [Neomycin-Polymyxin B Gu] Itching and Rash  . Codeine Nausea Only  . Irbesartan Hives    Avapro  .  Adhesive [Tape] Itching and Rash    To paper tape only. Rash and itching only where applied.    Allergies as of 10/07/2016      Reactions   Neosporin [neomycin-polymyxin B Gu] Itching, Rash   Codeine Nausea Only   Irbesartan Hives   Avapro   Adhesive [tape] Itching, Rash   To paper tape only. Rash and itching only where applied.      Medication List       Accurate as of 10/07/16  3:47 PM. Always use your most recent med list.          acetaminophen 325 MG tablet Commonly known as:  TYLENOL Take 2 tablets (650 mg total) by mouth every 6 (six) hours as needed for mild pain (or Fever >/= 101).     albuterol (2.5 MG/3ML) 0.083% nebulizer solution Commonly known as:  PROVENTIL Take 2.5 mg by nebulization every 6 (six) hours as needed for wheezing or shortness of breath.   albuterol 108 (90 Base) MCG/ACT inhaler Commonly known as:  PROVENTIL HFA Inhale 2 puffs into the lungs every 6 (six) hours as needed.   ALPRAZolam 0.25 MG tablet Commonly known as:  XANAX Take 0.25 mg by mouth daily. Give at 2 pm   ALPRAZolam 0.5 MG tablet Commonly known as:  XANAX Take 0.5 mg by mouth daily. Give at 6 am and 10 pm   amLODipine 5 MG tablet Commonly known as:  NORVASC TAKE 1 TABLET (5 MG TOTAL) BY MOUTH DAILY.   aspirin 81 MG chewable tablet Chew 81 mg by mouth daily.   atorvastatin 10 MG tablet Commonly known as:  LIPITOR TAKE 1 TABLET (10 MG TOTAL) BY MOUTH DAILY.   budesonide-formoterol 160-4.5 MCG/ACT inhaler Commonly known as:  SYMBICORT Inhale 2 puffs into the lungs 2 (two) times daily.   docusate sodium 100 MG capsule Commonly known as:  COLACE Take 100 mg by mouth daily. For constipation   guaiFENesin 600 MG 12 hr tablet Commonly known as:  MUCINEX Take 600 mg by mouth 2 (two) times daily.   HYDROcodone-homatropine 5-1.5 MG/5ML syrup Commonly known as:  HYCODAN Take 5 mLs by mouth every 6 (six) hours as needed for cough.   loratadine 10 MG tablet Commonly known as:  CLARITIN Take 10 mg by mouth daily.   omeprazole 20 MG capsule Commonly known as:  PRILOSEC Take 1 capsule (20 mg total) by mouth daily.   ondansetron 4 MG tablet Commonly known as:  ZOFRAN Take 1 tablet (4 mg total) by mouth every 6 (six) hours as needed for nausea.   OXYGEN Inhale 2 L into the lungs as needed (with exertion to maintain SaO2 at >/ 88%).   ramipril 10 MG capsule Commonly known as:  ALTACE TAKE 1 CAPSULE (10 MG TOTAL) BY MOUTH DAILY.   simethicone 80 MG chewable tablet Commonly known as:  MYLICON Chew 80 mg by mouth every 6 (six) hours as needed for flatulence.   tiotropium  18 MCG inhalation capsule Commonly known as:  SPIRIVA HANDIHALER PLACE 1 CAPSULE (18 MCG TOTAL) INTO INHALER AND INHALE DAILY.   UNABLE TO FIND Med Name: House Shake  With lunch and dinner per resident request   Vitamin D3 2000 units Tabs Take 1 tablet by mouth daily at 2 PM.       Review of Systems  Constitutional: Negative for activity change, appetite change, chills, fatigue and fever.  HENT: Negative for congestion, rhinorrhea, sinus pressure, sneezing and sore throat.   Eyes:  Negative.   Respiratory: Negative for cough, chest tightness and shortness of breath.        Continuous oxygen   Cardiovascular: Negative for chest pain, palpitations and leg swelling.  Gastrointestinal: Negative for abdominal distention, abdominal pain, constipation, diarrhea, nausea and vomiting.  Endocrine: Negative for cold intolerance, heat intolerance, polydipsia, polyphagia and polyuria.  Genitourinary: Negative for dysuria, frequency and urgency.  Musculoskeletal: Positive for gait problem.  Skin: Negative for color change, pallor and rash.  Neurological: Negative for dizziness, syncope, light-headedness and headaches.  Hematological: Does not bruise/bleed easily.  Psychiatric/Behavioral: Negative for agitation, confusion, hallucinations and sleep disturbance. The patient is not nervous/anxious.     Immunization History  Administered Date(s) Administered  . Influenza Split 07/15/2011, 06/03/2012  . Influenza Whole 11/21/2005, 06/24/2007, 06/15/2008, 07/12/2009, 06/13/2010  . Influenza,inj,Quad PF,36+ Mos 06/23/2013, 06/16/2014, 07/25/2015, 06/21/2016  . Influenza-Unspecified 11/10/2015  . PPD Test 11/14/2015, 11/30/2015  . Pneumococcal Conjugate-13 09/10/2013  . Pneumococcal Polysaccharide-23 09/24/2003  . Pneumococcal-Unspecified 11/10/2015  . Td 09/23/2002   Pertinent  Health Maintenance Due  Topic Date Due  . DEXA SCAN  06/18/1997  . INFLUENZA VACCINE  Completed  . PNA vac Low Risk  Adult  Completed   Fall Risk  05/17/2015 03/28/2015 07/26/2014 06/16/2014 11/23/2013  Falls in the past year? Yes No No No No  Number falls in past yr: 1 - - - -  Injury with Fall? Yes - - - -  Risk for fall due to : - - - - Other (Comment)      Vitals:   10/07/16 1130  BP: (!) 149/75  Pulse: 78  Resp: 20  Temp: 97.6 F (36.4 C)  SpO2: 97%  Weight: 137 lb 12.8 oz (62.5 kg)  Height: 5\' 5"  (1.651 m)   Body mass index is 22.93 kg/m. Physical Exam  Constitutional: She is oriented to person, place, and time. She appears well-developed and well-nourished. No distress.  Pleasant elderly in no acute distress.   HENT:  Head: Normocephalic.  Mouth/Throat: Oropharynx is clear and moist. No oropharyngeal exudate.  HOH left ear cerumen impaction  Eyes: Conjunctivae and EOM are normal. Pupils are equal, round, and reactive to light. Right eye exhibits no discharge. Left eye exhibits no discharge. No scleral icterus.  Neck: Normal range of motion. Neck supple. No JVD present. No thyromegaly present.  Cardiovascular: Normal rate, regular rhythm and intact distal pulses.  Exam reveals no gallop and no friction rub.   No murmur heard. Pulmonary/Chest: Effort normal and breath sounds normal. No respiratory distress. She has no rales.   Oxygen 2 liters via [place] in place   Abdominal: Soft. Bowel sounds are normal. She exhibits no distension. There is no tenderness. There is no rebound and no guarding.  Genitourinary:  Genitourinary Comments: Continent for both B/B   Musculoskeletal: Normal range of motion. She exhibits no edema or tenderness.  Unsteady gait use wheelchair.   Lymphadenopathy:    She has no cervical adenopathy.  Neurological: She is oriented to person, place, and time.  Skin: Skin is warm and dry. No rash noted. No erythema. No pallor.  Skin intact  Psychiatric: She has a normal mood and affect.    Labs reviewed:  Recent Labs  11/07/15 1351 11/08/15 0648 11/09/15 0347   11/20/15 04/03/16 08/14/16  NA 137 142 144  < > 143 140 143  K 3.5 3.8 3.9  < > 4.5 4.7 4.1  CL 99* 107 106  --   --   --   --  CO2 28 27 32  --   --   --   --   GLUCOSE 127* 154* 154*  --   --   --   --   BUN 35* 20 16  < > 14 13 11   CREATININE 1.08* 0.67 0.52  < > 0.5 0.5 0.5  CALCIUM 8.7* 8.7* 8.6*  --   --   --   --   < > = values in this interval not displayed.  Recent Labs  11/07/15 1351 11/08/15 0648 11/15/15 11/16/15  AST 30 27 34 30  ALT 16 15 32 34  ALKPHOS 69 66 58 64  BILITOT 0.5 0.2*  --   --   PROT 7.1 6.5  --   --   ALBUMIN 2.9* 2.5*  --   --     Recent Labs  11/07/15 1351 11/08/15 0648 11/09/15 0347 11/15/15 04/03/16 08/14/16  WBC 8.0 5.1 6.2 8.6 5.0 7.1  NEUTROABS 7.0  --   --   --   --  63  HGB 9.7* 10.3* 10.5* 10.1* 10.5* 11.4*  HCT 29.5* 33.0* 34.2* 32* 35* 37  MCV 87.8 90.7 92.9  --   --   --   PLT 206 225 230 360 193 240   Lab Results  Component Value Date   TSH 3.14 08/14/2016   Lab Results  Component Value Date   HGBA1C 5.8 04/03/2016   Lab Results  Component Value Date   CHOL 149 08/14/2016   HDL 63 08/14/2016   LDLCALC 69 08/14/2016   TRIG 86 08/14/2016   CHOLHDL 3 05/17/2015   Assessment/Plan Cerumen impaction  Left ear status post debrox gtt x 5 days follow by warm water lavage by facility Nurse. Cerumen impaction persist. Will refer to ENT for evaluation.   Essential hypertension, benign B/p stable. Continue on amlodipine and Ramipril.   Pulmonary emphysema, unspecified emphysema type Breathing stable. Afebrile.Exam findings negative for shortness of breath or worsening wheezing. Continue on Spiriva,albuterol and Symbicort. Continue oxygen 2 Liters [place].   Hyperlipidemia LDL goal <100 Continue on atorvastatin. Monitor Lipid panel.   Family/ staff Communication: Reviewed plan of care with patient and facility Nurse supervisor.   Labs/tests ordered:  None

## 2016-10-30 ENCOUNTER — Non-Acute Institutional Stay (SKILLED_NURSING_FACILITY): Payer: Medicare Other | Admitting: Family

## 2016-10-30 DIAGNOSIS — R399 Unspecified symptoms and signs involving the genitourinary system: Secondary | ICD-10-CM

## 2016-10-30 DIAGNOSIS — R42 Dizziness and giddiness: Secondary | ICD-10-CM | POA: Diagnosis not present

## 2016-10-30 NOTE — Progress Notes (Signed)
Location:  [facility] Room Number: [place] of Service:  SNF (31) Provider:  Evva Din FNP-C  Blanchie Serve, MD  Patient Care Team: Blanchie Serve, MD as PCP - General (Internal Medicine)  Extended Emergency Contact Information Primary Emergency Contact: McDaniel,Cathy Address: [address]          [place], [place] [address] [place] Phone: [phone] Mobile Phone: [phone] Relation: Sister Secondary Emergency Contact: Sheilah Pigeon [place] of [place] Phone: [phone] Relation: Son  Code Status:  Full code Goals of care: Advanced Directive information Advanced Directives 09/24/2016  Does Patient Have a Medical Advance Directive? Yes  Type of Advance Directive Living will  Does patient want to make changes to medical advance directive? -  Copy of [place] in Chart? -  Would patient like information on creating a medical advance directive? -     Chief Complaint  Patient presents with  . Acute Visit    Frequent voiding     HPI:  Pt is a 83 y.o. female seen today at [facility] for an acute visit for evaluation of increased urinary frequency.She thinks might be getting another UTI. She also complains of dizziness all the time sitting or standing. She denies any fever, chills, nausea or vomiting.Facility Nurse reports no new concerns.    Past Medical History:  Diagnosis Date  . Allergy   . Anxiety   . Arthritis   . Breast cancer ([place]) 04/11/06 bx   left breast invasive cductal ca  . 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 Cancer ([place])   . Cataract    extraction  . Depression   . Emphysema   . Emphysema of lung ([place])   . Esophageal reflux   . Full dentures   . History of radiation therapy 07/01/06-08/13/06   left breast  total dose 6100 cGy  . Hx of radiation therapy  08/31/12-09/28/12   right breast   . On home oxygen therapy    at night  . Osteoporosis, unspecified   . Other and unspecified hyperlipidemia   . Other emphysema ([place])   . PONV (postoperative nausea and vomiting)   . Unspecified asthma(493.90)   . Unspecified essential hypertension   . Wears glasses    Past Surgical History:  Procedure Laterality Date  . ABDOMINAL HYSTERECTOMY     partial  . BREAST LUMPECTOMY  07/13/12   right -DCIS 7 o'clock stage Tis,Nx  . BREAST SURGERY    . CATARACT EXTRACTION    . cyst on face  6 months ago  . [place]   left  . MASS EXCISION  02/18/2012   Procedure: MINOR EXCISION OF MASS;  Surgeon: Adin Hector, MD;  Location: [place];  Service: General;  Laterality: Left;  excise 2.5 cm. mass left chest wall  . MASTECTOMY  05/14/06   lt lumpectomy-node dissDr.Leone  . PARTIAL HYSTERECTOMY      Allergies  Allergen Reactions  . Neosporin [Neomycin-Polymyxin B Gu] Itching and Rash  . Codeine Nausea Only  . Irbesartan Hives    Avapro  . Adhesive [Tape] Itching and Rash    To paper tape only. Rash and itching only where applied.    Allergies as of 10/30/2016      Reactions   Neosporin [neomycin-polymyxin B Gu] Itching, Rash   Codeine  Nausea Only   Irbesartan Hives   Avapro   Adhesive [tape] Itching, Rash   To paper tape only. Rash and itching only where applied.      Medication List       Accurate as of 10/30/16  3:47 PM. Always use your most recent med list.          acetaminophen 325 MG tablet Commonly known as:  TYLENOL Take 2 tablets (650 mg total) by mouth every 6 (six) hours as needed for mild pain (or Fever >/= 101).   albuterol (2.5 MG/3ML) 0.083% nebulizer solution Commonly known as:  PROVENTIL Take 2.5 mg by nebulization every 6 (six) hours as needed for wheezing or shortness of breath.   albuterol 108 (90 Base) MCG/ACT inhaler Commonly known as:  PROVENTIL HFA Inhale 2 puffs into the lungs every 6  (six) hours as needed.   ALPRAZolam 0.25 MG tablet Commonly known as:  XANAX Take 0.25 mg by mouth daily. Give at 2 pm   ALPRAZolam 0.5 MG tablet Commonly known as:  XANAX Take 0.5 mg by mouth daily. Give at 6 am and 10 pm   amLODipine 5 MG tablet Commonly known as:  NORVASC TAKE 1 TABLET (5 MG TOTAL) BY MOUTH DAILY.   aspirin 81 MG chewable tablet Chew 81 mg by mouth daily.   atorvastatin 10 MG tablet Commonly known as:  LIPITOR TAKE 1 TABLET (10 MG TOTAL) BY MOUTH DAILY.   budesonide-formoterol 160-4.5 MCG/ACT inhaler Commonly known as:  SYMBICORT Inhale 2 puffs into the lungs 2 (two) times daily.   docusate sodium 100 MG capsule Commonly known as:  COLACE Take 100 mg by mouth daily. For constipation   guaiFENesin 600 MG 12 hr tablet Commonly known as:  MUCINEX Take 600 mg by mouth 2 (two) times daily.   HYDROcodone-homatropine 5-1.5 MG/5ML syrup Commonly known as:  HYCODAN Take 5 mLs by mouth every 6 (six) hours as needed for cough.   loratadine 10 MG tablet Commonly known as:  CLARITIN Take 10 mg by mouth daily.   omeprazole 20 MG capsule Commonly known as:  PRILOSEC Take 1 capsule (20 mg total) by mouth daily.   ondansetron 4 MG tablet Commonly known as:  ZOFRAN Take 1 tablet (4 mg total) by mouth every 6 (six) hours as needed for nausea.   OXYGEN Inhale 2 L into the lungs as needed (with exertion to maintain SaO2 at >/ 88%).   ramipril 10 MG capsule Commonly known as:  ALTACE TAKE 1 CAPSULE (10 MG TOTAL) BY MOUTH DAILY.   simethicone 80 MG chewable tablet Commonly known as:  MYLICON Chew 80 mg by mouth every 6 (six) hours as needed for flatulence.   tiotropium 18 MCG inhalation capsule Commonly known as:  SPIRIVA HANDIHALER PLACE 1 CAPSULE (18 MCG TOTAL) INTO INHALER AND INHALE DAILY.   UNABLE TO FIND Med Name: House Shake  With lunch and dinner per resident request   Vitamin D3 2000 units Tabs Take 1 tablet by mouth daily at 2 PM.        Review of Systems  Constitutional: Negative for activity change, appetite change, chills, fatigue and fever.  HENT: Negative for congestion, rhinorrhea, sinus pressure, sneezing and sore throat.        Decreased hearing to left ear  Eyes: Negative.   Respiratory: Negative for cough, chest tightness and shortness of breath.        Continuous oxygen   Cardiovascular: Negative for chest pain, palpitations and leg  swelling.  Genitourinary: Positive for frequency. Negative for dysuria, flank pain and urgency.  Musculoskeletal: Positive for gait problem.  Skin: Negative for color change, pallor and rash.  Neurological: Negative for dizziness, syncope, light-headedness and headaches.  Psychiatric/Behavioral: Negative for agitation, confusion, hallucinations and sleep disturbance. The patient is not nervous/anxious.     Immunization History  Administered Date(s) Administered  . Influenza Split 07/15/2011, 06/03/2012  . Influenza Whole 11/21/2005, 06/24/2007, 06/15/2008, 07/12/2009, 06/13/2010  . Influenza,inj,Quad PF,36+ Mos 06/23/2013, 06/16/2014, 07/25/2015, 06/21/2016  . Influenza-Unspecified 11/10/2015  . PPD Test 11/14/2015, 11/30/2015  . Pneumococcal Conjugate-13 09/10/2013  . Pneumococcal Polysaccharide-23 09/24/2003  . Pneumococcal-Unspecified 11/10/2015  . Td 09/23/2002   Pertinent  Health Maintenance Due  Topic Date Due  . DEXA SCAN  06/18/1997  . INFLUENZA VACCINE  Completed  . PNA vac Low Risk Adult  Completed   Fall Risk  05/17/2015 03/28/2015 07/26/2014 06/16/2014 11/23/2013  Falls in the past year? Yes No No No No  Number falls in past yr: 1 - - - -  Injury with Fall? Yes - - - -  Risk for fall due to : - - - - Other (Comment)    Vitals:   10/30/16 1130  BP: 126/73  Pulse: 82  Resp: 20  Temp: 98.3 F (36.8 C)  SpO2: 96%  Weight: 139 lb 12.8 oz (63.4 kg)  Height: 5\' 5"  (1.651 m)   Body mass index is 23.26 kg/m. Physical Exam  Constitutional: She is oriented  to person, place, and time. She appears well-developed and well-nourished. No distress.  Pleasant elderly in no acute distress.   HENT:  Head: Normocephalic.  Mouth/Throat: Oropharynx is clear and moist. No oropharyngeal exudate.  HOH  Eyes: Conjunctivae and EOM are normal. Pupils are equal, round, and reactive to light. Right eye exhibits no discharge. Left eye exhibits no discharge. No scleral icterus.  Neck: Normal range of motion. Neck supple. No JVD present. No thyromegaly present.  Cardiovascular: Normal rate, regular rhythm and intact distal pulses.  Exam reveals no gallop and no friction rub.   No murmur heard. Pulmonary/Chest: Effort normal and breath sounds normal. No respiratory distress. She has no wheezes. She has no rales.   Oxygen 2 liters via [place] in place   Abdominal: Soft. Bowel sounds are normal. She exhibits no distension. There is no tenderness. There is no rebound and no guarding.  Musculoskeletal: Normal range of motion. She exhibits no edema or tenderness.  Unsteady gait.  Lymphadenopathy:    She has no cervical adenopathy.  Neurological: She is oriented to person, place, and time.  Skin: Skin is warm and dry. No rash noted. No erythema. No pallor.  Psychiatric: She has a normal mood and affect.    Labs reviewed:  Recent Labs  11/07/15 1351 11/08/15 0648 11/09/15 0347  11/20/15 04/03/16 08/14/16  NA 137 142 144  < > 143 140 143  K 3.5 3.8 3.9  < > 4.5 4.7 4.1  CL 99* 107 106  --   --   --   --   CO2 28 27 32  --   --   --   --   GLUCOSE 127* 154* 154*  --   --   --   --   BUN 35* 20 16  < > 14 13 11   CREATININE 1.08* 0.67 0.52  < > 0.5 0.5 0.5  CALCIUM 8.7* 8.7* 8.6*  --   --   --   --   < > =  values in this interval not displayed.  Recent Labs  11/07/15 1351 11/08/15 0648 11/15/15 11/16/15  AST 30 27 34 30  ALT 16 15 32 34  ALKPHOS 69 66 58 64  BILITOT 0.5 0.2*  --   --   PROT 7.1 6.5  --   --   ALBUMIN 2.9* 2.5*  --   --     Recent Labs   11/07/15 1351 11/08/15 0648 11/09/15 0347 11/15/15 04/03/16 08/14/16  WBC 8.0 5.1 6.2 8.6 5.0 7.1  NEUTROABS 7.0  --   --   --   --  63  HGB 9.7* 10.3* 10.5* 10.1* 10.5* 11.4*  HCT 29.5* 33.0* 34.2* 32* 35* 37  MCV 87.8 90.7 92.9  --   --   --   PLT 206 225 230 360 193 240   Lab Results  Component Value Date   TSH 3.14 08/14/2016   Lab Results  Component Value Date   HGBA1C 5.8 04/03/2016   Lab Results  Component Value Date   CHOL 149 08/14/2016   HDL 63 08/14/2016   LDLCALC 69 08/14/2016   TRIG 86 08/14/2016   CHOLHDL 3 05/17/2015    Assessment/Plan Tract infection symptoms  Afebrile. Has had frequent voiding. Will obtain urine specimen for U/A and C/S rule out UTI. Encourage fluid intake. Continue to monitor.   Dizziness etiology unknown.B/p stable.will obtain CBC/diff. Continue to monitor. Fall and safety precaution.    Family/ staff Communication: Reviewed plan of care with patient and facility Nurse supervisor.   Labs/tests ordered: CBC/diff 10/31/2016.U/A and C/S rule out UTI.

## 2016-10-31 LAB — CBC AND DIFFERENTIAL
HCT: 35 % — AB (ref 36–46)
HEMOGLOBIN: 10.7 g/dL — AB (ref 12.0–16.0)
Platelets: 206 10*3/uL (ref 150–399)
WBC: 6.4 10*3/mL

## 2016-10-31 LAB — BASIC METABOLIC PANEL
BUN: 10 mg/dL (ref 4–21)
CREATININE: 0.5 mg/dL (ref 0.5–1.1)
GLUCOSE: 130 mg/dL
POTASSIUM: 4.3 mmol/L (ref 3.4–5.3)
SODIUM: 143 mmol/L (ref 137–147)

## 2016-11-06 ENCOUNTER — Non-Acute Institutional Stay (SKILLED_NURSING_FACILITY): Payer: Medicare Other | Admitting: Family

## 2016-11-06 DIAGNOSIS — I10 Essential (primary) hypertension: Secondary | ICD-10-CM | POA: Diagnosis not present

## 2016-11-06 DIAGNOSIS — F329 Major depressive disorder, single episode, unspecified: Secondary | ICD-10-CM | POA: Diagnosis not present

## 2016-11-06 DIAGNOSIS — F419 Anxiety disorder, unspecified: Secondary | ICD-10-CM | POA: Diagnosis not present

## 2016-11-06 DIAGNOSIS — J439 Emphysema, unspecified: Secondary | ICD-10-CM | POA: Diagnosis not present

## 2016-11-06 DIAGNOSIS — F32A Depression, unspecified: Secondary | ICD-10-CM

## 2016-11-06 NOTE — Progress Notes (Signed)
Location:  [place] Room Number: [place] of Service:  SNF (31) Provider: Dinah Ngetich FNP-C   Blanchie Serve, MD  Patient Care Team: Blanchie Serve, MD as PCP - General (Internal Medicine)  Extended Emergency Contact Information Primary Emergency Contact: McDaniel,Cathy Address: [address]          [place], [place] [address] [place] Phone: [phone] Mobile Phone: [phone] Relation: Sister Secondary Emergency Contact: Sheilah Pigeon [place] of [place] Phone: [phone] Relation: Son  Code Status:  Full code [place] Proffer  Goals of care: Advanced Directive information Advanced Directives 09/24/2016  Does Patient Have a Medical Advance Directive? Yes  Type of Advance Directive Living will  Does patient want to make changes to medical advance directive? -  Copy of [place] in Chart? -  Would patient like information on creating a medical advance directive? -     Chief Complaint  Patient presents with  . Medical Management of Chronic Issues    HPI:  Pt is a 83 y.o. female seen today at [facility] for medical management of chronic diseases. She has a medical history of COPD, HTN, GERD, OA, Hyperlipidemia, depression, Anxiety among other conditions. She is seen in her room today.She had increased urinary frequency recently but U/A and C/S was negative for UTI. She states symptoms has improved with increased fluid intake. She denies any fever or chills.Facility Nurse reports no new concerns.    Past Medical History:  Diagnosis Date  . Allergy   . Anxiety   . Arthritis   . Breast cancer ([place]) 04/11/06 bx   left breast invasive cductal ca  . 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 Cancer ([place])   . Cataract    extraction  . Depression   . Emphysema   . Emphysema of lung ([place])   .  Esophageal reflux   . Full dentures   . History of radiation therapy 07/01/06-08/13/06   left breast  total dose 6100 cGy  . Hx of radiation therapy 08/31/12-09/28/12   right breast   . On home oxygen therapy    at night  . Osteoporosis, unspecified   . Other and unspecified hyperlipidemia   . Other emphysema ([place])   . PONV (postoperative nausea and vomiting)   . Unspecified asthma(493.90)   . Unspecified essential hypertension   . Wears glasses    Past Surgical History:  Procedure Laterality Date  . ABDOMINAL HYSTERECTOMY     partial  . BREAST LUMPECTOMY  07/13/12   right -DCIS 7 o'clock stage Tis,Nx  . BREAST SURGERY    . CATARACT EXTRACTION    . cyst on face  6 months ago  . [place]   left  . MASS EXCISION  02/18/2012   Procedure: MINOR EXCISION OF MASS;  Surgeon: Adin Hector, MD;  Location: [place];  Service: General;  Laterality: Left;  excise 2.5 cm. mass left chest wall  . MASTECTOMY  05/14/06   lt lumpectomy-node dissDr.Leone  . PARTIAL HYSTERECTOMY      Allergies  Allergen Reactions  . Neosporin [Neomycin-Polymyxin B Gu] Itching and Rash  . Codeine Nausea Only  . Irbesartan Hives    Avapro  . Adhesive [Tape] Itching and Rash    To paper tape only. Rash and itching only where applied.  Allergies as of 11/06/2016      Reactions   Neosporin [neomycin-polymyxin B Gu] Itching, Rash   Codeine Nausea Only   Irbesartan Hives   Avapro   Adhesive [tape] Itching, Rash   To paper tape only. Rash and itching only where applied.      Medication List       Accurate as of 11/06/16 11:05 PM. Always use your most recent med list.          acetaminophen 325 MG tablet Commonly known as:  TYLENOL Take 2 tablets (650 mg total) by mouth every 6 (six) hours as needed for mild pain (or Fever >/= 101).   albuterol (2.5 MG/3ML) 0.083% nebulizer solution Commonly known as:  PROVENTIL Take 2.5 mg by nebulization every 6 (six) hours as needed  for wheezing or shortness of breath.   albuterol 108 (90 Base) MCG/ACT inhaler Commonly known as:  PROVENTIL HFA Inhale 2 puffs into the lungs every 6 (six) hours as needed.   ALPRAZolam 0.25 MG tablet Commonly known as:  XANAX Take 0.25 mg by mouth daily. Give at 2 pm   ALPRAZolam 0.5 MG tablet Commonly known as:  XANAX Take 0.5 mg by mouth daily. Give at 6 am and 10 pm   amLODipine 5 MG tablet Commonly known as:  NORVASC TAKE 1 TABLET (5 MG TOTAL) BY MOUTH DAILY.   aspirin 81 MG chewable tablet Chew 81 mg by mouth daily.   atorvastatin 10 MG tablet Commonly known as:  LIPITOR TAKE 1 TABLET (10 MG TOTAL) BY MOUTH DAILY.   budesonide-formoterol 160-4.5 MCG/ACT inhaler Commonly known as:  SYMBICORT Inhale 2 puffs into the lungs 2 (two) times daily.   docusate sodium 100 MG capsule Commonly known as:  COLACE Take 100 mg by mouth daily. For constipation   guaiFENesin 600 MG 12 hr tablet Commonly known as:  MUCINEX Take 600 mg by mouth 2 (two) times daily.   HYDROcodone-homatropine 5-1.5 MG/5ML syrup Commonly known as:  HYCODAN Take 5 mLs by mouth every 6 (six) hours as needed for cough.   loratadine 10 MG tablet Commonly known as:  CLARITIN Take 10 mg by mouth daily.   omeprazole 20 MG capsule Commonly known as:  PRILOSEC Take 1 capsule (20 mg total) by mouth daily.   ondansetron 4 MG tablet Commonly known as:  ZOFRAN Take 1 tablet (4 mg total) by mouth every 6 (six) hours as needed for nausea.   OXYGEN Inhale 2 L into the lungs as needed (with exertion to maintain SaO2 at >/ 88%).   ramipril 10 MG capsule Commonly known as:  ALTACE TAKE 1 CAPSULE (10 MG TOTAL) BY MOUTH DAILY.   simethicone 80 MG chewable tablet Commonly known as:  MYLICON Chew 80 mg by mouth every 6 (six) hours as needed for flatulence.   tiotropium 18 MCG inhalation capsule Commonly known as:  SPIRIVA HANDIHALER PLACE 1 CAPSULE (18 MCG TOTAL) INTO INHALER AND INHALE DAILY.   UNABLE  TO FIND Med Name: House Shake  With lunch and dinner per resident request   Vitamin D3 2000 units Tabs Take 1 tablet by mouth daily at 2 PM.       Review of Systems  Constitutional: Negative for activity change, appetite change, chills, fatigue and fever.  HENT: Negative for congestion, rhinorrhea, sinus pressure, sneezing and sore throat.   Eyes: Negative.   Respiratory: Negative for cough, chest tightness and shortness of breath.        Continuous oxygen  Cardiovascular: Negative for chest pain, palpitations and leg swelling.  Gastrointestinal: Negative for abdominal distention, abdominal pain, constipation, diarrhea, nausea and vomiting.  Endocrine: Negative for cold intolerance, heat intolerance, polydipsia, polyphagia and polyuria.  Genitourinary: Negative for dysuria, frequency and urgency.  Musculoskeletal: Positive for gait problem.  Skin: Negative for color change, pallor and rash.  Neurological: Negative for dizziness, syncope, light-headedness and headaches.  Hematological: Does not bruise/bleed easily.  Psychiatric/Behavioral: Negative for agitation, confusion, hallucinations and sleep disturbance. The patient is not nervous/anxious.     Immunization History  Administered Date(s) Administered  . Influenza Split 07/15/2011, 06/03/2012  . Influenza Whole 11/21/2005, 06/24/2007, 06/15/2008, 07/12/2009, 06/13/2010  . Influenza,inj,Quad PF,36+ Mos 06/23/2013, 06/16/2014, 07/25/2015, 06/21/2016  . Influenza-Unspecified 11/10/2015  . PPD Test 11/14/2015, 11/30/2015  . Pneumococcal Conjugate-13 09/10/2013  . Pneumococcal Polysaccharide-23 09/24/2003  . Pneumococcal-Unspecified 11/10/2015  . Td 09/23/2002   Pertinent  Health Maintenance Due  Topic Date Due  . DEXA SCAN  06/18/1997  . INFLUENZA VACCINE  Completed  . PNA vac Low Risk Adult  Completed   Fall Risk  05/17/2015 03/28/2015 07/26/2014 06/16/2014 11/23/2013  Falls in the past year? Yes No No No No  Number falls in  past yr: 1 - - - -  Injury with Fall? Yes - - - -  Risk for fall due to : - - - - Other (Comment)      Vitals:   11/06/16 1145  BP: 112/62  Pulse: 77  Resp: 18  Temp: 97.4 F (36.3 C)  SpO2: 95%  Weight: 139 lb 12.8 oz (63.4 kg)  Height: 5\' 5"  (1.651 m)   Body mass index is 23.26 kg/m. Physical Exam  Constitutional: She is oriented to person, place, and time. She appears well-developed and well-nourished. No distress.  HENT:  Head: Normocephalic.  Mouth/Throat: Oropharynx is clear and moist. No oropharyngeal exudate.  HOH  Eyes: Conjunctivae and EOM are normal. Pupils are equal, round, and reactive to light. Right eye exhibits no discharge. Left eye exhibits no discharge. No scleral icterus.  Neck: Normal range of motion. Neck supple. No JVD present. No thyromegaly present.  Cardiovascular: Normal rate, regular rhythm and intact distal pulses.  Exam reveals no gallop and no friction rub.   No murmur heard. Pulmonary/Chest: Effort normal. No respiratory distress. She has wheezes. She has no rales.   Oxygen 2 liters via [place] in place   Abdominal: Soft. Bowel sounds are normal. She exhibits no distension. There is no tenderness. There is no rebound and no guarding.  Genitourinary:  Genitourinary Comments: Continent  Musculoskeletal: Normal range of motion. She exhibits no edema or tenderness.  Unsteady gait  Lymphadenopathy:    She has no cervical adenopathy.  Neurological: She is oriented to person, place, and time.  Skin: Skin is warm and dry. No rash noted. No erythema. No pallor.  Psychiatric: She has a normal mood and affect.    Labs reviewed:  Recent Labs  11/08/15 0648 11/09/15 0347  11/20/15 04/03/16 08/14/16  NA 142 144  < > 143 140 143  K 3.8 3.9  < > 4.5 4.7 4.1  CL 107 106  --   --   --   --   CO2 27 32  --   --   --   --   GLUCOSE 154* 154*  --   --   --   --   BUN 20 16  < > 14 13 11   CREATININE 0.67 0.52  < > 0.5  0.5 0.5  CALCIUM 8.7* 8.6*  --   --    --   --   < > = values in this interval not displayed.  Recent Labs  11/08/15 0648 11/15/15 11/16/15  AST 27 34 30  ALT 15 32 34  ALKPHOS 66 58 64  BILITOT 0.2*  --   --   PROT 6.5  --   --   ALBUMIN 2.5*  --   --     Recent Labs  11/08/15 0648 11/09/15 0347 11/15/15 04/03/16 08/14/16  WBC 5.1 6.2 8.6 5.0 7.1  NEUTROABS  --   --   --   --  63  HGB 10.3* 10.5* 10.1* 10.5* 11.4*  HCT 33.0* 34.2* 32* 35* 37  MCV 90.7 92.9  --   --   --   PLT 225 230 360 193 240   Lab Results  Component Value Date   TSH 3.14 08/14/2016   Lab Results  Component Value Date   HGBA1C 5.8 04/03/2016   Lab Results  Component Value Date   CHOL 149 08/14/2016   HDL 63 08/14/2016   LDLCALC 69 08/14/2016   TRIG 86 08/14/2016   CHOLHDL 3 05/17/2015   Assessment/Plan  Essential hypertension, benign B/p stable. Continue on amlodipine and Ramipril. Monitor BMP.   Pulmonary emphysema, unspecified emphysema type Breathing stable. Afebrile.Has diminished exp wheezes but no shortness of breath. Continue on Spiriva,albuterol and Symbicort. Continue oxygen 2 Liters [place].   Depression  Stable. Enjoys to participate on facility activities.Has family support.continue to monitor for mood changes.     Anxiety Stable. Continue on Alprazolam.   Family/ staff Communication: Reviewed plan of care with patient and facility Nurse supervisor.   Labs/tests ordered:  None

## 2016-11-14 LAB — HEPATIC FUNCTION PANEL
ALK PHOS: 72 U/L (ref 25–125)
ALT: 9 U/L (ref 7–35)
AST: 13 U/L (ref 13–35)
Bilirubin, Total: 0.3 mg/dL

## 2016-11-14 LAB — LIPID PANEL
CHOLESTEROL: 160 mg/dL (ref 0–200)
HDL: 66 mg/dL (ref 35–70)
LDL CALC: 81 mg/dL
TRIGLYCERIDES: 66 mg/dL (ref 40–160)

## 2016-11-29 ENCOUNTER — Non-Acute Institutional Stay (SKILLED_NURSING_FACILITY): Payer: Medicare Other | Admitting: Family

## 2016-11-29 ENCOUNTER — Encounter: Payer: Self-pay | Admitting: Family

## 2016-11-29 DIAGNOSIS — K219 Gastro-esophageal reflux disease without esophagitis: Secondary | ICD-10-CM | POA: Diagnosis not present

## 2016-11-29 DIAGNOSIS — F419 Anxiety disorder, unspecified: Secondary | ICD-10-CM | POA: Diagnosis not present

## 2016-11-29 DIAGNOSIS — I10 Essential (primary) hypertension: Secondary | ICD-10-CM

## 2016-11-29 DIAGNOSIS — J439 Emphysema, unspecified: Secondary | ICD-10-CM

## 2016-11-29 NOTE — Progress Notes (Signed)
Location:  [facility] Room Number: [phone] Place of Service:  SNF [phone]) Provider:  Marlowe Sax, NP  Blanchie Serve, MD  Patient Care Team: Blanchie Serve, MD as PCP - General (Internal Medicine)  Extended Emergency Contact Information Primary Emergency Contact: McDaniel,Cathy Address: [address]          [place], [place] [address] [place] Phone: [phone] Mobile Phone: [phone] Relation: Sister Secondary Emergency Contact: Sheilah Pigeon [place] Phone: [phone] Relation: Son  Code Status:  DNR Goals of care: Advanced Directive information Advanced Directives 11/29/2016  Does Patient Have a Medical Advance Directive? Yes  Type of Paramedic of [place];Living will  Does patient want to make changes to medical advance directive? -  Copy of [place] in Chart? Yes  Would patient like information on creating a medical advance directive? -     Chief Complaint  Patient presents with  . Medical Management of Chronic Issues    routine visit    HPI:  Pt is a 83 y.o. female seen today at [facility] for medical management of chronic diseases. She has a medical history of HTN, Hyperlipidemia, COPD, GERD, OA , Depression, anxiety among other conditions. She is seen in her room today. She denies any acute issues this visit. She continues to exercise by self propelling her wheelchair around the facility. She has had a weight gain of one pound over one months. She has had no recent acute illness or fall episodes. She has an upcoming appointment with Pulmonologist for COPD 01/14/2017. Facility Nurse reports no new concerns.      Past Medical History:  Diagnosis Date  . Allergy   . Anxiety   . Arthritis   . Breast cancer ([place]) 04/11/06 bx   left breast invasive cductal ca  . Breast cancer ([place]) 07/01/12 bx   right breast,low grade ductal  carcinoma in situ w/assoc calcification,ER/PR=+,  . Cancer ([place])   . Cataract    extraction  . Depression   . Emphysema   . Emphysema of lung ([place])   . Esophageal reflux   . Full dentures   . History of radiation therapy 07/01/06-08/13/06   left breast  total dose 6100 cGy  . Hx of radiation therapy 08/31/12-09/28/12   right breast   . On home oxygen therapy    at night  . Osteoporosis, unspecified   . Other and unspecified hyperlipidemia   . Other emphysema ([place])   . PONV (postoperative nausea and vomiting)   . Unspecified asthma(493.90)   . Unspecified essential hypertension   . Wears glasses    Past Surgical History:  Procedure Laterality Date  . ABDOMINAL HYSTERECTOMY     partial  . BREAST LUMPECTOMY  07/13/12   right -DCIS 7 o'clock stage Tis,Nx  . BREAST SURGERY    . CATARACT EXTRACTION    . cyst on face  6 months ago  . [place]   left  . MASS EXCISION  02/18/2012   Procedure: MINOR EXCISION OF MASS;  Surgeon: Adin Hector, MD;  Location: [facility];  Service: General;  Laterality: Left;  excise 2.5 cm. mass left chest wall  . MASTECTOMY  05/14/06   lt lumpectomy-node dissDr.Leone  . PARTIAL HYSTERECTOMY      Allergies  Allergen Reactions  . Neosporin [Neomycin-Polymyxin B Gu] Itching and Rash  . Codeine Nausea Only  .  Irbesartan Hives    Avapro  . Adhesive [Tape] Itching and Rash    To paper tape only. Rash and itching only where applied.    Allergies as of 11/29/2016      Reactions   Neosporin [neomycin-polymyxin B Gu] Itching, Rash   Codeine Nausea Only   Irbesartan Hives   Avapro   Adhesive [tape] Itching, Rash   To paper tape only. Rash and itching only where applied.      Medication List       Accurate as of 11/29/16  2:13 PM. Always use your most recent med list.          acetaminophen 325 MG tablet Commonly known as:  TYLENOL Take 2 tablets (650 mg total) by mouth every 6 (six) hours as needed for mild pain (or  Fever >/= 101).   albuterol (2.5 MG/3ML) 0.083% nebulizer solution Commonly known as:  PROVENTIL Take 2.5 mg by nebulization every 6 (six) hours as needed for wheezing or shortness of breath.   albuterol 108 (90 Base) MCG/ACT inhaler Commonly known as:  PROVENTIL HFA Inhale 2 puffs into the lungs every 6 (six) hours as needed.   ALPRAZolam 0.25 MG tablet Commonly known as:  XANAX Take 0.25 mg by mouth daily. Give at 2 pm   ALPRAZolam 0.5 MG tablet Commonly known as:  XANAX Take 0.5 mg by mouth daily. Give at 6 am and 10 pm   amLODipine 5 MG tablet Commonly known as:  NORVASC TAKE 1 TABLET (5 MG TOTAL) BY MOUTH DAILY.   aspirin 81 MG chewable tablet Chew 81 mg by mouth daily.   atorvastatin 10 MG tablet Commonly known as:  LIPITOR Take 10 mg by mouth. Take 1/2 tablet daily for cholesterol   budesonide-formoterol 160-4.5 MCG/ACT inhaler Commonly known as:  SYMBICORT Inhale 2 puffs into the lungs 2 (two) times daily.   docusate sodium 100 MG capsule Commonly known as:  COLACE Take 100 mg by mouth daily. For constipation   guaiFENesin 600 MG 12 hr tablet Commonly known as:  MUCINEX Take 600 mg by mouth 2 (two) times daily.   HYDROcodone-homatropine 5-1.5 MG/5ML syrup Commonly known as:  HYCODAN Take 5 mLs by mouth every 6 (six) hours as needed for cough.   loratadine 10 MG tablet Commonly known as:  CLARITIN Take 10 mg by mouth daily.   omeprazole 20 MG capsule Commonly known as:  PRILOSEC Take 1 capsule (20 mg total) by mouth daily.   ondansetron 4 MG tablet Commonly known as:  ZOFRAN Take 1 tablet (4 mg total) by mouth every 6 (six) hours as needed for nausea.   OXYGEN Inhale 2 L into the lungs as needed (with exertion to maintain SaO2 at >/ 88%).   ramipril 10 MG capsule Commonly known as:  ALTACE TAKE 1 CAPSULE (10 MG TOTAL) BY MOUTH DAILY.   simethicone 80 MG chewable tablet Commonly known as:  MYLICON Chew 80 mg by mouth every 6 (six) hours as  needed for flatulence.   tiotropium 18 MCG inhalation capsule Commonly known as:  SPIRIVA HANDIHALER PLACE 1 CAPSULE (18 MCG TOTAL) INTO INHALER AND INHALE DAILY.   UNABLE TO FIND Med Name: House Shake  With lunch and dinner per resident request   Vitamin D3 2000 units Tabs Take 1 tablet by mouth daily at 2 PM.       Review of Systems  Constitutional: Negative for activity change, appetite change, chills, fatigue and fever.  HENT: Negative for congestion, rhinorrhea,  sinus pressure, sneezing and sore throat.   Eyes: Negative.   Respiratory: Negative for cough, chest tightness, shortness of breath and wheezing.        Continuous oxygen   Cardiovascular: Negative for chest pain, palpitations and leg swelling.  Gastrointestinal: Negative for abdominal distention, abdominal pain, constipation, diarrhea, nausea and vomiting.  Endocrine: Negative for cold intolerance, heat intolerance, polydipsia, polyphagia and polyuria.  Genitourinary: Negative for dysuria, frequency and urgency.  Musculoskeletal: Positive for arthralgias and gait problem.  Skin: Negative for color change, pallor and rash.  Neurological: Negative for dizziness, syncope, light-headedness and headaches.  Hematological: Does not bruise/bleed easily.  Psychiatric/Behavioral: Negative for agitation, confusion, hallucinations and sleep disturbance. The patient is not nervous/anxious.     Immunization History  Administered Date(s) Administered  . Influenza Split 07/15/2011, 06/03/2012  . Influenza Whole 11/21/2005, 06/24/2007, 06/15/2008, 07/12/2009, 06/13/2010  . Influenza,inj,Quad PF,36+ Mos 06/23/2013, 06/16/2014, 07/25/2015, 06/21/2016  . Influenza-Unspecified 11/10/2015  . PPD Test 11/14/2015, 11/30/2015  . Pneumococcal Conjugate-13 09/10/2013  . Pneumococcal Polysaccharide-23 09/24/2003  . Pneumococcal-Unspecified 11/10/2015  . Td 09/23/2002   Pertinent  Health Maintenance Due  Topic Date Due  . DEXA SCAN   06/18/1997  . INFLUENZA VACCINE  Completed  . PNA vac Low Risk Adult  Completed   Fall Risk  05/17/2015 03/28/2015 07/26/2014 06/16/2014 11/23/2013  Falls in the past year? Yes No No No No  Number falls in past yr: 1 - - - -  Injury with Fall? Yes - - - -  Risk for fall due to : - - - - Other (Comment)    Vitals:   11/29/16 1358  BP: (!) 144/68  Pulse: 78  Resp: 19  Temp: 97.8 F (36.6 C)  SpO2: 98%  Weight: 141 lb (64 kg)  Height: 5\' 5"  (1.651 m)   Body mass index is 23.46 kg/m. Physical Exam  Constitutional: She is oriented to person, place, and time. She appears well-developed and well-nourished. No distress.  HENT:  Head: Normocephalic.  Mouth/Throat: Oropharynx is clear and moist. No oropharyngeal exudate.  HOH  Eyes: Conjunctivae and EOM are normal. Pupils are equal, round, and reactive to light. Right eye exhibits no discharge. Left eye exhibits no discharge. No scleral icterus.  Neck: Normal range of motion. Neck supple. No JVD present. No thyromegaly present.  Cardiovascular: Normal rate, regular rhythm and intact distal pulses.  Exam reveals no gallop and no friction rub.   No murmur heard. Pulmonary/Chest: Effort normal and breath sounds normal. No respiratory distress. She has no wheezes. She has no rales.   Oxygen 2 liters via [place] in place   Abdominal: Soft. Bowel sounds are normal. She exhibits no distension. There is no tenderness. There is no rebound and no guarding.  Genitourinary:  Genitourinary Comments: Continent  Musculoskeletal: Normal range of motion. She exhibits no edema or tenderness.  Unsteady gait. Moves x 4 extremites  Lymphadenopathy:    She has no cervical adenopathy.  Neurological: She is oriented to person, place, and time.  Skin: Skin is warm and dry. No rash noted. No erythema. No pallor.  Psychiatric: She has a normal mood and affect.    Labs reviewed:  Recent Labs  04/03/16 08/14/16  NA 140 143  K 4.7 4.1  BUN 13 11  CREATININE 0.5  0.5    Recent Labs  04/03/16 08/14/16  WBC 5.0 7.1  NEUTROABS  --  63  HGB 10.5* 11.4*  HCT 35* 37  PLT 193 240   Lab  Results  Component Value Date   TSH 3.14 08/14/2016   Lab Results  Component Value Date   HGBA1C 5.8 04/03/2016   Lab Results  Component Value Date   CHOL 149 08/14/2016   HDL 63 08/14/2016   LDLCALC 69 08/14/2016   TRIG 86 08/14/2016   CHOLHDL 3 05/17/2015   Assessment/Plan  COPD Breathing stable. Afebrile.Exam findings negative for wheezes.Continue on Spiriva,albuterol and Symbicort. Continue oxygen 2 Liters nasal cannula.continue to follow up with Pulmonologist as directed.    HTN B/p stable.Continue on amlodipine and Ramipril.On ASA for prophylaxis. Monitor BMP.   Anxiety  Stable.No mood changes noted.Continue on Alprazolam.  GERD  Continue on Omeprazole.Mg level with next blood work.   Family/ staff Communication: Reviewed plan of care with patient and facility Nurse supervisor.   Labs/tests ordered: None

## 2016-12-10 DIAGNOSIS — E785 Hyperlipidemia, unspecified: Secondary | ICD-10-CM | POA: Diagnosis not present

## 2016-12-10 LAB — LIPID PANEL
Cholesterol: 132 mg/dL (ref 0–200)
HDL: 46 mg/dL (ref 35–70)
LDL CALC: 69 mg/dL
TRIGLYCERIDES: 85 mg/dL (ref 40–160)

## 2017-01-01 ENCOUNTER — Non-Acute Institutional Stay (SKILLED_NURSING_FACILITY): Payer: Medicare Other | Admitting: Family

## 2017-01-01 ENCOUNTER — Encounter: Payer: Self-pay | Admitting: Family

## 2017-01-01 DIAGNOSIS — I10 Essential (primary) hypertension: Secondary | ICD-10-CM

## 2017-01-01 DIAGNOSIS — E785 Hyperlipidemia, unspecified: Secondary | ICD-10-CM

## 2017-01-01 DIAGNOSIS — K219 Gastro-esophageal reflux disease without esophagitis: Secondary | ICD-10-CM

## 2017-01-01 NOTE — Progress Notes (Signed)
Location:  [facility] Room Number: 903-B Place of Service:  SNF (31) Provider:  Marlowe Sax, NP  Blanchie Serve, MD  Patient Care Team: Blanchie Serve, MD as PCP - General (Internal Medicine)  Extended Emergency Contact Information Primary Emergency Contact: McDaniel,Cathy Address: [address]          [place], [place] [address] [place] Phone: [phone] Mobile Phone: [phone] Relation: Sister Secondary Emergency Contact: Sheilah Pigeon [place] Phone: [phone] Relation: Son  Code Status:  DNR Goals of care: Advanced Directive information Advanced Directives 11/29/2016  Does Patient Have a Medical Advance Directive? Yes  Type of Paramedic of [place];Living will  Does patient want to make changes to medical advance directive? -  Copy of [name]? Yes  Would patient like information on creating a medical advance directive? -     Chief Complaint  Patient presents with  . Medical Management of Chronic Issues    Routine VIsit     HPI:  Pt is a 83 y.o. female [name] for medical management of chronic diseases. She has a medical history of HTN, Hyperlipidemia, COPD, GERD, OA , Depression, anxiety among other conditions. She is seen in her room today. She denies any acute issues this visit.she has had no recent weight changes or acute illnesses since prior visit.she has been taking Omeprazole for > 12 months pharmacy recommends weaning off then start H2 blocker due to high risk for C-diff with long term use of PPI.will attempt to wean off omeprazole this visit. Facility Nurse reports no new concerns.      Past Medical History:  Diagnosis Date  . Allergy   . Anxiety   . Arthritis   . Breast cancer ([place]) 04/11/06 bx   left breast invasive cductal ca  . Breast cancer ([place]) 07/01/12 bx   right  breast,low grade ductal carcinoma in situ w/assoc calcification,ER/PR=+,  . Cancer ([place] [address])   . Cataract    extraction  . Depression   . Emphysema   . Emphysema of lung ([facility])   . Esophageal reflux   . Full dentures   . History of radiation therapy 07/01/06-08/13/06   left breast  total dose 6100 cGy  . Hx of radiation therapy 08/31/12-09/28/12   right breast   . On home oxygen therapy    at night  . Osteoporosis, unspecified   . Other and unspecified hyperlipidemia   . Other emphysema ([place])   . PONV (postoperative nausea and vomiting)   . Unspecified asthma(493.90)   . Unspecified essential hypertension   . Wears glasses    Past Surgical History:  Procedure Laterality Date  . ABDOMINAL HYSTERECTOMY     partial  . BREAST LUMPECTOMY  07/13/12   right -DCIS 7 o'clock stage Tis,Nx  . BREAST SURGERY    . CATARACT EXTRACTION    . cyst on face  6 months ago  . [name]   left  . MASS EXCISION  02/18/2012   Procedure: MINOR EXCISION OF MASS;  Surgeon: Adin Hector, MD;  Location: [facility];  Service: General;  Laterality: Left;  excise 2.5 cm. mass left chest wall  . MASTECTOMY  05/14/06   lt lumpectomy-node dissDr.Leone  . PARTIAL HYSTERECTOMY      Allergies  Allergen Reactions  . Neosporin [Neomycin-Polymyxin B Gu] Itching and Rash  .  Codeine Nausea Only  . Irbesartan Hives    Avapro  . Adhesive [Tape] Itching and Rash    To paper tape only. Rash and itching only where applied.    Allergies as of 01/01/2017      Reactions   Neosporin [neomycin-polymyxin B Gu] Itching, Rash   Codeine Nausea Only   Irbesartan Hives   Avapro   Adhesive [tape] Itching, Rash   To paper tape only. Rash and itching only where applied.      Medication List       Accurate as of 01/01/17 11:18 PM. Always use your most recent med list.          acetaminophen 325 MG tablet Commonly known as:  TYLENOL Take 2 tablets (650 mg total) by mouth every 6 (six) hours as  needed for mild pain (or Fever >/= 101).   albuterol (2.5 MG/3ML) 0.083% nebulizer solution Commonly known as:  PROVENTIL Take 2.5 mg by nebulization every 6 (six) hours as needed for wheezing or shortness of breath.   albuterol 108 (90 Base) MCG/ACT inhaler Commonly known as:  PROVENTIL HFA Inhale 2 puffs into the lungs every 6 (six) hours as needed.   ALPRAZolam 0.25 MG tablet Commonly known as:  XANAX Take 0.25 mg by mouth daily. Give at 2 pm   ALPRAZolam 0.5 MG tablet Commonly known as:  XANAX Take 0.5 mg by mouth daily. Give at 6 am and 10 pm   amLODipine 5 MG tablet Commonly known as:  NORVASC TAKE 1 TABLET (5 MG TOTAL) BY MOUTH DAILY.   aspirin 81 MG chewable tablet Chew 81 mg by mouth daily.   atorvastatin 10 MG tablet Commonly known as:  LIPITOR Take 1/2 tablet daily for cholesterol   budesonide-formoterol 160-4.5 MCG/ACT inhaler Commonly known as:  SYMBICORT Inhale 2 puffs into the lungs 2 (two) times daily. 1 min in between each inhalation   docusate sodium 100 MG capsule Commonly known as:  COLACE Take 100 mg by mouth daily. For constipation   guaiFENesin 600 MG 12 hr tablet Commonly known as:  MUCINEX Take 600 mg by mouth 2 (two) times daily.   HYDROcodone-homatropine 5-1.5 MG/5ML syrup Commonly known as:  HYCODAN Take 5 mLs by mouth every 6 (six) hours as needed for cough.   loratadine 10 MG tablet Commonly known as:  CLARITIN Take 10 mg by mouth daily.   omeprazole 20 MG capsule Commonly known as:  PRILOSEC Take 1 capsule (20 mg total) by mouth daily.   ondansetron 4 MG tablet Commonly known as:  ZOFRAN Take 1 tablet (4 mg total) by mouth every 6 (six) hours as needed for nausea.   OXYGEN Inhale 2 L into the lungs as needed (with exertion to maintain SaO2 at >/ 88%).   ramipril 10 MG capsule Commonly known as:  ALTACE TAKE 1 CAPSULE (10 MG TOTAL) BY MOUTH DAILY.   simethicone 80 MG chewable tablet Commonly known as:  MYLICON Chew 80 mg  by mouth every 6 (six) hours as needed for flatulence.   tiotropium 18 MCG inhalation capsule Commonly known as:  SPIRIVA HANDIHALER PLACE 1 CAPSULE (18 MCG TOTAL) INTO INHALER AND INHALE DAILY.   UNABLE TO FIND Med Name: House Shake  With lunch and dinner per resident request   Vitamin D3 2000 units Tabs Take 1 tablet by mouth daily at 2 PM.       Review of Systems  Constitutional: Negative for activity change, appetite change, chills, fatigue and fever.  HENT: Negative for congestion, rhinorrhea, sinus pressure, sneezing and sore throat.   Eyes: Negative.   Respiratory: Negative for cough, chest tightness, shortness of breath and wheezing.        Continuous oxygen   Cardiovascular: Negative for chest pain, palpitations and leg swelling.  Gastrointestinal: Negative for abdominal distention, abdominal pain, constipation, diarrhea, nausea and vomiting.  Endocrine: Negative for cold intolerance, heat intolerance, polydipsia, polyphagia and polyuria.  Genitourinary: Negative for dysuria, frequency and urgency.  Musculoskeletal: Positive for arthralgias and gait problem.  Skin: Negative for color change, pallor and rash.  Neurological: Negative for dizziness, syncope, light-headedness and headaches.  Hematological: Does not bruise/bleed easily.  Psychiatric/Behavioral: Negative for agitation, confusion, hallucinations and sleep disturbance. The patient is not nervous/anxious.     Immunization History  Administered Date(s) Administered  . Influenza Split 07/15/2011, 06/03/2012  . Influenza Whole 11/21/2005, 06/24/2007, 06/15/2008, 07/12/2009, 06/13/2010  . Influenza,inj,Quad PF,36+ Mos 06/23/2013, 06/16/2014, 07/25/2015, 06/21/2016  . Influenza-Unspecified 11/10/2015  . PPD Test 11/14/2015, 11/30/2015  . Pneumococcal Conjugate-13 09/10/2013  . Pneumococcal Polysaccharide-23 09/24/2003  . Pneumococcal-Unspecified 11/10/2015  . Td 09/23/2002   Pertinent  Health Maintenance Due    Topic Date Due  . DEXA SCAN  09/24/2023 (Originally 06/18/1997)  . INFLUENZA VACCINE  04/23/2017  . PNA vac Low Risk Adult  Completed   Fall Risk  05/17/2015 03/28/2015 07/26/2014 06/16/2014 11/23/2013  Falls in the past year? Yes No No No No  Number falls in past yr: 1 - - - -  Injury with Fall? Yes - - - -  Risk for fall due to : - - - - Other (Comment)    Vitals:   01/01/17 1056  BP: (!) 143/73  Pulse: 75  Resp: 20  Temp: 97.9 F (36.6 C)  SpO2: 96%  Weight: 140 lb (63.5 kg)  Height: 5\' 5"  (1.651 m)   Body mass index is 23.3 kg/m. Physical Exam  Constitutional: She is oriented to person, place, and time. She appears well-developed and well-nourished. No distress.  HENT:  Head: Normocephalic.  Mouth/Throat: Oropharynx is clear and moist. No oropharyngeal exudate.  HOH  Eyes: Conjunctivae and EOM are normal. Pupils are equal, round, and reactive to light. Right eye exhibits no discharge. Left eye exhibits no discharge. No scleral icterus.  Neck: Normal range of motion. Neck supple. No JVD present. No thyromegaly present.  Cardiovascular: Normal rate, regular rhythm and intact distal pulses.  Exam reveals no gallop and no friction rub.   No murmur heard. Pulmonary/Chest: Effort normal and breath sounds normal. No respiratory distress. She has no wheezes. She has no rales.   Oxygen 2 liters via [place] in place   Abdominal: Soft. Bowel sounds are normal. She exhibits no distension. There is no tenderness. There is no rebound and no guarding.  Genitourinary:  Genitourinary Comments: Continent  Musculoskeletal: Normal range of motion. She exhibits no edema or tenderness.  Unsteady gait.   Lymphadenopathy:    She has no cervical adenopathy.  Neurological: She is oriented to person, place, and time.  Skin: Skin is warm and dry. No rash noted. No erythema. No pallor.  Skin intact  Psychiatric: She has a normal mood and affect.    Labs reviewed:  Recent Labs  04/03/16 08/14/16  10/31/16 0729  NA 140 143 143  K 4.7 4.1 4.3  BUN 13 11 10   CREATININE 0.5 0.5 0.5    Recent Labs  04/03/16 08/14/16 10/31/16 0729  WBC 5.0 7.1 6.4  NEUTROABS  --  63  --   HGB 10.5* 11.4* 10.7*  HCT 35* 37 35*  PLT 193 240 206   Lab Results  Component Value Date   TSH 3.14 08/14/2016   Lab Results  Component Value Date   HGBA1C 5.8 04/03/2016   Lab Results  Component Value Date   CHOL 132 12/10/2016   HDL 46 12/10/2016   LDLCALC 69 12/10/2016   TRIG 85 12/10/2016   CHOLHDL 3 05/17/2015   Assessment/Plan  GERD  Has been taking Omeprazole for > 12 months pharmacy recommends weaning off then start H2 blocker due to high risk for C-diff with long term use of PPI. D/c omeprazole then start famotidine 20 mg tablet twice daily PRN. Continue to monitor.   HTN B/p stable.Continue on amlodipine and Ramipril.On ASA for prophylaxis. Monitor BMP.   COPD Breathing stable. Afebrile.Exam findings negative for wheezes.Continue on Spiriva,albuterol and Symbicort. Continue oxygen 2 Liters nasal cannula.continue to follow up with Pulmonologist as directed.    Hyperlipidemia Continue on Lipitor 10 mg tablet. Continue to monitor fasting lipid panel periodically.   Family/ staff Communication: Reviewed plan of care with patient and facility Nurse supervisor.   Labs/tests ordered: None

## 2017-01-14 ENCOUNTER — Ambulatory Visit (INDEPENDENT_AMBULATORY_CARE_PROVIDER_SITE_OTHER): Payer: Medicare Other | Admitting: Emergency Medicine

## 2017-01-14 ENCOUNTER — Encounter: Payer: Self-pay | Admitting: Emergency Medicine

## 2017-01-14 DIAGNOSIS — J439 Emphysema, unspecified: Secondary | ICD-10-CM

## 2017-01-14 NOTE — Assessment & Plan Note (Signed)
Please continue your Spiriva and Symbicort.  Keep albuterol available to use 2 puffs up to every 4 hours if needed for shortness of breath.  Continue your oxygen as you are using it.  Continue your mucinex Handicap sticker paperwork completed Follow with Dr Lamonte Sakai in 6 months or sooner if you have any problems

## 2017-01-14 NOTE — Progress Notes (Signed)
Subjective:    Patient ID: [name], female    DOB: Feb 04, 1930, 83 y.o.   MRN: [id number]  HPI 83 year old woman, former smoker (60 pack years), with a history of COPD, also breast cancer, hypertension, GERD. She has been followed by Dr Gwenette Greet and is on Advair and Spiriva. Her dyspnea has been somewhat worse with the hot humid weather. She has a daily cough prod of white. She uses albuterol rarely. She does have some exertional wheeze. She sleeps with oxygen and with exertion. No flare ups since last time. She did suffer a fall last week to cause her to have some right flank and right hip bruising, no rib fx  ROV 11/30/15 -- follow-up visit for history of COPD, chronic hypoxemia, and frequent episodes of flaring chronic bronchitis. She was last seen in mid February in our office at which time she was diagnosed with acute exacerbation, possible pneumonia requiring hospitalization.showed left lower lobe infiltrate and her pneumococcal antigen was positive. She was discharged on 2/17 to complete a full course of antibiotics and a prednisone taper. She was started back on her Symbicort and Spiriva. She was d/c to SNF. She coughs most days, clears mucous. Occasional wheeze. No albuterol use. No flutter.   ROV 03/11/16 -- pt with hx COPD, chronic hypoxemic resp failure, recurrent exacerbations. She is still in SNF, is doing rehab exercises on her own. She gets her spiriva and symbicort reliably. She is on continuous O2 at 2L/min. Uses 3L/min with exertion. She has cough, clears thick beige mucous. She has some wheeze - rare. No flares since last time.   ROV 07/22/16 -- this follow-up visit for history of COPD, associated chronic hypoxemic respiratory failure, recurrent exacerbations. At her last visit we started guaifenesin. She reports little change compared with her last visit. She has been on Symbicort and Spiriva reliably. She has been clearing secretions more effectively.  She has some nasal  congestion, contributes to nocturnal cough.   ROV 01/14/17 -- hx COPD, chronic hypoxemia resp failure, frequent AE's. She reports today that she has ups and downs. On a bad day she has exertional dyspnea. She get SOB when supine. Also with some dizziness, ? Due to xanax or BP meds. She hears some wheeze occasionally, associated w phlegm. She coughs daily - mucinex helping with secretion clearance. She remains on spiriva and symbicort. Oxygen is at 2L/min, sometimes increases to 3L/min when she is SOB. She walks w a walker and w assistance.     Review of Systems As per HPI      Objective:   Physical Exam Vitals:   01/14/17 1229  BP: 112/72  Pulse: 76  SpO2: 97%  Weight: 139 lb 6.4 oz (63.2 kg)  Height: 5\' 5"  (1.651 m)   Gen: Pleasant, in no distress,  normal affect  ENT: No lesions,  mouth clear,  oropharynx clear, no postnasal drip  Neck: No JVD, no TMG, no carotid bruits  Lungs: No use of accessory muscles, distant, few insp squeaks, exp wheeze.   Cardiovascular: RRR, heart sounds normal, no murmur or gallops, trace ankle edema bilaterally  Musculoskeletal: No deformities, no cyanosis or clubbing  Neuro: alert, non focal  Skin: Warm, no lesions or rashes      Assessment & Plan:  COPD (chronic obstructive pulmonary disease) with emphysema ([place]) Please continue your Spiriva and Symbicort.  Keep albuterol available to use 2 puffs up to every 4 hours if needed for shortness of breath.  Continue your  oxygen as you are using it.  Continue your mucinex Handicap sticker paperwork completed Follow with Dr Lamonte Sakai in 6 months or sooner if you have any problems  Baltazar Apo, MD, PhD 01/14/2017, 1:30 PM [facility] [phone] or if no answer [phone]

## 2017-01-14 NOTE — Patient Instructions (Signed)
Please continue your Spiriva and Symbicort.  Keep albuterol available to use 2 puffs up to every 4 hours if needed for shortness of breath.  Continue your oxygen as you are using it.  Continue your mucinex Follow with Dr Lamonte Sakai in 6 months or sooner if you have any problems

## 2017-02-03 ENCOUNTER — Encounter: Payer: Self-pay | Admitting: Internal Medicine

## 2017-02-03 ENCOUNTER — Non-Acute Institutional Stay (SKILLED_NURSING_FACILITY): Payer: Medicare Other | Admitting: Internal Medicine

## 2017-02-03 DIAGNOSIS — J439 Emphysema, unspecified: Secondary | ICD-10-CM | POA: Diagnosis not present

## 2017-02-03 DIAGNOSIS — J9610 Chronic respiratory failure, unspecified whether with hypoxia or hypercapnia: Secondary | ICD-10-CM

## 2017-02-03 DIAGNOSIS — E785 Hyperlipidemia, unspecified: Secondary | ICD-10-CM | POA: Diagnosis not present

## 2017-02-03 DIAGNOSIS — I10 Essential (primary) hypertension: Secondary | ICD-10-CM | POA: Diagnosis not present

## 2017-02-03 DIAGNOSIS — K219 Gastro-esophageal reflux disease without esophagitis: Secondary | ICD-10-CM | POA: Diagnosis not present

## 2017-02-03 NOTE — Progress Notes (Signed)
Patient ID: [name], female   DOB: 1930-03-24, 83 y.o.   MRN: [id number]    LOCATION: [facility]  PCP: Blanchie Serve, MD   Code Status: Full Code  Goals of care: Advanced Directive information Advanced Directives 11/29/2016  Does Patient Have a Medical Advance Directive? Yes  Type of Paramedic of [place];Living will  Does patient want to make changes to medical advance directive? -  Copy of [place] in Chart? Yes  Would patient like information on creating a medical advance directive? -       Extended Emergency Contact Information Primary Emergency Contact: McDaniel,Cathy Address: [address]          [place], [place] [address] [place] Phone: [phone] Mobile Phone: [phone] Relation: Sister Secondary Emergency Contact: Sheilah Pigeon [place] Phone: [phone] Relation: Son   Allergies  Allergen Reactions  . Neosporin [Neomycin-Polymyxin B Gu] Itching and Rash  . Codeine Nausea Only  . Irbesartan Hives    Avapro  . Adhesive [Tape] Itching and Rash    To paper tape only. Rash and itching only where applied.    Chief Complaint  Patient presents with  . Medical Management of Chronic Issues    Routine Visit      HPI:  Patient is a 83 y.o. female seen today for routine visit. She continues to be on o2 by nasal canula for her chronic respiratory failure with copd. She has chronic cough. She denies any concern this visit. She gets around with her wheelchair.   Review of Systems:  Constitutional: Negative for fever, chills. HENT: Negative for headache, congestion, nasal discharge, sore throat, difficulty swallowing.   Eyes: Negative for blurred vision, double vision and discharge.  Respiratory: positive for chronic cough and shortness of breath. Denies wheezing. On oxygen by nasal canula.    Cardiovascular: Negative for chest pain, leg swelling.    Gastrointestinal: Negative for heartburn, nausea, vomiting, abdominal pain Genitourinary: Negative for dysuria Musculoskeletal: Negative for fall Skin: Negative for itching, rash.  Neurological: Negative for dizziness   Past Medical History:  Diagnosis Date  . Allergy   . Anxiety   . Arthritis   . Breast cancer ([place]) 04/11/06 bx   left breast invasive cductal ca  . 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 Cancer ([place])   . Cataract    extraction  . Depression   . Emphysema   . Emphysema of lung ([place])   . Esophageal reflux   . Full dentures   . History of radiation therapy 07/01/06-08/13/06   left breast  total dose 6100 cGy  . Hx of radiation therapy 08/31/12-09/28/12   right breast   . On home oxygen therapy    at night  . Osteoporosis, unspecified   . Other and unspecified hyperlipidemia   . Other emphysema ([place])   . PONV (postoperative nausea and vomiting)   . Unspecified asthma(493.90)   . Unspecified essential hypertension   . Wears glasses    Past Surgical History:  Procedure Laterality Date  . ABDOMINAL HYSTERECTOMY     partial  . BREAST LUMPECTOMY  07/13/12   right -DCIS 7 o'clock stage Tis,Nx  . BREAST SURGERY    . CATARACT EXTRACTION    . cyst on face  6 months ago  . [place]   left  . MASS EXCISION  02/18/2012  Procedure: MINOR EXCISION OF MASS;  Surgeon: Adin Hector, MD;  Location: [facility];  Service: General;  Laterality: Left;  excise 2.5 cm. mass left chest wall  . MASTECTOMY  05/14/06   lt lumpectomy-node dissDr.Leone  . PARTIAL HYSTERECTOMY       Medications: Allergies as of 02/03/2017      Reactions   Neosporin [neomycin-polymyxin B Gu] Itching, Rash   Codeine Nausea Only   Irbesartan Hives   Avapro   Adhesive [tape] Itching, Rash   To paper tape only. Rash and itching only where applied.      Medication List       Accurate as of  02/03/17  1:01 PM. Always use your most recent med list.          acetaminophen 325 MG tablet Commonly known as:  TYLENOL Take 2 tablets (650 mg total) by mouth every 6 (six) hours as needed for mild pain (or Fever >/= 101).   albuterol (2.5 MG/3ML) 0.083% nebulizer solution Commonly known as:  PROVENTIL Take 2.5 mg by nebulization every 6 (six) hours as needed for wheezing or shortness of breath.   albuterol 108 (90 Base) MCG/ACT inhaler Commonly known as:  PROVENTIL HFA Inhale 2 puffs into the lungs every 6 (six) hours as needed.   ALPRAZolam 0.25 MG tablet Commonly known as:  XANAX Take 0.25 mg by mouth daily. Give at 2 pm   ALPRAZolam 0.5 MG tablet Commonly known as:  XANAX Take 0.5 mg by mouth daily. Give at 6 am and 10 pm   amLODipine 5 MG tablet Commonly known as:  NORVASC TAKE 1 TABLET (5 MG TOTAL) BY MOUTH DAILY.   aspirin 81 MG chewable tablet Chew 81 mg by mouth daily.   atorvastatin 10 MG tablet Commonly known as:  LIPITOR Take 1/2 tablet daily for cholesterol   budesonide-formoterol 160-4.5 MCG/ACT inhaler Commonly known as:  SYMBICORT Inhale 2 puffs into the lungs 2 (two) times daily. 1 min in between each inhalation   docusate sodium 100 MG capsule Commonly known as:  COLACE Take 100 mg by mouth daily. For constipation   famotidine 20 MG tablet Commonly known as:  PEPCID Take 20 mg by mouth 2 (two) times daily as needed for heartburn or indigestion.   guaiFENesin 600 MG 12 hr tablet Commonly known as:  MUCINEX Take 600 mg by mouth 2 (two) times daily.   HYDROcodone-homatropine 5-1.5 MG/5ML syrup Commonly known as:  HYCODAN Take 5 mLs by mouth every 6 (six) hours as needed for cough.   loratadine 10 MG tablet Commonly known as:  CLARITIN Take 10 mg by mouth daily.   ondansetron 4 MG tablet Commonly known as:  ZOFRAN Take 1 tablet (4 mg total) by mouth every 6 (six) hours as needed for nausea.   OXYGEN Inhale 2 L into the lungs as needed  (with exertion to maintain SaO2 at >/ 88%).   ramipril 10 MG capsule Commonly known as:  ALTACE TAKE 1 CAPSULE (10 MG TOTAL) BY MOUTH DAILY.   simethicone 80 MG chewable tablet Commonly known as:  MYLICON Chew 80 mg by mouth every 6 (six) hours as needed for flatulence.   SPIRIVA HANDIHALER IN Inhale 2 puffs into the lungs daily.   UNABLE TO FIND Med Name: House Shake  With lunch and dinner per resident request   Vitamin D3 2000 units Tabs Take 1 tablet by mouth daily at 2 PM.       Immunizations: Immunization History  Administered Date(s) Administered  . Influenza Split 07/15/2011, 06/03/2012  . Influenza Whole 11/21/2005, 06/24/2007, 06/15/2008, 07/12/2009, 06/13/2010  . Influenza,inj,Quad PF,36+ Mos 06/23/2013, 06/16/2014, 07/25/2015, 06/21/2016  . Influenza-Unspecified 11/10/2015  . PPD Test 11/14/2015, 11/30/2015  . Pneumococcal Conjugate-13 09/10/2013  . Pneumococcal Polysaccharide-23 09/24/2003  . Pneumococcal-Unspecified 11/10/2015  . Td 09/23/2002     Physical Exam Vitals:   02/03/17 1254  BP: 132/69  Pulse: 84  Resp: 20  Temp: 98.2 F (36.8 C)  TempSrc: Oral  SpO2: 95%  Weight: 140 lb 6.4 oz (63.7 kg)  Height: 5\' 5"  (1.651 m)  Body mass index is 23.36 kg/m.   General- elderly female, frail and thin built, in no acute distress Head- normocephalic, atraumatic Nose- no maxillary or frontal sinus tenderness, no nasal discharge Throat- moist mucus membrane Eyes- PERRLA, EOMI, no pallor, no icterus Neck- no cervical lymphadenopathy Cardiovascular- normal s1,s2, no murmur, no leg edema Respiratory- bilateral poor air entry, no rhonchi, no wheeze, no crackles, not using her accessory muscles, on 2 l o2 Abdomen- bowel sounds present, soft, non tender Musculoskeletal- able to move all 4 extremities, generalized weakness, on wheelchair Neurological- alert and oriented to person, place and time Skin- warm and dry Psychiatry- normal mood and  affect   Labs reviewed: Basic Metabolic Panel:  Recent Labs  04/03/16 08/14/16 10/31/16 0729  NA 140 143 143  K 4.7 4.1 4.3  BUN 13 11 10   CREATININE 0.5 0.5 0.5   Liver Function Tests:  Recent Labs  11/14/16 0500  AST 13  ALT 9  ALKPHOS 72   No results for input(s): LIPASE, AMYLASE in the last 8760 hours. No results for input(s): AMMONIA in the last 8760 hours. CBC:  Recent Labs  04/03/16 08/14/16 10/31/16 0729  WBC 5.0 7.1 6.4  NEUTROABS  --  63  --   HGB 10.5* 11.4* 10.7*  HCT 35* 37 35*  PLT 193 240 206     Assessment/Plan  COPD No recent exacerbation. Continue symbicort and spiriva. Continue mucinex bid for cough. Continue o2 by nasal canula.   gerd Off PPI, controlled symptom, continue famotidine  Chronic respiratory failure On o2 by nasal canula. Continue mucinex 600 mg bid for chronic cough and her bronchodilator  Hypertension Stable, continue aspirin with ramipril and amlodipine  Hyperlipidemia Lipid Panel      Component Value Date/Time   CHOL 132 12/10/2016 1338   TRIG 85 12/10/2016 1338   HDL 46 12/10/2016 1338   CHOLHDL 3 05/17/2015 1522   VLDL 26.2 05/17/2015 1522   LDLCALC 69 12/10/2016 1338  LDL at goal, continue lipitor 5 mg daily    Blanchie Serve, MD Internal Medicine [facility] [place], [place] [address] Cell Phone (Monday-Friday 8 am - 5 pm): [phone] On Call: [phone] and follow prompts after 5 pm and on weekends Office Phone: [phone] Office Fax: [phone]

## 2017-02-07 ENCOUNTER — Encounter: Payer: Self-pay | Admitting: Family

## 2017-02-07 ENCOUNTER — Non-Acute Institutional Stay (SKILLED_NURSING_FACILITY): Payer: Medicare Other | Admitting: Family

## 2017-02-07 DIAGNOSIS — R05 Cough: Secondary | ICD-10-CM | POA: Diagnosis not present

## 2017-02-07 DIAGNOSIS — R059 Cough, unspecified: Secondary | ICD-10-CM

## 2017-02-07 MED ORDER — IPRATROPIUM-ALBUTEROL 0.5-2.5 (3) MG/3ML IN SOLN: 3.0000 mL | Freq: Four times a day (QID) | RESPIRATORY_TRACT | Status: AC

## 2017-02-07 NOTE — Progress Notes (Signed)
Location:  [facility] Room Number: [phone] Place of Service:  SNF [phone]) Provider:  Marlowe Sax, NP  Blanchie Serve, MD  Patient Care Team: Blanchie Serve, MD as PCP - General (Internal Medicine)  Extended Emergency Contact Information Primary Emergency Contact: McDaniel,Cathy Address: [address]          [place], [place] [address] [place] Phone: [phone] Mobile Phone: [phone] Relation: Sister Secondary Emergency Contact: Sheilah Pigeon [place] Phone: [phone] Relation: Son  Code Status:  DNR Goals of care: Advanced Directive information Advanced Directives 11/29/2016  Does Patient Have a Medical Advance Directive? Yes  Type of Paramedic of [place];Living will  Does patient want to make changes to medical advance directive? -  Copy of [place] in Chart? Yes  Would patient like information on creating a medical advance directive? -     Chief Complaint  Patient presents with  . Acute Visit    cough    HPI:  Pt is a 83 y.o. female seen today at [facility] for medical management of chronic diseases. She has a significant medical history of COPD, Depression, anxiety among other conditions. She is seen in her room today with the sister at bedside. She states not feeling well at all. She complains of coughing up pale yellow sputum. Also states having headache from coughing.   Past Medical History:  Diagnosis Date  . Allergy   . Anxiety   . Arthritis   . Breast cancer ([place]) 04/11/06 bx   left breast invasive cductal ca  . 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 Cancer ([place])   . Cataract    extraction  . Depression   . Emphysema   . Emphysema of lung ([place])   . Esophageal reflux   . Full dentures   . History of radiation therapy  07/01/06-08/13/06   left breast  total dose 6100 cGy  . Hx of radiation therapy 08/31/12-09/28/12   right breast   . On home oxygen therapy    at night  . Osteoporosis, unspecified   . Other and unspecified hyperlipidemia   . Other emphysema ([place])   . PONV (postoperative nausea and vomiting)   . Unspecified asthma(493.90)   . Unspecified essential hypertension   . Wears glasses    Past Surgical History:  Procedure Laterality Date  . ABDOMINAL HYSTERECTOMY     partial  . BREAST LUMPECTOMY  07/13/12   right -DCIS 7 o'clock stage Tis,Nx  . BREAST SURGERY    . CATARACT EXTRACTION    . cyst on face  6 months ago  . [place]   left  . MASS EXCISION  02/18/2012   Procedure: MINOR EXCISION OF MASS;  Surgeon: Adin Hector, MD;  Location: [facility];  Service: General;  Laterality: Left;  excise 2.5 cm. mass left chest wall  . MASTECTOMY  05/14/06   lt lumpectomy-node dissDr.Leone  . PARTIAL HYSTERECTOMY      Allergies  Allergen Reactions  . Neosporin [Neomycin-Polymyxin B Gu] Itching and Rash  . Codeine Nausea Only  . Irbesartan Hives    Avapro  . Adhesive [Tape] Itching and Rash    To paper tape only. Rash and itching only where applied.    Allergies as of 02/07/2017      Reactions  Neosporin [neomycin-polymyxin B Gu] Itching, Rash   Codeine Nausea Only   Irbesartan Hives   Avapro   Adhesive [tape] Itching, Rash   To paper tape only. Rash and itching only where applied.      Medication List       Accurate as of 02/07/17  3:05 PM. Always use your most recent med list.          acetaminophen 325 MG tablet Commonly known as:  TYLENOL Take 2 tablets (650 mg total) by mouth every 6 (six) hours as needed for mild pain (or Fever >/= 101).   albuterol (2.5 MG/3ML) 0.083% nebulizer solution Commonly known as:  PROVENTIL Take 2.5 mg by nebulization every 6 (six) hours as needed for wheezing or shortness of breath.   albuterol 108 (90 Base)  MCG/ACT inhaler Commonly known as:  PROVENTIL HFA Inhale 2 puffs into the lungs every 6 (six) hours as needed.   ALPRAZolam 0.25 MG tablet Commonly known as:  XANAX Take 0.25 mg by mouth daily. Give at 2 pm   ALPRAZolam 0.5 MG tablet Commonly known as:  XANAX Take 0.5 mg by mouth daily. Give at 6 am and 10 pm   amLODipine 5 MG tablet Commonly known as:  NORVASC TAKE 1 TABLET (5 MG TOTAL) BY MOUTH DAILY.   aspirin 81 MG chewable tablet Chew 81 mg by mouth daily.   atorvastatin 10 MG tablet Commonly known as:  LIPITOR Take 1/2 tablet daily for cholesterol   budesonide-formoterol 160-4.5 MCG/ACT inhaler Commonly known as:  SYMBICORT Inhale 2 puffs into the lungs 2 (two) times daily. 1 min in between each inhalation   docusate sodium 100 MG capsule Commonly known as:  COLACE Take 100 mg by mouth daily. For constipation   famotidine 20 MG tablet Commonly known as:  PEPCID Take 20 mg by mouth 2 (two) times daily as needed for heartburn or indigestion.   guaiFENesin 600 MG 12 hr tablet Commonly known as:  MUCINEX Take 600 mg by mouth 2 (two) times daily.   HYDROcodone-homatropine 5-1.5 MG/5ML syrup Commonly known as:  HYCODAN Take 5 mLs by mouth every 6 (six) hours as needed for cough.   loratadine 10 MG tablet Commonly known as:  CLARITIN Take 10 mg by mouth daily.   ondansetron 4 MG tablet Commonly known as:  ZOFRAN Take 1 tablet (4 mg total) by mouth every 6 (six) hours as needed for nausea.   OXYGEN Inhale 2 L into the lungs as needed (with exertion to maintain SaO2 at >/ 88%).   ramipril 10 MG capsule Commonly known as:  ALTACE TAKE 1 CAPSULE (10 MG TOTAL) BY MOUTH DAILY.   simethicone 80 MG chewable tablet Commonly known as:  MYLICON Chew 80 mg by mouth every 6 (six) hours as needed for flatulence.   SPIRIVA HANDIHALER IN Inhale 2 puffs into the lungs daily.   UNABLE TO FIND Med Name: House Shake  With lunch and dinner per resident request   Vitamin  D3 2000 units Tabs Take 1 tablet by mouth daily at 2 PM.       Review of Systems  Constitutional: Positive for activity change. Negative for appetite change, chills, fatigue and fever.  HENT: Negative for congestion, rhinorrhea, sinus pressure, sneezing and sore throat.   Eyes: Negative.   Respiratory: Positive for cough. Negative for chest tightness, shortness of breath and wheezing.        Continuous oxygen   Cardiovascular: Negative for chest pain, palpitations and leg  swelling.  Gastrointestinal: Negative for abdominal distention, abdominal pain, constipation, diarrhea, nausea and vomiting.  Endocrine: Negative for cold intolerance, heat intolerance, polydipsia, polyphagia and polyuria.  Genitourinary: Negative for dysuria, frequency and urgency.  Musculoskeletal: Positive for arthralgias and gait problem.  Skin: Negative for color change, pallor and rash.  Neurological: Negative for dizziness, syncope, light-headedness and headaches.  Hematological: Does not bruise/bleed easily.  Psychiatric/Behavioral: Negative for agitation, confusion, hallucinations and sleep disturbance. The patient is not nervous/anxious.     Immunization History  Administered Date(s) Administered  . Influenza Split 07/15/2011, 06/03/2012  . Influenza Whole 11/21/2005, 06/24/2007, 06/15/2008, 07/12/2009, 06/13/2010  . Influenza,inj,Quad PF,36+ Mos 06/23/2013, 06/16/2014, 07/25/2015, 06/21/2016  . Influenza-Unspecified 11/10/2015, 06/27/2016  . PPD Test 11/14/2015, 11/30/2015  . Pneumococcal Conjugate-13 09/10/2013  . Pneumococcal Polysaccharide-23 09/24/2003  . Pneumococcal-Unspecified 11/10/2015  . Td 09/23/2002   Pertinent  Health Maintenance Due  Topic Date Due  . DEXA SCAN  09/24/2023 (Originally 06/18/1997)  . INFLUENZA VACCINE  04/23/2017  . PNA vac Low Risk Adult  Completed   Fall Risk  05/17/2015 03/28/2015 07/26/2014 06/16/2014 11/23/2013  Falls in the past year? Yes No No No No  Number falls in  past yr: 1 - - - -  Injury with Fall? Yes - - - -  Risk for fall due to : - - - - Other (Comment)    Vitals:   02/07/17 1200  BP: (!) 135/57  Pulse: 66  Resp: 18  Temp: 97.6 F (36.4 C)  SpO2: 97%  Weight: 140 lb 6.4 oz (63.7 kg)  Height: 5\' 5"  (1.651 m)   Body mass index is 23.36 kg/m. Physical Exam  Constitutional: She is oriented to person, place, and time. She appears well-developed and well-nourished. No distress.  HENT:  Head: Normocephalic.  Mouth/Throat: Oropharynx is clear and moist. No oropharyngeal exudate.  HOH  Eyes: Conjunctivae and EOM are normal. Pupils are equal, round, and reactive to light. Right eye exhibits no discharge. Left eye exhibits no discharge. No scleral icterus.  Neck: Normal range of motion. Neck supple. No JVD present. No thyromegaly present.  Cardiovascular: Normal rate, regular rhythm and intact distal pulses.  Exam reveals no gallop and no friction rub.   No murmur heard. Pulmonary/Chest: Effort normal and breath sounds normal. No respiratory distress. She has no wheezes. She has no rales.  Bilateral diminished breath sounds. Oxygen 2 liters via [place] in place   Abdominal: Soft. Bowel sounds are normal. She exhibits no distension. There is no tenderness. There is no rebound and no guarding.  Genitourinary:  Genitourinary Comments: Continent  Musculoskeletal: Normal range of motion. She exhibits no edema or tenderness.  Unsteady gait.   Lymphadenopathy:    She has no cervical adenopathy.  Neurological: She is oriented to person, place, and time.  Skin: Skin is warm and dry. No rash noted. No erythema. No pallor.  Skin intact  Psychiatric: She has a normal mood and affect.    Labs reviewed:  Recent Labs  04/03/16 08/14/16 10/31/16 0729  NA 140 143 143  K 4.7 4.1 4.3  BUN 13 11 10   CREATININE 0.5 0.5 0.5    Recent Labs  04/03/16 08/14/16 10/31/16 0729  WBC 5.0 7.1 6.4  NEUTROABS  --  63  --   HGB 10.5* 11.4* 10.7*  HCT 35* 37  35*  PLT 193 240 206   Lab Results  Component Value Date   TSH 3.14 08/14/2016   Lab Results  Component Value Date  HGBA1C 5.8 04/03/2016   Lab Results  Component Value Date   CHOL 132 12/10/2016   HDL 46 12/10/2016   LDLCALC 69 12/10/2016   TRIG 85 12/10/2016   CHOLHDL 3 05/17/2015   Assessment/Plan Cough  Afebrile.Has had increased pale yellow thick sputum.Not feeling well this visit. Bilateral lung fields diminished breath sounds. Obtain portable CXR Pa/Lat rule out pneumonia.Will start on Duoneb 2.5-3 (3) mg/3 ml inhale 3 mls every 6 hours x 5 days then discontinue.continue oxygen via [place].   Family/ staff Communication: Reviewed plan of care with patient and facility Nurse supervisor.   Labs/tests ordered: portable CXR Pa/Lat rule out pneumonia

## 2017-02-11 ENCOUNTER — Encounter: Payer: Self-pay | Admitting: Adult Health

## 2017-02-11 ENCOUNTER — Ambulatory Visit (INDEPENDENT_AMBULATORY_CARE_PROVIDER_SITE_OTHER): Payer: Medicare Other | Admitting: Adult Health

## 2017-02-11 DIAGNOSIS — J439 Emphysema, unspecified: Secondary | ICD-10-CM | POA: Diagnosis not present

## 2017-02-11 DIAGNOSIS — J9611 Chronic respiratory failure with hypoxia: Secondary | ICD-10-CM | POA: Diagnosis not present

## 2017-02-11 MED ORDER — AMOXICILLIN-POT CLAVULANATE 875-125 MG PO TABS: 1.0000 | ORAL_TABLET | Freq: Two times a day (BID) | ORAL | 0 refills | Status: AC

## 2017-02-11 NOTE — Assessment & Plan Note (Signed)
Exacerbation  Says she is intolerant to steroids .   Plan  Patient Instructions  Begin Augmentin 875mg  Twice daily  For 7 days , take with food.  Mucinex DM Twice daily  As needed  Cough/congestion .  Please contact office for sooner follow up if symptoms do not improve or worsen or seek emergency care  follow up Dr. Lamonte Sakai  3-4 months and As needed

## 2017-02-11 NOTE — Patient Instructions (Addendum)
Begin Augmentin 875mg  Twice daily  For 7 days , take with food.  Mucinex DM Twice daily  As needed  Cough/congestion .  Please contact office for sooner follow up if symptoms do not improve or worsen or seek emergency care  follow up Dr. Lamonte Sakai  3-4 months and As needed

## 2017-02-11 NOTE — Progress Notes (Signed)
@Patient  ID: [name], female    DOB: [birth year], 83 y.o.   MRN: [id number]  Chief Complaint  Patient presents with  . Acute Visit    COPD     Referring provider: Blanchie Serve, MD  HPI: 83 year old female former smoker followed for COPD, chronic hypoxic respiratory failure on oxygen  02/11/2017 Acute OV : COPD /O2 RF  Patient presents for an acute office visit. Patient complains over the last week she's had increased cough, congestion, nasal  Drainage and shortness of breath. She denies any fever, chest pain, orthopnea, PND, or increased leg swelling. She remains on Symbicort and Spiriva. Remains  on oxygen 2l/m at rest and 3l/m walking .  Says she got cxr at SNF , told it was ok.  Appetite is good , no nv/d.   Patient is on Altace. Denies chronic cough.  Allergies  Allergen Reactions  . Neosporin [Neomycin-Polymyxin B Gu] Itching and Rash  . Codeine Nausea Only  . Irbesartan Hives    Avapro  . Adhesive [Tape] Itching and Rash    To paper tape only. Rash and itching only where applied.    Immunization History  Administered Date(s) Administered  . Influenza Split 07/15/2011, 06/03/2012  . Influenza Whole 11/21/2005, 06/24/2007, 06/15/2008, 07/12/2009, 06/13/2010  . Influenza,inj,Quad PF,36+ Mos 06/23/2013, 06/16/2014, 07/25/2015, 06/21/2016  . Influenza-Unspecified 11/10/2015, 06/27/2016  . PPD Test 11/14/2015, 11/30/2015  . Pneumococcal Conjugate-13 09/10/2013  . Pneumococcal Polysaccharide-23 09/24/2003  . Pneumococcal-Unspecified 11/10/2015  . Td 09/23/2002    Past Medical History:  Diagnosis Date  . Allergy   . Anxiety   . Arthritis   . Breast cancer ([place]) 04/11/06 bx   left breast invasive cductal ca  . 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 Cancer ([place])   . Cataract    extraction  . Depression   . Emphysema   . Emphysema of lung ([place])   . Esophageal reflux   . Full  dentures   . History of radiation therapy 07/01/06-08/13/06   left breast  total dose 6100 cGy  . Hx of radiation therapy 08/31/12-09/28/12   right breast   . On home oxygen therapy    at night  . Osteoporosis, unspecified   . Other and unspecified hyperlipidemia   . Other emphysema ([place])   . PONV (postoperative nausea and vomiting)   . Unspecified asthma(493.90)   . Unspecified essential hypertension   . Wears glasses     Tobacco History: History  Smoking Status  . Former Smoker  . Packs/day: 1.50  . Years: 43.00  . Types: Cigarettes  . Start date: 11/23/1953  . Quit date: 09/24/1995  Smokeless Tobacco  . Never Used    Comment: quit 16 years ago   Counseling given: Not Answered   Outpatient Encounter Prescriptions as of 02/11/2017  Medication Sig  . acetaminophen (TYLENOL) 325 MG tablet Take 2 tablets (650 mg total) by mouth every 6 (six) hours as needed for mild pain (or Fever >/= 101).  Marland Kitchen albuterol (PROVENTIL HFA) 108 (90 BASE) MCG/ACT inhaler Inhale 2 puffs into the lungs every 6 (six) hours as needed.  Marland Kitchen albuterol (PROVENTIL) (2.5 MG/3ML) 0.083% nebulizer solution Take 2.5 mg by nebulization every 6 (six) hours as needed for wheezing or shortness of breath.   . ALPRAZolam (XANAX) 0.25 MG tablet Take 0.25 mg by mouth daily. Give at 2 pm  . ALPRAZolam (XANAX) 0.5 MG tablet Take 0.5 mg  by mouth daily. Give at 6 am and 10 pm  . amLODipine (NORVASC) 5 MG tablet TAKE 1 TABLET (5 MG TOTAL) BY MOUTH DAILY.  Marland Kitchen amoxicillin-clavulanate (AUGMENTIN) 875-125 MG tablet Take 1 tablet by mouth 2 (two) times daily.  Marland Kitchen aspirin 81 MG chewable tablet Chew 81 mg by mouth daily.  Marland Kitchen atorvastatin (LIPITOR) 10 MG tablet Take 1/2 tablet daily for cholesterol   . budesonide-formoterol (SYMBICORT) 160-4.5 MCG/ACT inhaler Inhale 2 puffs into the lungs 2 (two) times daily. 1 min in between each inhalation  . Cholecalciferol (VITAMIN D3) 2000 units TABS Take 1 tablet by mouth daily at 2 PM.  . docusate  sodium (COLACE) 100 MG capsule Take 100 mg by mouth daily. For constipation  . famotidine (PEPCID) 20 MG tablet Take 20 mg by mouth 2 (two) times daily as needed for heartburn or indigestion.  Marland Kitchen guaiFENesin (MUCINEX) 600 MG 12 hr tablet Take 600 mg by mouth 2 (two) times daily.  Marland Kitchen HYDROcodone-homatropine (HYCODAN) 5-1.5 MG/5ML syrup Take 5 mLs by mouth every 6 (six) hours as needed for cough.  . loratadine (CLARITIN) 10 MG tablet Take 10 mg by mouth daily.  . ondansetron (ZOFRAN) 4 MG tablet Take 1 tablet (4 mg total) by mouth every 6 (six) hours as needed for nausea.  . OXYGEN Inhale 2 L into the lungs as needed (with exertion to maintain SaO2 at >/ 88%).  . ramipril (ALTACE) 10 MG capsule TAKE 1 CAPSULE (10 MG TOTAL) BY MOUTH DAILY.  . simethicone (MYLICON) 80 MG chewable tablet Chew 80 mg by mouth every 6 (six) hours as needed for flatulence.  . Tiotropium Bromide Monohydrate (SPIRIVA HANDIHALER IN) Inhale 2 puffs into the lungs daily.  Marland Kitchen UNABLE TO FIND Med Name: House Shake  With lunch and dinner per resident request   No facility-administered encounter medications on file as of 02/11/2017.      Review of Systems  Constitutional:   No  weight loss, night sweats,  Fevers, chills, fatigue, or  lassitude.  HEENT:   No headaches,  Difficulty swallowing,  Tooth/dental problems, or  Sore throat,                No sneezing, itching, ear ache, + nasal congestion, post nasal drip,   CV:  No chest pain,  Orthopnea, PND, swelling in lower extremities, anasarca, dizziness, palpitations, syncope.   GI  No heartburn, indigestion, abdominal pain, nausea, vomiting, diarrhea, change in bowel habits, loss of appetite, bloody stools.   Resp:    No chest wall deformity  Skin: no rash or lesions.  GU: no dysuria, change in color of urine, no urgency or frequency.  No flank pain, no hematuria   MS:  No joint pain or swelling.  No decreased range of motion.  No back pain.    Physical Exam  BP (!)  126/58 (BP Location: Right Arm, Cuff Size: Normal)   Pulse 86   Temp 98.5 F (36.9 C) (Oral)   SpO2 94%    GEN: A/Ox3; pleasant , NAD, chronically ill-appearing on oxygen in wheelchair   HEENT:  [place]/AT,  EACs-clear, TMs-wnl, NOSE-clear, THROAT-clear, no lesions, no postnasal drip or exudate noted.   NECK:  Supple w/ fair ROM; no JVD; normal carotid impulses w/o bruits; no thyromegaly or nodules palpated; no lymphadenopathy.    RESP  diminished breath sounds in the bases  w/o, wheezes/ rales/ or rhonchi. no accessory muscle use, no dullness to percussion  CARD:  RRR, no m/r/g, tr  to none  peripheral edema, pulses intact, no cyanosis or clubbing.  GI:   Soft & nt; nml bowel sounds; no organomegaly or masses detected.   Musco: Warm bil, no deformities or joint swelling noted.   Neuro: alert, no focal deficits noted.    Skin: Warm, no lesions or rashes   Lab Results:  CBC  BNP No results found for: BNP  ProBNP No results found for: PROBNP  Imaging: No results found.   Assessment & Plan:   COPD (chronic obstructive pulmonary disease) with emphysema ([place]) Exacerbation  Says she is intolerant to steroids .   Plan  Patient Instructions  Begin Augmentin 875mg  Twice daily  For 7 days , take with food.  Mucinex DM Twice daily  As needed  Cough/congestion .  Please contact office for sooner follow up if symptoms do not improve or worsen or seek emergency care  follow up Dr. Lamonte Sakai  3-4 months and As needed      Chronic respiratory failure with hypoxia ([place]) Cont on O2.      Rexene Edison, NP 02/11/2017

## 2017-02-11 NOTE — Assessment & Plan Note (Signed)
Cont on O2 .  

## 2017-02-13 ENCOUNTER — Non-Acute Institutional Stay (SKILLED_NURSING_FACILITY): Payer: Medicare Other

## 2017-02-13 DIAGNOSIS — Z0000 Encounter for general adult medical examination without abnormal findings: Secondary | ICD-10-CM | POA: Diagnosis not present

## 2017-02-13 NOTE — Progress Notes (Signed)
Quick Notes   Health Maintenance: DEXA due, order if needed     Abnormal Screen:6 CIT-2     Patient Concerns: None     Nurse Concerns: None

## 2017-02-13 NOTE — Progress Notes (Signed)
Subjective:   [name] is a 83 y.o. female who presents for Medicare Annual (Subsequent) preventive examination at [facility]     Objective:     Vitals: BP (!) 140/58 (BP Location: Right Arm, Patient Position: Sitting)   Pulse 87   Temp 97.9 F (36.6 C) (Oral)   Ht 5\' 5"  (1.651 m)   Wt 140 lb (63.5 kg)   SpO2 90%   BMI 23.30 kg/m   Body mass index is 23.3 kg/m.   Tobacco History  Smoking Status  . Former Smoker  . Packs/day: 1.50  . Years: 43.00  . Types: Cigarettes  . Start date: 11/23/1953  . Quit date: 09/24/1995  Smokeless Tobacco  . Never Used    Comment: quit 16 years ago     Counseling given: Not Answered   Past Medical History:  Diagnosis Date  . Allergy   . Anxiety   . Arthritis   . Breast cancer ([place]) 04/11/06 bx   left breast invasive cductal ca  . 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 Cancer ([place])   . Cataract    extraction  . Depression   . Emphysema   . Emphysema of lung ([place])   . Esophageal reflux   . Full dentures   . History of radiation therapy 07/01/06-08/13/06   left breast  total dose 6100 cGy  . Hx of radiation therapy 08/31/12-09/28/12   right breast   . On home oxygen therapy    at night  . Osteoporosis, unspecified   . Other and unspecified hyperlipidemia   . Other emphysema ([place])   . PONV (postoperative nausea and vomiting)   . Unspecified asthma(493.90)   . Unspecified essential hypertension   . Wears glasses    Past Surgical History:  Procedure Laterality Date  . ABDOMINAL HYSTERECTOMY     partial  . BREAST LUMPECTOMY  07/13/12   right -DCIS 7 o'clock stage Tis,Nx  . BREAST SURGERY    . CATARACT EXTRACTION    . cyst on face  6 months ago  . [place]   left  . MASS EXCISION  02/18/2012   Procedure: MINOR EXCISION OF MASS;  Surgeon: Adin Hector, MD;  Location: [facility];  Service: General;   Laterality: Left;  excise 2.5 cm. mass left chest wall  . MASTECTOMY  05/14/06   lt lumpectomy-node dissDr.Leone  . PARTIAL HYSTERECTOMY     Family History  Problem Relation Age of Onset  . Coronary artery disease Mother   . Diabetes Father   . Cancer Father        prostate cancer  . Breast cancer Sister   . Cancer Sister   . Kidney cancer Brother   . COPD Unknown    History  Sexual Activity  . Sexual activity: No    Outpatient Encounter Prescriptions as of 02/13/2017  Medication Sig  . acetaminophen (TYLENOL) 325 MG tablet Take 2 tablets (650 mg total) by mouth every 6 (six) hours as needed for mild pain (or Fever >/= 101).  Marland Kitchen albuterol (PROVENTIL HFA) 108 (90 BASE) MCG/ACT inhaler Inhale 2 puffs into the lungs every 6 (six) hours as needed.  Marland Kitchen albuterol (PROVENTIL) (2.5 MG/3ML) 0.083% nebulizer solution Take 2.5 mg by nebulization every 6 (six) hours as needed for wheezing or shortness of breath.   . ALPRAZolam (XANAX) 0.25 MG tablet Take 0.25 mg  by mouth daily. Give at 2 pm  . ALPRAZolam (XANAX) 0.5 MG tablet Take 0.5 mg by mouth daily. Give at 6 am and 10 pm  . amLODipine (NORVASC) 5 MG tablet TAKE 1 TABLET (5 MG TOTAL) BY MOUTH DAILY.  Marland Kitchen amoxicillin-clavulanate (AUGMENTIN) 875-125 MG tablet Take 1 tablet by mouth 2 (two) times daily.  Marland Kitchen aspirin 81 MG chewable tablet Chew 81 mg by mouth daily.  Marland Kitchen atorvastatin (LIPITOR) 10 MG tablet Take 1/2 tablet daily for cholesterol   . budesonide-formoterol (SYMBICORT) 160-4.5 MCG/ACT inhaler Inhale 2 puffs into the lungs 2 (two) times daily. 1 min in between each inhalation  . Cholecalciferol (VITAMIN D3) 2000 units TABS Take 1 tablet by mouth daily at 2 PM.  . docusate sodium (COLACE) 100 MG capsule Take 100 mg by mouth daily. For constipation  . famotidine (PEPCID) 20 MG tablet Take 20 mg by mouth 2 (two) times daily as needed for heartburn or indigestion.  Marland Kitchen guaiFENesin (MUCINEX) 600 MG 12 hr tablet Take 600 mg by mouth 2 (two) times  daily.  Marland Kitchen HYDROcodone-homatropine (HYCODAN) 5-1.5 MG/5ML syrup Take 5 mLs by mouth every 6 (six) hours as needed for cough.  . loratadine (CLARITIN) 10 MG tablet Take 10 mg by mouth daily.  . ondansetron (ZOFRAN) 4 MG tablet Take 1 tablet (4 mg total) by mouth every 6 (six) hours as needed for nausea.  . OXYGEN Inhale 2 L into the lungs as needed (with exertion to maintain SaO2 at >/ 88%).  . ramipril (ALTACE) 10 MG capsule TAKE 1 CAPSULE (10 MG TOTAL) BY MOUTH DAILY.  . simethicone (MYLICON) 80 MG chewable tablet Chew 80 mg by mouth every 6 (six) hours as needed for flatulence.  . Tiotropium Bromide Monohydrate (SPIRIVA HANDIHALER IN) Inhale 2 puffs into the lungs daily.  Marland Kitchen UNABLE TO FIND Med Name: House Shake  With lunch and dinner per resident request   No facility-administered encounter medications on file as of 02/13/2017.     Activities of Daily Living In your present state of health, do you have any difficulty performing the following activities: 02/13/2017  Hearing? N  Vision? N  Difficulty concentrating or making decisions? N  Walking or climbing stairs? Y  Dressing or bathing? N  Doing errands, shopping? Y  Preparing Food and eating ? Y  Using the Toilet? N  In the past six months, have you accidently leaked urine? N  Do you have problems with loss of bowel control? N  Managing your Medications? Y  Managing your Finances? Y  Some recent data might be hidden    Patient Care Team: Blanchie Serve, MD as PCP - General (Internal Medicine)    Assessment:     Exercise Activities and Dietary recommendations Current Exercise Habits: Home exercise routine, Type of exercise: stretching, Time (Minutes): 20, Frequency (Times/Week): 4, Weekly Exercise (Minutes/Week): 80, Intensity: Mild, Exercise limited by: None identified  Goals    . Start walking again          Pt would like to start walking again once her bronchitis is gone.      Fall Risk Fall Risk  02/13/2017 05/17/2015  03/28/2015 07/26/2014 06/16/2014  Falls in the past year? No Yes No No No  Number falls in past yr: - 1 - - -  Injury with Fall? - Yes - - -  Risk for fall due to : - - - - -   Depression Screen PHQ 2/9 Scores 02/13/2017 05/17/2015 06/16/2014 11/23/2013  PHQ -  2 Score 0 0 0 0     Cognitive Function     6CIT Screen 02/13/2017  What Year? 0 points  What month? 0 points  What time? 0 points  Count back from 20 0 points  Months in reverse 0 points  Repeat phrase 2 points  Total Score 2    Immunization History  Administered Date(s) Administered  . Influenza Split 07/15/2011, 06/03/2012  . Influenza Whole 11/21/2005, 06/24/2007, 06/15/2008, 07/12/2009, 06/13/2010  . Influenza,inj,Quad PF,36+ Mos 06/23/2013, 06/16/2014, 07/25/2015, 06/21/2016  . Influenza-Unspecified 11/10/2015, 06/27/2016  . PPD Test 11/14/2015, 11/30/2015  . Pneumococcal Conjugate-13 09/10/2013  . Pneumococcal Polysaccharide-23 09/24/2003  . Pneumococcal-Unspecified 11/10/2015  . Td 09/23/2002   Screening Tests Health Maintenance  Topic Date Due  . DEXA SCAN  09/24/2023 (Originally 06/18/1997)  . INFLUENZA VACCINE  04/23/2017  . TETANUS/TDAP  07/25/2023  . PNA vac Low Risk Adult  Completed      Plan:    I have personally reviewed and addressed the Medicare Annual Wellness questionnaire and have noted the following in the patient's chart:  A. Medical and social history B. Use of alcohol, tobacco or illicit drugs  C. Current medications and supplements D. Functional ability and status E.  Nutritional status F.  Physical activity G. Advance directives H. List of other physicians I.  Hospitalizations, surgeries, and ER visits in previous 12 months J.  [place] to include hearing, vision, cognitive, depression L. Referrals and appointments - none  In addition, I have reviewed and discussed with patient certain preventive protocols, quality metrics, and best practice recommendations. A written  personalized care plan for preventive services as well as general preventive health recommendations were provided to patient.  See attached scanned questionnaire for additional information.   Signed,   Rich Reining, RN Nurse Health Advisor

## 2017-02-13 NOTE — Patient Instructions (Signed)
[name] , Thank you for taking time to come for your Medicare Wellness Visit. I appreciate your ongoing commitment to your health goals. Please review the following plan we discussed and let me know if I can assist you in the future.   Screening recommendations/referrals: Colonoscopy -pt over age 81 Mammogram- pt over age 17 Bone Density due Recommended yearly ophthalmology/optometry visit for glaucoma screening and checkup Recommended yearly dental visit for hygiene and checkup  Vaccinations: Influenza vaccine due10/01/2017 Pneumococcal vaccine up to date Tdap vaccine due. Shingles vaccine not in records  Advanced directives: In Chart  Conditions/risks identified: none  Next appointment: none upcoming   Preventive Care 3 Years and Older, Female Preventive care refers to lifestyle choices and visits with your health care provider that can promote health and wellness. What does preventive care include?  A yearly physical exam. This is also called an annual well check.  Dental exams once or twice a year.  Routine eye exams. Ask your health care provider how often you should have your eyes checked.  Personal lifestyle choices, including:  Daily care of your teeth and gums.  Regular physical activity.  Eating a healthy diet.  Avoiding tobacco and drug use.  Limiting alcohol use.  Practicing safe sex.  Taking low-dose aspirin every day.  Taking vitamin and mineral supplements as recommended by your health care provider. What happens during an annual well check? The services and screenings done by your health care provider during your annual well check will depend on your age, overall health, lifestyle risk factors, and family history of disease. Counseling  Your health care provider may ask you questions about your:  Alcohol use.  Tobacco use.  Drug use.  Emotional well-being.  Home and relationship well-being.  Sexual activity.  Eating  habits.  History of falls.  Memory and ability to understand (cognition).  Work and work Statistician.  Reproductive health. Screening  You may have the following tests or measurements:  Height, weight, and BMI.  Blood pressure.  Lipid and cholesterol levels. These may be checked every 5 years, or more frequently if you are over 41 years old.  Skin check.  Lung cancer screening. You may have this screening every year starting at age 36 if you have a 30-pack-year history of smoking and currently smoke or have quit within the past 15 years.  Fecal occult blood test (FOBT) of the stool. You may have this test every year starting at age 64.  Flexible sigmoidoscopy or colonoscopy. You may have a sigmoidoscopy every 5 years or a colonoscopy every 10 years starting at age 65.  Hepatitis C blood test.  Hepatitis B blood test.  Sexually transmitted disease (STD) testing.  Diabetes screening. This is done by checking your blood sugar (glucose) after you have not eaten for a while (fasting). You may have this done every 1-3 years.  Bone density scan. This is done to screen for osteoporosis. You may have this done starting at age 57.  Mammogram. This may be done every 1-2 years. Talk to your health care provider about how often you should have regular mammograms. Talk with your health care provider about your test results, treatment options, and if necessary, the need for more tests. Vaccines  Your health care provider may recommend certain vaccines, such as:  Influenza vaccine. This is recommended every year.  Tetanus, diphtheria, and acellular pertussis (Tdap, Td) vaccine. You may need a Td booster every 10 years.  Zoster vaccine. You may need this  after age 81.  Pneumococcal 13-valent conjugate (PCV13) vaccine. One dose is recommended after age 55.  Pneumococcal polysaccharide (PPSV23) vaccine. One dose is recommended after age 8. Talk to your health care provider about which  screenings and vaccines you need and how often you need them. This information is not intended to replace advice given to you by your health care provider. Make sure you discuss any questions you have with your health care provider. Document Released: 10/06/2015 Document Revised: 05/29/2016 Document Reviewed: 07/11/2015 Elsevier Interactive Patient Education  2017 [place] Prevention in the Home Falls can cause injuries. They can happen to people of all ages. There are many things you can do to make your home safe and to help prevent falls. What can I do on the outside of my home?  Regularly fix the edges of walkways and driveways and fix any cracks.  Remove anything that might make you trip as you walk through a door, such as a raised step or threshold.  Trim any bushes or trees on the path to your home.  Use bright outdoor lighting.  Clear any walking paths of anything that might make someone trip, such as rocks or tools.  Regularly check to see if handrails are loose or broken. Make sure that both sides of any steps have handrails.  Any raised decks and porches should have guardrails on the edges.  Have any leaves, snow, or ice cleared regularly.  Use sand or salt on walking paths during winter.  Clean up any spills in your garage right away. This includes oil or grease spills. What can I do in the bathroom?  Use night lights.  Install grab bars by the toilet and in the tub and shower. Do not use towel bars as grab bars.  Use non-skid mats or decals in the tub or shower.  If you need to sit down in the shower, use a plastic, non-slip stool.  Keep the floor dry. Clean up any water that spills on the floor as soon as it happens.  Remove soap buildup in the tub or shower regularly.  Attach bath mats securely with double-sided non-slip rug tape.  Do not have throw rugs and other things on the floor that can make you trip. What can I do in the bedroom?  Use  night lights.  Make sure that you have a light by your bed that is easy to reach.  Do not use any sheets or blankets that are too big for your bed. They should not hang down onto the floor.  Have a firm chair that has side arms. You can use this for support while you get dressed.  Do not have throw rugs and other things on the floor that can make you trip. What can I do in the kitchen?  Clean up any spills right away.  Avoid walking on wet floors.  Keep items that you use a lot in easy-to-reach places.  If you need to reach something above you, use a strong step stool that has a grab bar.  Keep electrical cords out of the way.  Do not use floor polish or wax that makes floors slippery. If you must use wax, use non-skid floor wax.  Do not have throw rugs and other things on the floor that can make you trip. What can I do with my stairs?  Do not leave any items on the stairs.  Make sure that there are handrails on both sides of the  stairs and use them. Fix handrails that are broken or loose. Make sure that handrails are as long as the stairways.  Check any carpeting to make sure that it is firmly attached to the stairs. Fix any carpet that is loose or worn.  Avoid having throw rugs at the top or bottom of the stairs. If you do have throw rugs, attach them to the floor with carpet tape.  Make sure that you have a light switch at the top of the stairs and the bottom of the stairs. If you do not have them, ask someone to add them for you. What else can I do to help prevent falls?  Wear shoes that:  Do not have high heels.  Have rubber bottoms.  Are comfortable and fit you well.  Are closed at the toe. Do not wear sandals.  If you use a stepladder:  Make sure that it is fully opened. Do not climb a closed stepladder.  Make sure that both sides of the stepladder are locked into place.  Ask someone to hold it for you, if possible.  Clearly mark and make sure that you  can see:  Any grab bars or handrails.  First and last steps.  Where the edge of each step is.  Use tools that help you move around (mobility aids) if they are needed. These include:  Canes.  Walkers.  Scooters.  Crutches.  Turn on the lights when you go into a dark area. Replace any light bulbs as soon as they burn out.  Set up your furniture so you have a clear path. Avoid moving your furniture around.  If any of your floors are uneven, fix them.  If there are any pets around you, be aware of where they are.  Review your medicines with your doctor. Some medicines can make you feel dizzy. This can increase your chance of falling. Ask your doctor what other things that you can do to help prevent falls. This information is not intended to replace advice given to you by your health care provider. Make sure you discuss any questions you have with your health care provider. Document Released: 07/06/2009 Document Revised: 02/15/2016 Document Reviewed: 10/14/2014 Elsevier Interactive Patient Education  2017 [employer].

## 2017-03-21 DIAGNOSIS — I10 Essential (primary) hypertension: Secondary | ICD-10-CM | POA: Diagnosis not present

## 2017-03-21 DIAGNOSIS — J449 Chronic obstructive pulmonary disease, unspecified: Secondary | ICD-10-CM | POA: Diagnosis not present

## 2017-03-21 DIAGNOSIS — J189 Pneumonia, unspecified organism: Secondary | ICD-10-CM | POA: Diagnosis not present

## 2017-03-21 DIAGNOSIS — K219 Gastro-esophageal reflux disease without esophagitis: Secondary | ICD-10-CM | POA: Diagnosis not present

## 2017-05-02 DIAGNOSIS — M1990 Unspecified osteoarthritis, unspecified site: Secondary | ICD-10-CM | POA: Diagnosis not present

## 2017-05-02 DIAGNOSIS — J449 Chronic obstructive pulmonary disease, unspecified: Secondary | ICD-10-CM | POA: Diagnosis not present

## 2017-05-02 DIAGNOSIS — J189 Pneumonia, unspecified organism: Secondary | ICD-10-CM | POA: Diagnosis not present

## 2017-05-02 DIAGNOSIS — I10 Essential (primary) hypertension: Secondary | ICD-10-CM | POA: Diagnosis not present

## 2017-05-20 ENCOUNTER — Ambulatory Visit: Payer: Medicare Other | Admitting: Emergency Medicine

## 2017-05-22 ENCOUNTER — Encounter: Payer: Self-pay | Admitting: Emergency Medicine

## 2017-05-22 ENCOUNTER — Ambulatory Visit (INDEPENDENT_AMBULATORY_CARE_PROVIDER_SITE_OTHER): Payer: Medicare Other | Admitting: Emergency Medicine

## 2017-05-22 DIAGNOSIS — Z23 Encounter for immunization: Secondary | ICD-10-CM

## 2017-05-22 DIAGNOSIS — J439 Emphysema, unspecified: Secondary | ICD-10-CM

## 2017-05-22 DIAGNOSIS — J9611 Chronic respiratory failure with hypoxia: Secondary | ICD-10-CM | POA: Diagnosis not present

## 2017-05-22 MED ORDER — FLUTICASONE-UMECLIDIN-VILANT 100-62.5-25 MCG/INH IN AEPB: 1.0000 | INHALATION_SPRAY | Freq: Every day | RESPIRATORY_TRACT | 0 refills | Status: AC

## 2017-05-22 NOTE — Assessment & Plan Note (Signed)
We will stop spiriva and symbicort temporarily.  We will start Trelegy one inhalation once a day. Continue this for 3-4 weeks to see if you benefit. If so then we will continue it.  Use albuterol nebulized as needed for shortness of breath or coughing.  Flu shot today.  Follow with Dr Lamonte Sakai in 4 months or sooner if you have any problems.

## 2017-05-22 NOTE — Patient Instructions (Signed)
We will stop spiriva and symbicort temporarily.  We will start Trelegy one inhalation once a day. Continue this for 3-4 weeks to see if you benefit. If so then we will continue it.  Use albuterol nebulized as needed for shortness of breath or coughing.  Continue your oxygen at 2-3L/min.  Flu shot today.  Follow with Dr Lamonte Sakai in 4 months or sooner if you have any problems.

## 2017-05-22 NOTE — Progress Notes (Signed)
Subjective:    Patient ID: [name], female    DOB: Jun 24, 1930, 83 y.o.   MRN: [id number]  HPI 83 year old woman, former smoker (60 pack years), with a history of COPD, also breast cancer, hypertension, GERD. She has been followed by Dr Gwenette Greet and is on Advair and Spiriva. Her dyspnea has been somewhat worse with the hot humid weather. She has a daily cough prod of white. She uses albuterol rarely. She does have some exertional wheeze. She sleeps with oxygen and with exertion. No flare ups since last time. She did suffer a fall last week to cause her to have some right flank and right hip bruising, no rib fx  ROV 11/30/15 -- follow-up visit for history of COPD, chronic hypoxemia, and frequent episodes of flaring chronic bronchitis. She was last seen in mid February in our office at which time she was diagnosed with acute exacerbation, possible pneumonia requiring hospitalization.showed left lower lobe infiltrate and her pneumococcal antigen was positive. She was discharged on 2/17 to complete a full course of antibiotics and a prednisone taper. She was started back on her Symbicort and Spiriva. She was d/c to SNF. She coughs most days, clears mucous. Occasional wheeze. No albuterol use. No flutter.   ROV 03/11/16 -- pt with hx COPD, chronic hypoxemic resp failure, recurrent exacerbations. She is still in SNF, is doing rehab exercises on her own. She gets her spiriva and symbicort reliably. She is on continuous O2 at 2L/min. Uses 3L/min with exertion. She has cough, clears thick beige mucous. She has some wheeze - rare. No flares since last time.   ROV 07/22/16 -- this follow-up visit for history of COPD, associated chronic hypoxemic respiratory failure, recurrent exacerbations. At her last visit we started guaifenesin. She reports little change compared with her last visit. She has been on Symbicort and Spiriva reliably. She has been clearing secretions more effectively.  She has some nasal  congestion, contributes to nocturnal cough.   ROV 01/14/17 -- hx COPD, chronic hypoxemia resp failure, frequent AE's. She reports today that she has ups and downs. On a bad day she has exertional dyspnea. She get SOB when supine. Also with some dizziness, ? Due to xanax or BP meds. She hears some wheeze occasionally, associated w phlegm. She coughs daily - mucinex helping with secretion clearance. She remains on spiriva and symbicort. Oxygen is at 2L/min, sometimes increases to 3L/min when she is SOB. She walks w a walker and w assistance.   ROV 05/22/17 -- patient follows up today for severe COPD, associated chronic hypoxemic respiratory failure. Her course is been complicated by frequent acute exacerbations.  She was treated for an acute flare in our office 02/11/17. Was treated with levaquin a couple weeks ago at her SNF - light headed and weak. More dyspnea. She now feels back close to baseline. Overall she acknowledges that she is more SOB over the last year. She is on Symbicort, Spiriva.  She is using albuterol rarely.     Review of Systems As per HPI      Objective:   Physical Exam Vitals:   05/22/17 1326 05/22/17 1327  BP:  132/70  Pulse:  82  SpO2:  97%  Weight: 141 lb (64 kg)   Height: 5' 5.5" (1.664 m)    Gen: Pleasant, in no distress,  normal affect  ENT: No lesions,  mouth clear,  oropharynx clear, no postnasal drip  Neck: No JVD, no TMG, no carotid bruits  Lungs:  No use of accessory muscles, distant, few insp squeaks, exp wheeze.   Cardiovascular: RRR, heart sounds normal, no murmur or gallops, trace ankle edema bilaterally  Musculoskeletal: No deformities, no cyanosis or clubbing  Neuro: alert, non focal  Skin: Warm, no lesions or rashes      Assessment & Plan:  COPD (chronic obstructive pulmonary disease) with emphysema (HCC) We will stop spiriva and symbicort temporarily.  We will start Trelegy one inhalation once a day. Continue this for 3-4 weeks to see if  you benefit. If so then we will continue it.  Use albuterol nebulized as needed for shortness of breath or coughing.  Flu shot today.  Follow with Dr Lamonte Sakai in 4 months or sooner if you have any problems.  Chronic respiratory failure with hypoxia (HCC) Continue your oxygen at 2-3L/min.   Baltazar Apo, MD, PhD 05/22/2017, 1:50 PM [facility] [phone] or if no answer [phone]

## 2017-05-22 NOTE — Assessment & Plan Note (Signed)
Continue your oxygen at 2-3 L/min 

## 2017-05-23 DIAGNOSIS — J189 Pneumonia, unspecified organism: Secondary | ICD-10-CM | POA: Diagnosis not present

## 2017-05-23 DIAGNOSIS — Z7409 Other reduced mobility: Secondary | ICD-10-CM | POA: Diagnosis not present

## 2017-05-23 DIAGNOSIS — J449 Chronic obstructive pulmonary disease, unspecified: Secondary | ICD-10-CM | POA: Diagnosis not present

## 2017-05-23 DIAGNOSIS — I10 Essential (primary) hypertension: Secondary | ICD-10-CM | POA: Diagnosis not present

## 2017-09-02 ENCOUNTER — Ambulatory Visit: Payer: Medicare Other | Admitting: Emergency Medicine

## 2017-09-04 ENCOUNTER — Encounter: Payer: Self-pay | Admitting: Emergency Medicine

## 2017-09-04 ENCOUNTER — Ambulatory Visit (INDEPENDENT_AMBULATORY_CARE_PROVIDER_SITE_OTHER): Payer: Medicare Other | Admitting: Emergency Medicine

## 2017-09-04 DIAGNOSIS — J439 Emphysema, unspecified: Secondary | ICD-10-CM | POA: Diagnosis not present

## 2017-09-04 DIAGNOSIS — J9611 Chronic respiratory failure with hypoxia: Secondary | ICD-10-CM | POA: Diagnosis not present

## 2017-09-04 DIAGNOSIS — J301 Allergic rhinitis due to pollen: Secondary | ICD-10-CM

## 2017-09-04 NOTE — Progress Notes (Signed)
    Subjective:    Patient ID: [name], female    DOB: 02/03/1930, 83 y.o.   MRN: [id number]  HPI  ROV 09/04/17 --follow-up visit for 83 year old woman with a history of tobacco use (60 pack years) and associated COPD with chronic hypoxemic respiratory failure.  She also has a history of breast cancer, hypertension, GERD. She notes that she has ups and downs with her breathing - some days she notes more dyspnea, depending on her exertion. Also feels it when she lays down at night. No pred or abx since last visit. We changed her to Trelegy - she is tolerating, feels that she is benefiting. She has increased nasal gtt and congestion. She uses O2 at 2-3 l/min. She is on loratadine, doesn't feel that she needs any additional allergy meds.     Review of Systems As per HPI      Objective:   Physical Exam Vitals:   09/04/17 0908 09/04/17 0909  BP:  122/70  Pulse:  76  SpO2:  95%  Weight: 144 lb (65.3 kg)   Height: 5\' 5"  (1.651 m)    Gen: Pleasant, in no distress,  normal affect  ENT: No lesions,  mouth clear,  oropharynx clear, no postnasal drip  Neck: No JVD, no TMG, no carotid bruits  Lungs: No use of accessory muscles, distant, few insp squeaks, exp wheeze.   Cardiovascular: RRR, heart sounds normal, no murmur or gallops, trace ankle edema bilaterally  Musculoskeletal: No deformities, no cyanosis or clubbing  Neuro: alert, non focal  Skin: Warm, no lesions or rashes      Assessment & Plan:  COPD (chronic obstructive pulmonary disease) with emphysema (HCC) She tolerated the change to Trelegy, actually prefers it.  We will continue.  We discussed her current oxygen use as she increases to 3 L/min with dyspnea and significant exertion.  I agree with this plan.  No exacerbations.  Flu shot up-to-date  Continue Trelegy 1 inhalation once a day.  Remember to rinse and gargle after using this medication. Keep albuterol available to use 2 puffs if needed for shortness of  breath, chest tightness, wheezing. Your oxygen should be used at all times at 2-3 L/min.  Increase to 3 L/min when you are having more shortness of breath or when you are doing significant exertion. Flu shot up-to-date Pneumonia shots up-to-date Follow with Dr Lamonte Sakai in 4 months or sooner if you have any problems  Chronic respiratory failure with hypoxia ([place]) Oxygen orders amended to reflect 2 L/min at rest and 3 L/min with exertion and when she is experiencing increased dyspnea.  Allergic rhinitis Discussed symptoms with her today.  She has increased nasal drainage especially in the mornings.  She is able to clear it adequately.  I offered to add an additional allergy medication to her regimen  but she believes that the loratadine is adequate.  We will leave the medicines as they are for now.  Baltazar Apo, MD, PhD 09/04/2017, 9:23 AM [facility] [phone] or if no answer [phone]

## 2017-09-04 NOTE — Assessment & Plan Note (Signed)
Oxygen orders amended to reflect 2 L/min at rest and 3 L/min with exertion and when she is experiencing increased dyspnea.

## 2017-09-04 NOTE — Assessment & Plan Note (Signed)
She tolerated the change to Trelegy, actually prefers it.  We will continue.  We discussed her current oxygen use as she increases to 3 L/min with dyspnea and significant exertion.  I agree with this plan.  No exacerbations.  Flu shot up-to-date  Continue Trelegy 1 inhalation once a day.  Remember to rinse and gargle after using this medication. Keep albuterol available to use 2 puffs if needed for shortness of breath, chest tightness, wheezing. Your oxygen should be used at all times at 2-3 L/min.  Increase to 3 L/min when you are having more shortness of breath or when you are doing significant exertion. Flu shot up-to-date Pneumonia shots up-to-date Follow with Dr Lamonte Sakai in 4 months or sooner if you have any problems

## 2017-09-04 NOTE — Assessment & Plan Note (Signed)
Discussed symptoms with her today.  She has increased nasal drainage especially in the mornings.  She is able to clear it adequately.  I offered to add an additional allergy medication to her regimen  but she believes that the loratadine is adequate.  We will leave the medicines as they are for now.

## 2017-09-04 NOTE — Patient Instructions (Signed)
Continue Trelegy 1 inhalation once a day.  Remember to rinse and gargle after using this medication. Keep albuterol available to use 2 puffs if needed for shortness of breath, chest tightness, wheezing. Your oxygen should be used at all times at 2-3 L/min.  Increase to 3 L/min when you are having more shortness of breath or when you are doing significant exertion. Flu shot up-to-date Pneumonia shots up-to-date Follow with Dr Lamonte Sakai in 4 months or sooner if you have any problems

## 2017-11-26 ENCOUNTER — Telehealth: Payer: Self-pay | Admitting: Emergency Medicine

## 2017-11-26 NOTE — Telephone Encounter (Signed)
Ask  to continue the antibiotic.  Also ask her the name of the antibiotic please. Have her start prednisone Take 40mg  daily for 3 days, then 30mg  daily for 3 days, then 20mg  daily for 3 days, then 10mg  daily for 3 days, then stop Call us if not im[proving.

## 2017-11-26 NOTE — Telephone Encounter (Signed)
Pt has been taking Levaquin since Monday.   Spoke with pt's sister who advised that pt does not tolerate more than 10mg  prednisone daily- worsens anxiety and shakiness.    Pt scheduled to see MW tomorrow as acute pt as pt originally was requesting an OV tomorrow.  Nothing further needed.

## 2017-11-26 NOTE — Telephone Encounter (Signed)
Called and spoke with patients sister, patient is a resident at [facility]. Patient has had a cough for over a week and is now having a hard time breathing. Patient was seen by the doctor at the facility she lives at and the xrays done did not show any pneumonia. Patient has been on an antibiotic but it is not helping. Patients sister states she is worried that she may have pneumonia and that it was just not seen on the xray. There are no openings for tomorrow 3.7.19 RB please advise on this, thanks.

## 2017-11-27 ENCOUNTER — Encounter: Payer: Self-pay | Admitting: Internal Medicine

## 2017-11-27 ENCOUNTER — Ambulatory Visit (INDEPENDENT_AMBULATORY_CARE_PROVIDER_SITE_OTHER): Payer: Medicare Other | Admitting: Internal Medicine

## 2017-11-27 ENCOUNTER — Ambulatory Visit (INDEPENDENT_AMBULATORY_CARE_PROVIDER_SITE_OTHER)
Admission: RE | Admit: 2017-11-27 | Discharge: 2017-11-27 | Disposition: A | Discharge status: Home / Self Care | Payer: Medicare Other | Source: Ambulatory Visit | Attending: Internal Medicine | Admitting: Internal Medicine

## 2017-11-27 VITALS — BP 130/66 | HR 87 | Temp 98.1°F | Ht 65.0 in | Wt 143.0 lb

## 2017-11-27 DIAGNOSIS — J9611 Chronic respiratory failure with hypoxia: Secondary | ICD-10-CM | POA: Diagnosis not present

## 2017-11-27 DIAGNOSIS — I10 Essential (primary) hypertension: Secondary | ICD-10-CM | POA: Diagnosis not present

## 2017-11-27 DIAGNOSIS — J189 Pneumonia, unspecified organism: Secondary | ICD-10-CM

## 2017-11-27 DIAGNOSIS — J439 Emphysema, unspecified: Secondary | ICD-10-CM | POA: Diagnosis not present

## 2017-11-27 DIAGNOSIS — J9612 Chronic respiratory failure with hypercapnia: Secondary | ICD-10-CM | POA: Diagnosis not present

## 2017-11-27 MED ORDER — FLUTTER DEVI: 1.0000 | Freq: Once | 0 refills | Status: DC

## 2017-11-27 MED ORDER — METHYLPREDNISOLONE ACETATE 80 MG/ML IJ SUSP
120.0000 mg | Freq: Once | INTRAMUSCULAR | Status: AC
  Administered 2017-11-27: 120 mg via INTRAMUSCULAR

## 2017-11-27 MED ORDER — FLUTTER DEVI: 1.0000 | Freq: Once | 0 refills | Status: AC

## 2017-11-27 NOTE — Patient Instructions (Signed)
Stop the altace  And start avapro 150 mg daily   Depomedrol 120 mg IM today   Increase the mucinex  1200 mg every 12 hours as needed  Change pepcid to 20 mg twice daily after eating  Please remember to go to the  x-ray department downstairs in the basement  for your tests - we will call you with the results when they are available.      Keep appt with Dr Lamonte Sakai

## 2017-11-27 NOTE — Progress Notes (Signed)
Subjective:    Patient ID: [name], female    DOB: 08/20/1930, 84 y.o.   MRN: [id number]  HPI  ROV  Dr Lamonte Sakai 09/04/17 --follow-up visit for 84 year old woman with a history of tobacco use (60 pack years) and associated COPD with chronic hypoxemic respiratory failure.  She also has a history of breast cancer, hypertension, GERD. She notes that she has ups and downs with her breathing - some days she notes more dyspnea, depending on her exertion. Also feels it when she lays down at night. No pred or abx since last visit. We changed her to Trelegy - she is tolerating, feels that she is benefiting. She has increased nasal gtt and congestion. She uses O2 at 2-3 l/min. She is on loratadine, doesn't feel that she needs any additional allergy meds.  rec Continue Trelegy 1 inhalation once a day.  Remember to rinse and gargle after using this medication. Keep albuterol available to use 2 puffs if needed for shortness of breath, chest tightness, wheezing. Your oxygen should be used at all times at 2-3 L/min.  Increase to 3 L/min when you are having more shortness of breath or when you are doing significant exertion.    11/27/2017 acute extended ov/Wert re:  Sob / cough  maint on trelegy and 02 2-3lpm  Chief Complaint  Patient presents with  . Acute Visit    Increased SOB x 5 days. She is using her albuterol neb 3 x daily.   was doing better on Trelegy with less need for saba then abruptly worse cough/ sensation of throat and chest congestion originally on 11/16/17 rx ? zpak then changed to levaquin 11/21/17 750 x 10d and pred added over the phone 11/26/17 and still not feeling better. Baseline = doe x 25 ft and most of time stays in w/c.  Does not have flutter valve, neb helps only some with sob now at rest but not noct  No increase in 02 need  No obvious day to day or daytime variability or assoc excess/ purulent sputum or mucus plugs or hemoptysis or cp or chest tightness, subjective wheeze or  overt sinus or hb symptoms. No unusual exposure hx or h/o childhood pna/ asthma or knowledge of premature birth.  Sleeping propped up at 30 degrees  without nocturnal  or early am exacerbation  of respiratory  c/o's or need for noct saba. Also denies any obvious fluctuation of symptoms with weather or environmental changes or other aggravating or alleviating factors except as outlined above   Current Allergies, Complete Past Medical History, Past Surgical History, Family History, and Social History were reviewed in [employer] record.  ROS  The following are not active complaints unless bolded Hoarseness, sore throat, dysphagia, dental problems, itching, sneezing,  nasal congestion or discharge of excess mucus or purulent secretions, ear ache,   fever, chills, sweats, unintended wt loss or wt gain, classically pleuritic or exertional cp,  orthopnea pnd or leg swelling, presyncope, palpitations, abdominal pain, anorexia, nausea, vomiting, diarrhea  or change in bowel habits or change in bladder habits, change in stools or change in urine, dysuria, hematuria,  rash, arthralgias, visual complaints, headache, numbness, weakness or ataxia or problems with walking or coordination,  change in mood/affect or memory.        Current Meds  Medication Sig  . acetaminophen (TYLENOL) 325 MG tablet Take 2 tablets (650 mg total) by mouth every 6 (six) hours as needed for mild pain (or Fever >/=  101).  . albuterol (PROVENTIL HFA) 108 (90 BASE) MCG/ACT inhaler Inhale 2 puffs into the lungs every 6 (six) hours as needed.  Marland Kitchen albuterol (PROVENTIL) (2.5 MG/3ML) 0.083% nebulizer solution Take 2.5 mg by nebulization every 6 (six) hours as needed for wheezing or shortness of breath.   . ALPRAZolam (XANAX) 0.25 MG tablet Take 0.25 mg by mouth daily. Give at 2 pm  . ALPRAZolam (XANAX) 0.5 MG tablet Take 0.5 mg by mouth daily. Give at 6 am and 10 pm  . amLODipine (NORVASC) 5 MG tablet TAKE 1 TABLET (5 MG  TOTAL) BY MOUTH DAILY.  Marland Kitchen aspirin 81 MG chewable tablet Chew 81 mg by mouth daily.  Marland Kitchen atorvastatin (LIPITOR) 10 MG tablet Take 1/2 tablet daily for cholesterol   . Cholecalciferol (VITAMIN D3) 2000 units TABS Take 1 tablet by mouth daily at 2 PM.  . docusate sodium (COLACE) 100 MG capsule Take 100 mg by mouth daily. For constipation  . famotidine (PEPCID) 20 MG tablet Take 20 mg by mouth 2 (two) times daily as needed for heartburn or indigestion.  . Fluticasone-Umeclidin-Vilant (TRELEGY ELLIPTA) 100-62.5-25 MCG/INH AEPB Inhale 1 puff into the lungs daily.  Marland Kitchen guaiFENesin (MUCINEX) 600 MG 12 hr tablet Take 600 mg by mouth 2 (two) times daily.  Marland Kitchen levofloxacin (LEVAQUIN) 750 MG tablet Take 1 tablet by mouth daily.  Marland Kitchen loratadine (CLARITIN) 10 MG tablet Take 10 mg by mouth daily.  . ondansetron (ZOFRAN) 4 MG tablet Take 1 tablet (4 mg total) by mouth every 6 (six) hours as needed for nausea.  . OXYGEN Inhale 2 L into the lungs as needed (with exertion to maintain SaO2 at >/ 88%).  . ramipril (ALTACE) 10 MG capsule TAKE 1 CAPSULE (10 MG TOTAL) BY MOUTH DAILY.  . [DISCONTINUED] UNABLE TO FIND Med Name: House Shake  With lunch and dinner per resident request                    Objective:   Physical Exam   W/c bound hoarse wf  With  severe upper airway pattern harsh coughing fits    Wt Readings from Last 3 Encounters:  11/27/17 143 lb (64.9 kg)  09/04/17 144 lb (65.3 kg)  05/22/17 141 lb (64 kg)     Vital signs reviewed - Note on arrival 02 sats  96% on 2lpm    HEENT: nl dentition, turbinates bilaterally, and oropharynx. Nl external ear canals without cough reflex   NECK :  without JVD/Nodes/TM/ nl carotid upstrokes bilaterally   LUNGS: no acc muscle use,  Nl contour chest with insp /exp rhonchi and pseudowheeze as well better with plm    CV:  RRR  no s3 or murmur or increase in P2, and no edema   ABD:  soft and nontender with nl inspiratory excursion in the supine position. No  bruits or organomegaly appreciated, bowel sounds nl  MS:   ext warm without deformities, calf tenderness, cyanosis or clubbing No obvious joint restrictions   SKIN: warm and dry without lesions    NEURO:  alert, approp, nl sensorium with  no motor or cerebellar deficits apparent.     CXR PA and Lateral:   11/27/2017 :    I personally reviewed images and agree with radiology impression as follows:   Small right pleural effusion and basilar airspace disease, likely atelectasis.  Chronic elevation of the right hemidiaphragm and hiatal hernia.  Emphysema.         Assessment & Plan:

## 2017-11-28 ENCOUNTER — Encounter: Payer: Self-pay | Admitting: Internal Medicine

## 2017-11-28 DIAGNOSIS — J9611 Chronic respiratory failure with hypoxia: Secondary | ICD-10-CM | POA: Insufficient documentation

## 2017-11-28 DIAGNOSIS — J9612 Chronic respiratory failure with hypercapnia: Secondary | ICD-10-CM

## 2017-11-28 DIAGNOSIS — J189 Pneumonia, unspecified organism: Secondary | ICD-10-CM | POA: Insufficient documentation

## 2017-11-28 NOTE — Assessment & Plan Note (Signed)
Trial off altace 11/27/2017 for severe upper airway cough while also on dpi   ACE inhibitors are problematic in  pts with airway complaints because  even experienced pulmonologists can't always distinguish ace effects from copd/asthma.  By themselves they don't actually cause a problem, much like oxygen can't by itself start a fire, but they certainly serve as a powerful catalyst or enhancer for any "fire"  or inflammatory process in the upper airway, be it caused by an ET  tube or more commonly reflux (especially in the obese or pts with known GERD or who are on biphoshonates) or DPI's ie gerd, dpi's and acei can be a perfect storm destablizing the upper airway, and she has all 3 active at present    In the era of [facility] near equivalency until we have a better handle on the reversibility of the airway problem, it just makes sense to avoid ACEI  entirely in the short run and then decide later, having established a level of airway control using a reasonable limited regimen, whether to add back ace but even then being very careful to observe the pt for worsening airway control and number of meds used/ needed to control symptoms.    Try avapro 150 mg daily for now and regroup with Dr Lamonte Sakai as planned   I had an extended discussion with the patient reviewing all relevant studies completed to date and  lasting 25 minutes of a 40  minute acute office  visit with pt new to me  re  severe non-specific but potentially very serious  respiratory symptoms of uncertain and potentially multiple  etiologies.   Each maintenance medication was reviewed in detail including most importantly the difference between maintenance and prns and under what circumstances the prns are to be triggered using an action plan format that is not reflected in the computer generated alphabetically organized AVS.    Please see AVS for specific instructions unique to this office visit that I personally wrote and verbalized to the the pt in  detail and then reviewed with pt  by my nurse highlighting any changes in therapy/plan of care  recommended at today's visit.

## 2017-11-28 NOTE — Progress Notes (Signed)
Spoke with the pt's sister and notified of results and she verbalized understanding and will inform the pt.

## 2017-11-28 NOTE — Assessment & Plan Note (Signed)
HC03  11/09/2015  = 32   Adequate control on present rx, reviewed in detail with pt > no change in rx needed

## 2017-11-28 NOTE — Assessment & Plan Note (Signed)
PFT's 1998:  FEV1 1.40 (55%), ratio 50, +airtrapping, DLCO 53%  DDX of  difficult airways management almost all start with A and  include Adherence, Ace Inhibitors, Acid Reflux, Active Sinus Disease, Alpha 1 Antitripsin deficiency, Anxiety masquerading as Airways dz,  ABPA,  Allergy(esp in young), Aspiration (esp in elderly), Adverse effects of meds,  Active smokers, A bunch of PE's (a small clot burden can't cause this syndrome unless there is already severe underlying pulm or vascular dz with poor reserve) plus two Bs  = Bronchiectasis and Beta blocker use..and one C= CHF  Adherence is always the initial "prime suspect" and is a multilayered concern that requires a "trust but verify" approach in every patient - starting with knowing how to use medications, especially inhalers, correctly, keeping up with refills and understanding the fundamental difference between maintenance and prns vs those medications only taken for a very short course and then stopped and not refilled.   ACEi adverse effects at the  top of the usual list of suspects and the only way to rule it out is a trial off > see a/p    ? Allergy/ asthma > continue ICS in form of trelegy for now/ depomedrol 120 mg IM   ? Adverse effects of DPI > harsh upper airway pattern cough can also be from DPI so low threshold if not better to change to hfa or neb   ? Aspiration related to large [place] / reflux  > rx pepcid only for now

## 2017-11-28 NOTE — Assessment & Plan Note (Signed)
Can't excluded pna RLL though exam was not localized and some of these changes are certainly chronic > complete the levaquin and f/u with Dr Lamonte Sakai, low threshold to do swallowing/ esophageal eval next.

## 2017-12-29 ENCOUNTER — Encounter: Payer: Self-pay | Admitting: Emergency Medicine

## 2017-12-29 ENCOUNTER — Ambulatory Visit (INDEPENDENT_AMBULATORY_CARE_PROVIDER_SITE_OTHER): Payer: Medicare Other | Admitting: Emergency Medicine

## 2017-12-29 DIAGNOSIS — K219 Gastro-esophageal reflux disease without esophagitis: Secondary | ICD-10-CM

## 2017-12-29 DIAGNOSIS — R059 Cough, unspecified: Secondary | ICD-10-CM

## 2017-12-29 DIAGNOSIS — J301 Allergic rhinitis due to pollen: Secondary | ICD-10-CM

## 2017-12-29 DIAGNOSIS — J9611 Chronic respiratory failure with hypoxia: Secondary | ICD-10-CM | POA: Diagnosis not present

## 2017-12-29 DIAGNOSIS — J439 Emphysema, unspecified: Secondary | ICD-10-CM | POA: Diagnosis not present

## 2017-12-29 DIAGNOSIS — R05 Cough: Secondary | ICD-10-CM

## 2017-12-29 NOTE — Assessment & Plan Note (Signed)
Multifactorial - she benefited from change ACE-I last visit to [facility]. Treating rhinitis and GERD

## 2017-12-29 NOTE — Assessment & Plan Note (Signed)
Continue Trelegy once a day as you have been taking it.  Remember to rinse and gargle after using. Keep albuterol available to use 2+ if needed for shortness of breath, wheezing, chest tightness. Continue Mucinex as you are taking it.

## 2017-12-29 NOTE — Patient Instructions (Signed)
Continue Trelegy once a day as you have been taking it.  Remember to rinse and gargle after using. Keep albuterol available to use 2+ if needed for shortness of breath, wheezing, chest tightness. Continue Pepcid 20 mg twice a day on a schedule. Continue Mucinex as you are taking it. Continue loratadine (Claritin) as you have been using it. Continue your oxygen at all times as you have been using it. Please start fluticasone nasal spray, 2 sprays each nostril once daily. Follow with Dr Lamonte Sakai in 6 months or sooner if you have any problems

## 2017-12-29 NOTE — Assessment & Plan Note (Signed)
Continue oxygen as ordered 

## 2017-12-29 NOTE — Progress Notes (Signed)
    Subjective:    Patient ID: [name], female    DOB: 06-30-1930, 84 y.o.   MRN: [id number]  HPI  ROV 09/04/17 --follow-up visit for 84 year old woman with a history of tobacco use (60 pack years) and associated COPD with chronic hypoxemic respiratory failure.  She also has a history of breast cancer, hypertension, GERD. She notes that she has ups and downs with her breathing - some days she notes more dyspnea, depending on her exertion. Also feels it when she lays down at night. No pred or abx since last visit. We changed her to Trelegy - she is tolerating, feels that she is benefiting. She has increased nasal gtt and congestion. She uses O2 at 2-3 l/min. She is on loratadine, doesn't feel that she needs any additional allergy meds.   ROV 12/29/17 --follow-up visit 84 for patient with COPD and chronic hypoxic respiratory failure, also with a history of breast cancer, hypertension, GERD.  I have been treating her with Trelegy for the last 4-5 months.  She felt that she was benefiting.  She then had to come in with acute upper airway irritation cough and associated dyspnea about 82 month ago. Dr Melvyn Novas changed her Altace to Avapro, add Mucinex, increased Pepcid to schedule from prn. Her cough is better. She does have to clear some thick mucous most evenings. She uses albuterol about 3x a week. She is on loratadine.     Review of Systems As per HPI     Objective:   Physical Exam Vitals:   12/29/17 1121  BP: 106/60  Pulse: 76  SpO2: 96%  Weight: 139 lb (63 kg)  Height: 5' 5.5" (1.664 m)   Gen: Pleasant, in no distress,  normal affect  ENT: No lesions,  mouth clear,  oropharynx clear, no postnasal drip  Neck: No JVD, no TMG, no carotid bruits  Lungs: No use of accessory muscles, distant, few insp squeaks, exp wheeze.   Cardiovascular: RRR, heart sounds normal, no murmur or gallops, trace ankle edema bilaterally  Musculoskeletal: No deformities, no cyanosis or  clubbing  Neuro: alert, non focal  Skin: Warm, no lesions or rashes      Assessment & Plan:  Chronic respiratory failure with hypoxia (HCC) Continue oxygen as ordered.   COPD (chronic obstructive pulmonary disease) with emphysema (HCC) Continue Trelegy once a day as you have been taking it.  Remember to rinse and gargle after using. Keep albuterol available to use 2+ if needed for shortness of breath, wheezing, chest tightness. Continue Mucinex as you are taking it.  Allergic rhinitis Add Flonase to loratadine  GERD Continue scheduled pepcid  Cough Multifactorial - she benefited from change ACE-I last visit to [facility]. Treating rhinitis and GERD  Baltazar Apo, MD, PhD 12/29/2017, 11:48 AM [facility] [phone] or if no answer [phone]

## 2017-12-29 NOTE — Assessment & Plan Note (Signed)
Add Flonase to loratadine

## 2017-12-29 NOTE — Assessment & Plan Note (Signed)
Continue scheduled pepcid

## 2018-02-17 ENCOUNTER — Non-Acute Institutional Stay: Payer: Self-pay | Admitting: Hospice and Palliative Medicine

## 2018-02-17 ENCOUNTER — Non-Acute Institutional Stay: Payer: Medicare Other | Admitting: Hospice and Palliative Medicine

## 2018-02-17 DIAGNOSIS — Z515 Encounter for palliative care: Secondary | ICD-10-CM

## 2018-02-17 NOTE — Progress Notes (Signed)
PALLIATIVE CARE CONSULT VISIT   PATIENT NAME: [name] DOB: [birth year] MRN: [id number]  PRIMARY CARE PROVIDER:   Blanchie Serve, MD  REFERRING PROVIDER:  Dr. Aubery Lapping  RESPONSIBLE PARTY:   Jim Desanctis - sister - [phone], [phone]  ASSESSMENT: Today, I met with patient. She says that she has become increasingly limited by her breathing status. She is wheelchair bound but can transfer independently from bed/chair/bathroom. However, it takes her a long time to recover from movement such as transferring or dressing. I was able to observe her with her morning routine and she appeared quite short of breath and had difficulty speaking during these movements. Patient says she often now has to increase her O2 from 2L to 3L to compensate. She says she recognizes that the breathing is worsening and that she will likely pass eventually from the COPD. She says she has had multiple family members die from COPD. She says that she is not afraid to die but just wants to make sure that she is comfortable and does not struggle to breathe. We talked about hospice involvement and she thought that was a good idea.    RECOMMENDATIONS and PLAN:  1. Recommend hospice referral 2. Continue alprazolam prn 3. Recommend trial of morphine ('20mg'$ /ml) 0.25-0.'5mg'$  q4h prn for pain/dyspnea 4. Recommend use of bedside fan and cool temps in room 5. Discussed clinical trajectory with patient. Discussed case with attending.  I spent 90 minutes providing this consultation,  from 0800 to 0930. More than 50% of the time in this consultation was spent coordinating communication.   HISTORY OF PRESENT ILLNESS:  [name] is a 84 y.o. year old female with multiple medical problems including O2 dependent COPD (2-3L), h/o breast cancer, HTN, GERD, protein-calorie malnutrition, anxiety, depression, and low back pain. Patient has had a progressive decline over the past few months with worsening shortness of breath with  minimal exertion. Palliative Care was asked to help address goals of care.   CODE STATUS: DNR  PPS: 40% HOSPICE ELIGIBILITY/DIAGNOSIS: YES  PAST MEDICAL HISTORY:  Past Medical History:  Diagnosis Date  . Allergy   . Anxiety   . Arthritis   . Breast cancer ([place]) 04/11/06 bx   left breast invasive cductal ca  . 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 Cancer ([place])   . Cataract    extraction  . Depression   . Emphysema   . Emphysema of lung ([place])   . Esophageal reflux   . Full dentures   . History of radiation therapy 07/01/06-08/13/06   left breast  total dose 6100 cGy  . Hx of radiation therapy 08/31/12-09/28/12   right breast   . On home oxygen therapy    at night  . Osteoporosis, unspecified   . Other and unspecified hyperlipidemia   . Other emphysema ([place])   . PONV (postoperative nausea and vomiting)   . Unspecified asthma(493.90)   . Unspecified essential hypertension   . Wears glasses     SOCIAL HX:  Social History   Tobacco Use  . Smoking status: Former Smoker    Packs/day: 1.50    Years: 43.00    Pack years: 64.50    Types: Cigarettes    Start date: 11/23/1953    Last attempt to quit: 09/24/1995    Years since quitting: 22.4  . Smokeless tobacco: Never Used  . Tobacco comment: quit 16 years ago  Substance Use  Topics  . Alcohol use: No    Alcohol/week: 0.0 oz    ALLERGIES:  Allergies  Allergen Reactions  . Neosporin [Neomycin-Polymyxin B Gu] Itching and Rash  . Codeine Nausea Only  . Irbesartan Hives    Avapro  . Adhesive [Tape] Itching and Rash    To paper tape only. Rash and itching only where applied.     PERTINENT MEDICATIONS:  Outpatient Encounter Medications as of 02/17/2018  Medication Sig  . acetaminophen (TYLENOL) 325 MG tablet Take 2 tablets (650 mg total) by mouth every 6 (six) hours as needed for mild pain (or Fever >/= 101).  Marland Kitchen albuterol (PROVENTIL HFA) 108 (90  BASE) MCG/ACT inhaler Inhale 2 puffs into the lungs every 6 (six) hours as needed.  Marland Kitchen albuterol (PROVENTIL) (2.5 MG/3ML) 0.083% nebulizer solution Take 2.5 mg by nebulization every 6 (six) hours as needed for wheezing or shortness of breath.   . ALPRAZolam (XANAX) 0.25 MG tablet Take 0.25 mg by mouth daily. Give at 2 pm  . ALPRAZolam (XANAX) 0.5 MG tablet Take 0.5 mg by mouth daily. Give at 6 am and 10 pm  . amLODipine (NORVASC) 5 MG tablet TAKE 1 TABLET (5 MG TOTAL) BY MOUTH DAILY.  Marland Kitchen aspirin 81 MG chewable tablet Chew 81 mg by mouth daily.  Marland Kitchen atorvastatin (LIPITOR) 10 MG tablet Take 1/2 tablet daily for cholesterol   . Cholecalciferol (VITAMIN D3) 2000 units TABS Take 1 tablet by mouth daily at 2 PM.  . docusate sodium (COLACE) 100 MG capsule Take 100 mg by mouth daily. For constipation  . famotidine (PEPCID) 20 MG tablet Take 20 mg by mouth 2 (two) times daily as needed for heartburn or indigestion.  . Fluticasone-Umeclidin-Vilant (TRELEGY ELLIPTA) 100-62.5-25 MCG/INH AEPB Inhale 1 puff into the lungs daily.  Marland Kitchen guaiFENesin (MUCINEX) 600 MG 12 hr tablet Take 600 mg by mouth 2 (two) times daily.  Marland Kitchen loratadine (CLARITIN) 10 MG tablet Take 10 mg by mouth daily.  . ondansetron (ZOFRAN) 4 MG tablet Take 1 tablet (4 mg total) by mouth every 6 (six) hours as needed for nausea.  . OXYGEN Inhale 2 L into the lungs as needed (with exertion to maintain SaO2 at >/ 88%).   No facility-administered encounter medications on file as of 02/17/2018.     PHYSICAL EXAM:   General: NAD, frail appearing, thin, temporal wasting Cardiovascular: regular rate and rhythm Pulmonary: poor air movement without wheeze Abdomen: soft, nontender, + bowel sounds GU: no suprapubic tenderness Extremities: no edema, no joint deformities Skin: no rashes Neurological: Weakness but otherwise nonfocal  Irean Hong, NP

## 2018-05-06 ENCOUNTER — Encounter: Payer: Self-pay | Admitting: Internal Medicine

## 2018-06-02 ENCOUNTER — Telehealth: Payer: Self-pay | Admitting: Emergency Medicine

## 2018-06-02 NOTE — Telephone Encounter (Signed)
Thank you very much for letting me know ?

## 2018-06-02 NOTE — Telephone Encounter (Signed)
Called [place], Patient's Sister, no answer.  Left message on VM to call back.

## 2018-06-02 NOTE — Telephone Encounter (Signed)
Called and spoke with patients sister, she stated that the patient is now on hospice and has been given 3 weeks to live. Patients sister states that she will not be coming back for a visit. Will route to RB  For an FYI.

## 2018-08-23 DEATH — deceased
